# Patient Record
Sex: Female | Born: 1945
Health system: Southern US, Community
[De-identification: ages and names within clinical notes are randomized; demographics above are authoritative.]

## PROBLEM LIST (undated history)

## (undated) DIAGNOSIS — G729 Myopathy, unspecified: Secondary | ICD-10-CM

## (undated) DIAGNOSIS — B019 Varicella without complication: Secondary | ICD-10-CM

## (undated) DIAGNOSIS — R7303 Prediabetes: Secondary | ICD-10-CM

## (undated) DIAGNOSIS — N63 Unspecified lump in unspecified breast: Secondary | ICD-10-CM

## (undated) DIAGNOSIS — Z803 Family history of malignant neoplasm of breast: Secondary | ICD-10-CM

## (undated) DIAGNOSIS — J45909 Unspecified asthma, uncomplicated: Secondary | ICD-10-CM

## (undated) DIAGNOSIS — I10 Essential (primary) hypertension: Secondary | ICD-10-CM

## (undated) DIAGNOSIS — Z800 Family history of malignant neoplasm of digestive organs: Secondary | ICD-10-CM

## (undated) DIAGNOSIS — K635 Polyp of colon: Secondary | ICD-10-CM

## (undated) DIAGNOSIS — E039 Hypothyroidism, unspecified: Secondary | ICD-10-CM

## (undated) DIAGNOSIS — M1990 Unspecified osteoarthritis, unspecified site: Secondary | ICD-10-CM

## (undated) DIAGNOSIS — K219 Gastro-esophageal reflux disease without esophagitis: Secondary | ICD-10-CM

## (undated) DIAGNOSIS — Z8042 Family history of malignant neoplasm of prostate: Secondary | ICD-10-CM

## (undated) DIAGNOSIS — N6452 Nipple discharge: Secondary | ICD-10-CM

## (undated) DIAGNOSIS — R61 Generalized hyperhidrosis: Secondary | ICD-10-CM

## (undated) DIAGNOSIS — C801 Malignant (primary) neoplasm, unspecified: Secondary | ICD-10-CM

## (undated) DIAGNOSIS — R233 Spontaneous ecchymoses: Secondary | ICD-10-CM

## (undated) DIAGNOSIS — J309 Allergic rhinitis, unspecified: Secondary | ICD-10-CM

## (undated) DIAGNOSIS — R238 Other skin changes: Secondary | ICD-10-CM

## (undated) DIAGNOSIS — K069 Disorder of gingiva and edentulous alveolar ridge, unspecified: Secondary | ICD-10-CM

## (undated) DIAGNOSIS — R32 Unspecified urinary incontinence: Secondary | ICD-10-CM

## (undated) HISTORY — DX: Family history of malignant neoplasm of digestive organs: Z80.0

## (undated) HISTORY — DX: Hypothyroidism, unspecified: E03.9

## (undated) HISTORY — DX: Spontaneous ecchymoses: R23.3

## (undated) HISTORY — DX: Essential (primary) hypertension: I10

## (undated) HISTORY — DX: Prediabetes: R73.03

## (undated) HISTORY — DX: Family history of malignant neoplasm of prostate: Z80.42

## (undated) HISTORY — DX: Other skin changes: R23.8

## (undated) HISTORY — DX: Nipple discharge: N64.52

## (undated) HISTORY — DX: Varicella without complication: B01.9

## (undated) HISTORY — DX: Unspecified urinary incontinence: R32

## (undated) HISTORY — DX: Family history of malignant neoplasm of breast: Z80.3

## (undated) HISTORY — DX: Generalized hyperhidrosis: R61

## (undated) HISTORY — DX: Unspecified osteoarthritis, unspecified site: M19.90

## (undated) HISTORY — DX: Unspecified lump in unspecified breast: N63.0

## (undated) HISTORY — DX: Allergic rhinitis, unspecified: J30.9

## (undated) HISTORY — DX: Disorder of gingiva and edentulous alveolar ridge, unspecified: K06.9

## (undated) HISTORY — DX: Polyp of colon: K63.5

## (undated) HISTORY — DX: Gastro-esophageal reflux disease without esophagitis: K21.9

## (undated) HISTORY — PX: SKIN TAG REMOVAL: SHX780

---

## 1978-08-03 HISTORY — PX: TUBAL LIGATION: SHX77

## 1984-08-03 HISTORY — PX: ABDOMINAL HYSTERECTOMY: SHX81

## 1999-07-11 ENCOUNTER — Other Ambulatory Visit: Admission: RE | Admit: 1999-07-11 | Discharge: 1999-07-11 | Payer: Self-pay | Admitting: *Deleted

## 2000-01-14 ENCOUNTER — Encounter: Payer: Self-pay | Admitting: Obstetrics and Gynecology

## 2000-01-14 ENCOUNTER — Encounter: Admission: RE | Admit: 2000-01-14 | Discharge: 2000-01-14 | Payer: Self-pay | Admitting: Obstetrics and Gynecology

## 2000-08-18 ENCOUNTER — Other Ambulatory Visit: Admission: RE | Admit: 2000-08-18 | Discharge: 2000-08-18 | Payer: Self-pay | Admitting: Obstetrics and Gynecology

## 2001-01-27 ENCOUNTER — Encounter: Payer: Self-pay | Admitting: Obstetrics and Gynecology

## 2001-01-27 ENCOUNTER — Encounter: Admission: RE | Admit: 2001-01-27 | Discharge: 2001-01-27 | Payer: Self-pay | Admitting: Obstetrics and Gynecology

## 2001-02-21 ENCOUNTER — Encounter: Payer: Self-pay | Admitting: Internal Medicine

## 2001-02-21 ENCOUNTER — Encounter: Admission: RE | Admit: 2001-02-21 | Discharge: 2001-02-21 | Payer: Self-pay | Admitting: Internal Medicine

## 2001-04-18 ENCOUNTER — Encounter (INDEPENDENT_AMBULATORY_CARE_PROVIDER_SITE_OTHER): Payer: Self-pay | Admitting: *Deleted

## 2001-04-18 ENCOUNTER — Ambulatory Visit (HOSPITAL_COMMUNITY): Admission: RE | Admit: 2001-04-18 | Discharge: 2001-04-18 | Payer: Self-pay | Admitting: Gastroenterology

## 2002-03-15 ENCOUNTER — Encounter: Admission: RE | Admit: 2002-03-15 | Discharge: 2002-03-15 | Payer: Self-pay | Admitting: Obstetrics and Gynecology

## 2002-03-15 ENCOUNTER — Encounter: Payer: Self-pay | Admitting: Obstetrics and Gynecology

## 2002-06-05 ENCOUNTER — Encounter: Payer: Self-pay | Admitting: Internal Medicine

## 2002-06-05 ENCOUNTER — Encounter: Admission: RE | Admit: 2002-06-05 | Discharge: 2002-06-05 | Payer: Self-pay | Admitting: Internal Medicine

## 2002-06-06 ENCOUNTER — Encounter: Admission: RE | Admit: 2002-06-06 | Discharge: 2002-06-06 | Payer: Self-pay | Admitting: Internal Medicine

## 2002-06-06 ENCOUNTER — Encounter: Payer: Self-pay | Admitting: Internal Medicine

## 2002-10-26 ENCOUNTER — Other Ambulatory Visit: Admission: RE | Admit: 2002-10-26 | Discharge: 2002-10-26 | Payer: Self-pay | Admitting: Obstetrics and Gynecology

## 2003-07-11 ENCOUNTER — Encounter: Admission: RE | Admit: 2003-07-11 | Discharge: 2003-07-11 | Payer: Self-pay | Admitting: Obstetrics and Gynecology

## 2004-02-26 ENCOUNTER — Encounter: Admission: RE | Admit: 2004-02-26 | Discharge: 2004-02-26 | Payer: Self-pay | Admitting: Otolaryngology

## 2004-08-18 ENCOUNTER — Encounter: Admission: RE | Admit: 2004-08-18 | Discharge: 2004-08-18 | Payer: Self-pay | Admitting: Internal Medicine

## 2005-03-30 ENCOUNTER — Ambulatory Visit (HOSPITAL_COMMUNITY): Admission: RE | Admit: 2005-03-30 | Discharge: 2005-03-30 | Payer: Self-pay | Admitting: Gastroenterology

## 2005-09-07 ENCOUNTER — Encounter: Admission: RE | Admit: 2005-09-07 | Discharge: 2005-09-07 | Payer: Self-pay | Admitting: Obstetrics and Gynecology

## 2006-03-23 ENCOUNTER — Other Ambulatory Visit: Admission: RE | Admit: 2006-03-23 | Discharge: 2006-03-23 | Payer: Self-pay | Admitting: Obstetrics & Gynecology

## 2006-09-27 ENCOUNTER — Encounter: Admission: RE | Admit: 2006-09-27 | Discharge: 2006-09-27 | Payer: Self-pay | Admitting: Internal Medicine

## 2006-10-14 ENCOUNTER — Encounter: Admission: RE | Admit: 2006-10-14 | Discharge: 2006-10-14 | Payer: Self-pay | Admitting: Internal Medicine

## 2008-04-20 ENCOUNTER — Encounter: Admission: RE | Admit: 2008-04-20 | Discharge: 2008-04-20 | Payer: Self-pay | Admitting: Internal Medicine

## 2008-05-11 ENCOUNTER — Encounter: Admission: RE | Admit: 2008-05-11 | Discharge: 2008-05-11 | Payer: Self-pay | Admitting: Radiology

## 2008-07-09 ENCOUNTER — Encounter (INDEPENDENT_AMBULATORY_CARE_PROVIDER_SITE_OTHER): Payer: Self-pay | Admitting: Surgery

## 2008-07-09 ENCOUNTER — Ambulatory Visit (HOSPITAL_COMMUNITY): Admission: RE | Admit: 2008-07-09 | Discharge: 2008-07-09 | Payer: Self-pay | Admitting: Surgery

## 2008-07-09 HISTORY — PX: BREAST SURGERY: SHX581

## 2008-07-18 ENCOUNTER — Emergency Department (HOSPITAL_COMMUNITY): Admission: EM | Admit: 2008-07-18 | Discharge: 2008-07-18 | Payer: Self-pay | Admitting: Emergency Medicine

## 2009-09-05 ENCOUNTER — Ambulatory Visit (HOSPITAL_COMMUNITY): Admission: RE | Admit: 2009-09-05 | Discharge: 2009-09-05 | Payer: Self-pay | Admitting: Surgery

## 2009-09-05 HISTORY — PX: THIGH / KNEE SOFT TISSUE BIOPSY: SUR151

## 2010-01-02 ENCOUNTER — Encounter: Admission: RE | Admit: 2010-01-02 | Discharge: 2010-01-02 | Payer: Self-pay | Admitting: Family Medicine

## 2010-10-02 ENCOUNTER — Encounter: Payer: Self-pay | Admitting: Pulmonary Disease

## 2010-10-02 DIAGNOSIS — M1990 Unspecified osteoarthritis, unspecified site: Secondary | ICD-10-CM | POA: Insufficient documentation

## 2010-10-02 DIAGNOSIS — E039 Hypothyroidism, unspecified: Secondary | ICD-10-CM | POA: Insufficient documentation

## 2010-10-02 DIAGNOSIS — K219 Gastro-esophageal reflux disease without esophagitis: Secondary | ICD-10-CM | POA: Insufficient documentation

## 2010-10-02 DIAGNOSIS — I10 Essential (primary) hypertension: Secondary | ICD-10-CM | POA: Insufficient documentation

## 2010-10-03 ENCOUNTER — Encounter: Payer: Self-pay | Admitting: Pulmonary Disease

## 2010-10-03 ENCOUNTER — Other Ambulatory Visit: Payer: Self-pay | Admitting: Pulmonary Disease

## 2010-10-03 ENCOUNTER — Ambulatory Visit (INDEPENDENT_AMBULATORY_CARE_PROVIDER_SITE_OTHER)
Admission: RE | Admit: 2010-10-03 | Discharge: 2010-10-03 | Disposition: A | Discharge status: Home / Self Care | Payer: 59 | Source: Ambulatory Visit | Attending: Pulmonary Disease | Admitting: Pulmonary Disease

## 2010-10-03 ENCOUNTER — Institutional Professional Consult (permissible substitution) (INDEPENDENT_AMBULATORY_CARE_PROVIDER_SITE_OTHER): Payer: 59 | Admitting: Pulmonary Disease

## 2010-10-03 DIAGNOSIS — R0602 Shortness of breath: Secondary | ICD-10-CM

## 2010-10-09 NOTE — Assessment & Plan Note (Signed)
Summary: pulm consult/dr onaodowan--[facility]   Visit Type:  Initial Consult Copy to:  Onaodowan  Primary Provider/Referring Provider:  Elias Else MD  CC:  Pulmonary consult. PT  c/o sob w/ exertion, occas wheezing, and dry throat in the am. .  History of Present Illness: 65 yo female with dyspnea.  She noticed problems with her breathing for the past 6 months.  She gets wheezing at times and gets winded after walking about 1/4 mile.  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  He does not have a cough or chest congestion.  She does get phlegm in her throat in the morning.  She will get hay fever, and sinus congestion.  She has never had allergy tests.  She quit smoking in 2010.  She has not had PFT's recently.  She had a chest xray in 2011, but none since.    She denies chest tightness or pain.  She denies palpitations.  She gets a rash over her hands, and says this is from eczema.  She denies abdominal pain, hemoptysis, gland swelling, fever, sweats, or weight loss.  She does snore, but is not sure she has any other trouble with her sleep.  There is no history of pneumonia or TB.  She has never been told she has asthma.  She works as a Location manager for [employer].  She is from [place].  She travelled to [place] last year.  She denies animal exposure.  She denies sick exposures.  Spirometry today: FEV1 1.46(78%), FVC 1.89(79%), FEV1% 77.   Current Medications (verified): 1)  Micardis 40 Mg Tabs (Telmisartan) .... Takes 1 1/2 Tablets Once Daily 2)  Synthroid 75 Mcg Tabs (Levothyroxine Sodium) .... Once Daily 3)  Vitamin D 1200 Mg .... Once A Week 4)  Zyrtec Allergy 10 Mg Tabs (Cetirizine Hcl) .... Once Daily 5)  Coq10 100 Mg Caps (Coenzyme Q10) .... Once Daily 6)  Multivitamins  Tabs (Multiple Vitamin) .... Once Daily 7)  Calcium 500 Mg Tabs (Calcium) .... Once Daily 8)  Amino Acid .... Once  Daily  Allergies: 1)  ! Sulfa 2)  ! Neosporin 3)  ! * Hydrogen Peroxide 4)  ! * Ivp Dye 5)  ! 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 quit 2010. 15 cigs a day. started age 89 occupation: machine operator divorced  Review of Systems       The patient complains of shortness of breath with activity, acid heartburn, indigestion, sore throat, nasal congestion/difficulty breathing through nose, and hand/feet swelling.  The patient denies shortness of breath at rest, productive cough, non-productive cough, coughing up blood, chest pain, irregular heartbeats, loss of appetite, weight change, abdominal pain, difficulty swallowing, tooth/dental problems, headaches, sneezing, itching, ear ache, anxiety, depression, joint stiffness or pain, rash, change in color of mucus, and fever.    Vital Signs:  Patient profile:   65 year old female Height:      62 inches Weight:      211.50 pounds BMI:     38.82 O2 Sat:      100 % on Room air Temp:  97.7 degrees F oral Pulse rate:   66 / minute BP sitting:   134 / 80  (left arm) Cuff size:   large  Vitals Entered By: Carver Fila (October 03, 2010 9:33 AM)  O2 Flow:  Room air CC: Pulmonary consult. PT  c/o sob w/ exertion, occas wheezing, dry throat in the am.  Comments meds and allergies updated Phone number updated Mindy Silva  October 03, 2010 9:34 AM    Physical Exam  General:  normal appearance, healthy appearing, and obese.   Eyes:  PERRLA and EOMI, wear glasses Ears:  TMs intact and clear with normal canals Nose:  clear nasal  discharge, no tenderness Mouth:  MP 3, triangular uvula Neck:  no JVD.   Chest Wall:  no deformities noted Lungs:  clear bilaterally to auscultation and percussion Heart:  regular rate and rhythm, S1, S2 without murmurs, rubs, gallops, or clicks Abdomen:  bowel sounds positive; abdomen soft and non-tender without masses, or organomegaly Msk:  no deformity or scoliosis noted with normal posture Pulses:  pulses normal Extremities:  no clubbing, cyanosis, edema, or deformity noted Neurologic:  normal CN II-XII, gait normal, and strength normal.   Skin:  eczematous rash over hands b/l Cervical Nodes:  no significant adenopathy Psych:  alert and cooperative; normal mood and affect; normal attention span and concentration   Impression & Recommendations:  Problem # 1:  DYSPNEA (ICD-786.05)  Potential causes for this could include asthma in setting of seasonal allergies with prior history of tobacco abuse, diastolic dysfunction with history of hypertension, myopathy and deconditioning.  She did not have evidence for obstruction on spirometry today, but did have suggestion of restrictive defect.  Will arrange for full PFT's to further assess.  Will give her an empiric trial of ventolin.  Will get copy of her recent lab work, and then decide what additional tests need to be performed.  She may need Echo to further assess for diastolic dysfunction.  Will arrange for chest xray today.  Her muscle strength on exam today was normal, and my suspicion for respiratory muscle weakness causing her dyspnea is low.  She may need further evaluation for sleep apnea.  There is nothing in her history to suggest thrombo-embolic disease.  Medications Added to Medication List This Visit: 1)  Micardis 40 Mg Tabs (Telmisartan) .... Takes 1 1/2 tablets once daily 2)  Synthroid 75 Mcg Tabs (Levothyroxine sodium) .... Once daily 3)  Vitamin D 1200 Mg  .... Once a week 4)  Zyrtec Allergy 10 Mg Tabs (Cetirizine hcl) ....  Once daily 5)  Coq10 100 Mg Caps (Coenzyme q10) .... Once daily 6)  Multivitamins Tabs (Multiple vitamin) .... Once daily 7)  Calcium 500 Mg Tabs (Calcium) .... Once daily 8)  Amino Acid  .... Once daily 9)  Ventolin Hfa 108 (90 Base) Mcg/act Aers (Albuterol sulfate) .... Two puffs four times per day as needed  Complete Medication List: 1)  Micardis 40 Mg Tabs (Telmisartan) .... Takes 1 1/2 tablets once daily 2)  Synthroid 75 Mcg Tabs (Levothyroxine sodium) .... Once daily 3)  Vitamin D 1200 Mg  .... Once a week 4)  Zyrtec Allergy 10 Mg Tabs (Cetirizine hcl) .... Once daily 5)  Coq10 100 Mg Caps (Coenzyme q10) .... Once daily 6)  Multivitamins Tabs (Multiple vitamin) .... Once daily 7)  Calcium 500 Mg Tabs (Calcium) .... Once daily 8)  Amino Acid  .... Once daily 9)  Ventolin Hfa 108 (90 Base) Mcg/act  Aers (Albuterol sulfate) .... Two puffs four times per day as needed  Other Orders: Consultation Level V (81191) Spirometry w/Graph (94010) Full Pulmonary Function Test (PFT) T-2 View CXR (71020TC)  Patient Instructions: 1)  Will schedule breathing test (PFT) 2)  Chest xray today 3)  Will get medical records from Dr. Nicholos Johns 4)  Ventolin two puffs four times per day as needed  5)  Follow up in 2 to 3 weeks Prescriptions: VENTOLIN HFA 108 (90 BASE) MCG/ACT AERS (ALBUTEROL SULFATE) two puffs four times per day as needed  #1 x 3   Entered and Authorized by:   Coralyn Helling MD   Signed by:   Coralyn Helling MD on 10/03/2010   Method used:   Electronically to        [facility]  [id number] (ret[facility])       [address]       [place], [place]  [address]       Ph: [phone] or [phone]       Fax: [phone]   RxID:   [id number]

## 2010-10-14 NOTE — Letter (Signed)
Summary: [facility]   Imported By: Kassie Mends 10/10/2010 09:59:47  _____________________________________________________________________  External Attachment:    Type:   Image     Comment:   External Document

## 2010-10-19 LAB — COMPREHENSIVE METABOLIC PANEL
AST: 38 U/L — ABNORMAL HIGH (ref 0–37)
BUN: 13 mg/dL (ref 6–23)
CO2: 29 mEq/L (ref 19–32)
Chloride: 101 mEq/L (ref 96–112)
Creatinine, Ser: 0.98 mg/dL (ref 0.4–1.2)
GFR calc non Af Amer: 58 mL/min — ABNORMAL LOW (ref >60)
Glucose, Bld: 97 mg/dL (ref 70–99)
Total Bilirubin: 1 mg/dL (ref 0.3–1.2)

## 2010-10-19 LAB — CBC
HCT: 44.2 % (ref 36.0–46.0)
Hemoglobin: 14.8 g/dL (ref 12.0–15.0)
MCV: 93.7 fL (ref 78.0–100.0)
RBC: 4.71 MIL/uL (ref 3.87–5.11)
WBC: 5.9 10*3/uL (ref 4.0–10.5)

## 2010-10-20 ENCOUNTER — Ambulatory Visit (INDEPENDENT_AMBULATORY_CARE_PROVIDER_SITE_OTHER): Payer: 59 | Admitting: Pulmonary Disease

## 2010-10-20 ENCOUNTER — Encounter: Payer: Self-pay | Admitting: Pulmonary Disease

## 2010-10-20 DIAGNOSIS — R0602 Shortness of breath: Secondary | ICD-10-CM

## 2010-10-24 ENCOUNTER — Telehealth: Payer: Self-pay | Admitting: Pulmonary Disease

## 2010-10-24 NOTE — Telephone Encounter (Signed)
Forwarded to Dr. Sood for review. °

## 2010-10-30 ENCOUNTER — Ambulatory Visit (INDEPENDENT_AMBULATORY_CARE_PROVIDER_SITE_OTHER): Payer: 59 | Admitting: Pulmonary Disease

## 2010-10-30 DIAGNOSIS — R0602 Shortness of breath: Secondary | ICD-10-CM

## 2010-10-30 LAB — PULMONARY FUNCTION TEST

## 2010-10-30 NOTE — Progress Notes (Signed)
PFT done today. 

## 2010-10-30 NOTE — Assessment & Plan Note (Signed)
Summary: 2-3 WEEK FOLLOW-UP/LED   Copy to:  [place]  Primary Provider/Referring Provider:  Elias Else MD  CC:  follow up. Pt states her breathing has unchanged. Pt states she could here herself wheezing yesterday. Marland Kitchen  History of Present Illness: 65 yo female former smoker with dyspnea.  Her breathing is about the same.  She gets occasional wheeze, but has not been using her inhaler.  She does not have much cough. Her sinuses are doing okay.  She gets occasional leg swelling.  Chest xray from last visit was unremarkable.  She did not have PFT done.  Have not received medical records from Dr. Benjaman Pott office yet.  Current Medications (verified): 1)  Micardis 40 Mg Tabs (Telmisartan) .... Takes 1 1/2 Tablets Once Daily 2)  Synthroid 75 Mcg Tabs (Levothyroxine Sodium) .... Once Daily 3)  Vitamin D 1200 Mg .... Once A Week 4)  Zyrtec Allergy 10 Mg Tabs (Cetirizine Hcl) .... Once Daily 5)  Coq10 100 Mg Caps (Coenzyme Q10) .... Once Daily 6)  Multivitamins  Tabs (Multiple Vitamin) .... Once Daily 7)  Calcium 500 Mg Tabs (Calcium) .... Once Daily 8)  Amino Acid .... Once Daily 9)  Ventolin Hfa 108 (90 Base) Mcg/act Aers (Albuterol Sulfate) .... Two Puffs Four Times Per Day As Needed  Allergies (verified): 1)  ! Sulfa 2)  ! Neosporin 3)  ! * Hydrogen Peroxide 4)  ! * Ivp Dye 5)  ! * Latex  Past History:  Past Medical History: Reviewed history from 10/03/2010 and no changes required. 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 hysterectomy tubal ligation Muscle biopsy Right breast mass >> benign  Vital Signs:  Patient profile:   65 year old female Height:      62 inches Weight:      209.13 pounds BMI:     38.39 O2 Sat:      100 % on Room air Temp:     97.5 degrees F oral Pulse rate:   66 / minute BP sitting:   122 / 76  (right  arm) Cuff size:   large  Vitals Entered By: Carver Fila (October 20, 2010 9:56 AM)  O2 Flow:  Room air CC: follow up. Pt states her breathing has unchanged. Pt states she could here herself wheezing yesterday.  Comments meds and allergies updated Phone number updated Carver Fila  October 20, 2010 9:59 AM    Physical Exam  General:  normal appearance, healthy appearing, and obese.   Nose:  clear nasal discharge, no tenderness Mouth:  MP 3, triangular uvula Neck:  no JVD.   Lungs:  clear bilaterally to auscultation and percussion Heart:  regular rate and rhythm, S1, S2 without murmurs, rubs, gallops, or clicks Extremities:  no clubbing, cyanosis, edema, or deformity noted Neurologic:  normal CN II-XII and strength normal.   Cervical Nodes:  no significant adenopathy   Impression & Recommendations:  Problem # 1:  DYSPNEA (ICD-786.05)  Potential causes for this could include asthma in setting of seasonal allergies with prior history of tobacco abuse, diastolic dysfunction with history of hypertension, and deconditioning.  She did not get her PFT done yet.  Will reschedule this.  She has not tried using her inhaler yet, but I have advised her to try using this more.  I see if we can get her records from PCP office sent.    If her pulmonary  eval is unrevealing for a cause of her dyspnea, then she may need further cardiac evaluation.  Complete Medication List: 1)  Micardis 40 Mg Tabs (Telmisartan) .... Takes 1 1/2 tablets once daily 2)  Synthroid 75 Mcg Tabs (Levothyroxine sodium) .... Once daily 3)  Vitamin D 1200 Mg  .... Once a week 4)  Zyrtec Allergy 10 Mg Tabs (Cetirizine hcl) .... Once daily 5)  Coq10 100 Mg Caps (Coenzyme q10) .... Once daily 6)  Multivitamins Tabs (Multiple vitamin) .... Once daily 7)  Calcium 500 Mg Tabs (Calcium) .... Once daily 8)  Amino Acid  .... Once daily 9)  Ventolin Hfa 108 (90 Base) Mcg/act Aers (Albuterol sulfate) .... Two puffs four times per day  as needed  Other Orders: Est. Patient Level III (21308)  Patient Instructions: 1)  Try using ventolin when you get wheezing 2)  Will schedule breathing test (PFT) 3)  Follow up in 2 months

## 2010-12-16 NOTE — Op Note (Signed)
Lisa Horton, Lisa Horton               ACCOUNT NO.:  [id number]   MEDICAL RECORD NO.:  [id number]          PATIENT TYPE:  AMB   LOCATION:  SDS                          FACILITY:  [facility]   PHYSICIAN:  Thomas A. Cornett, M.D.DATE OF BIRTH:  11/12/1945   DATE OF PROCEDURE:  07/09/2008  DATE OF DISCHARGE:  07/09/2008                               OPERATIVE REPORT   PREOPERATIVE DIAGNOSIS:  Right breast mass.   POSTOPERATIVE DIAGNOSIS:  Right breast mass.   PROCEDURE:  Right breast needle-localized lumpectomy.   SURGEON:  Dortha Schwalbe, MD   ASSISTANT:  Russella Dar, NP   ANESTHESIA:  LMA 0.25% Sensorcaine local with epinephrine.   ESTIMATED BLOOD LOSS:  20 mL.   SPECIMEN:  Right breast tissue with localizing wire and clip, verified  by the Radiologist to be adequate.   DRAINS:  None.   INDICATIONS OF THE PROCEDURE:  The patient is a 65 year old female that  had a core biopsy done of a mass by Dr. Tor Netters of Radiology.  This came  in as a papular lesion and excision was recommended for further  diagnosis.  She presented today for needle-localized right breast  excisional lumpectomy.   DESCRIPTION OF PROCEDURE:  After undergoing needle localization with the  Radiologist, the patient was brought to the operating suite.  After  induction of LMA anesthesia, the right breast was prepped and draped in  sterile fashion.  The wire exited the breast in the right lower outer  quadrant.  A curvilinear incision was made with 0.25% Sensorcaine into  the skin.  Wire was brought out of the incision and all tissue around  the wire was excised.  Of note, there was significant fibrocystic change  in how we did remove part of the breast cyst with this area.  Radiograph  revealed the clip and wire to be in specimen.  Irrigation was used.  Hemostasis was achieved with cautery.  We then  closed the wound in layers, the deep layer with 3-0 Vicryl and a  subsequent 4-0 Monocryl stitch.  Dermabond  was applied.  All final  counts of sponge, needle and instruments found to be correct at this  portion of the case.  The patient was then extubated and taken to the  recovery room in satisfactory condition.      Thomas A. Cornett, M.D.  Electronically Signed     TAC/MEDQ  D:  07/09/2008  T:  07/10/2008  Job:  [id number]   cc:   Candyce Churn. Allyne Gee, M.D.  Lum Keas, MD

## 2010-12-19 NOTE — Op Note (Signed)
Lisa Horton, Lisa Horton               ACCOUNT NO.:  [id number]   MEDICAL RECORD NO.:  [id number]          PATIENT TYPE:  AMB   LOCATION:  ENDO                         FACILITY:  [facility]   PHYSICIAN:  Anselmo Rod, M.D.  DATE OF BIRTH:  1946/04/09   DATE OF PROCEDURE:  03/30/2005  DATE OF DISCHARGE:                                 OPERATIVE REPORT   PROCEDURE PERFORMED:  Screening colonoscopy.   ENDOSCOPIST:  Charna Elizabeth, M.D.   INSTRUMENT USED:  Olympus video colonoscope.   INDICATIONS FOR PROCEDURE:  The patient is a 65 year old African American  female with personal history of a tubular adenoma removed in the past.  Undergoing repeat colonoscopy to rule out recurrent polyps.   PREPROCEDURE PREPARATION:  Informed consent was procured from the patient.  The patient was fasted for eight hours prior to the procedure and prepped  with a bottle of magnesium citrate and a gallon of GoLYTELY the night prior  to the procedure.  The risks and benefits of the procedure including a 10%  miss rate for colon polyps or cancers was discussed with the patient as  well.   PREPROCEDURE PHYSICAL:  The patient had stable vital signs.  Neck supple.  Chest clear to auscultation.  S1 and S2 regular.  Abdomen soft with normal  bowel sounds.   DESCRIPTION OF PROCEDURE:  The patient was placed in left lateral decubitus  position and sedated with 75 mg of Demerol and 7.5 mg of Versed in slow  incremental doses.  Once the patient was adequately sedated and maintained  on low flow oxygen and continuous cardiac monitoring, the Olympus video  colonoscope was advanced from the rectum to the cecum.  The appendicular  orifice and ileocecal valve were clearly visualized and photographed.  No  masses, polyps, erosions, ulcerations or diverticula were seen.  Retroflexion in the rectum revealed no abnormalities. The patient tolerated  the procedure well without complication.   IMPRESSION:  Normal colonoscopy up to  the cecum.   RECOMMENDATIONS:  1.  Continue high fiber diet with liberal fluid intake.  2.  Repeat colonoscopy in the next five years unless the patient develops      any abnormal symptoms in the interim.  3.  Outpatient followup as need arises in the future.      Anselmo Rod, M.D.  Electronically Signed     JNM/MEDQ  D:  03/30/2005  T:  03/30/2005  Job:  [id number]   cc:   Candyce Churn. Allyne Gee, M.D.  [address]  [address]  [place]  [place] [address]  Fax: [phone]

## 2010-12-19 NOTE — Procedures (Signed)
[place]. [facility]  Patient:    Lisa Horton, Lisa Horton Visit Number: [id number] MRN: [id number]          Service Type: END Location: ENDO Attending Physician:  Charna Elizabeth Dictated by:   Anselmo Rod, M.D. Proc. Date: 04/18/01 Admit Date:  04/18/2001   CC:         Velna Hatchet, M.D.   Procedure Report  DATE OF BIRTH:  04-19-1946  REFERRING PHYSICIAN:  Velna Hatchet, M.D.  PROCEDURE PERFORMED:  Esophagogastroduodenoscopy with biopsies.  ENDOSCOPIST:  Anselmo Rod, M.D.  INSTRUMENT USED:  Olympus video panendoscope.  INDICATIONS FOR PROCEDURE:  Epigastric pain and guaiac positive stools in a 65 year old African-American female rule out peptic ulcer disease, esophagitis, gastritis, etc.  PREPROCEDURE PREPARATION:  Informed consent was procured from the patient. The patient was fasted for eight hours prior to the procedure.  PREPROCEDURE PHYSICAL:  The patient had stable vital signs.  Neck supple. Chest clear to auscultation.  S1, S2 regular.  Abdomen soft with normal abdominal bowel sounds.  DESCRIPTION OF PROCEDURE:  The patient was placed in left lateral decubitus position and sedated with 50 mg of Demerol and 5 mg of Versed intravenously. Once the patient was adequately sedated and maintained on low-flow oxygen and continuous cardiac monitoring, the Olympus video panendoscope was advanced through the mouthpiece, over the tongue, into the esophagus under direct vision.  The entire esophagus appeared normal without evidence of ring, stricture, masses, lesions, esophagitis or Barretts mucosa.  The scope was then advanced into the stomach.  There was mild antral gastritis.  No ulcers, masses or polyps were seen.  The proximal small bowel appeared normal. IMPRESSION:  Mild antral gastritis.  Otherwise normal EGD.  RECOMMENDATION:  Proceed with a colonoscopy at this time. Dictated by:   Anselmo Rod, M.D. Attending Physician:  Charna Elizabeth DD:  04/18/01 TD:  04/19/01 Job: [id number]

## 2010-12-19 NOTE — Procedures (Signed)
[place]. [facility]  Patient:    Lisa Horton, Lisa Horton Visit Number: [id number] MRN: [id number]          Service Type: END Location: ENDO Attending Physician:  Charna Elizabeth Dictated by:   Anselmo Rod, M.D. Proc. Date: 04/18/01 Admit Date:  04/18/2001   CC:         Velna Hatchet, M.D.   Procedure Report  DATE OF BIRTH:  08-27-1945  REFERRING PHYSICIAN:  Velna Hatchet, M.D.  PROCEDURE PERFORMED:  Colonoscopy with hot biopsy x 1.  ENDOSCOPIST:  Anselmo Rod, M.D.  INSTRUMENT USED:  Olympus video colonoscope.  INDICATIONS FOR PROCEDURE:  Guaiac positive stools in a 65 year old African-American female.  Rule out colonic polyps, masses, hemorrhoids, etc.  PREPROCEDURE PREPARATION:  Informed consent was procured from the patient. The patient was fasted for eight hours prior to the procedure and prepped with a bottle of magnesium citrate and a gallon of NuLytely the night prior to the procedure.  PREPROCEDURE PHYSICAL:  The patient had stable vital signs.  Neck supple. Chest clear to auscultation.  S1, S2 regular.  Abdomen soft with normal abdominal bowel sounds.  DESCRIPTION OF PROCEDURE:  The patient was placed in the left lateral decubitus position and sedated with an additional 20 mg of Demerol and 2 mg of Versed intravenously.  Once the patient was adequately sedated and maintained on low-flow oxygen and continuous cardiac monitoring, the Olympus video colonoscope was advanced from the rectum to the cecum without difficulty.  A small flat polyp was biopsied at 50 cm.  No large masses were seen.  There were small internal hemorrhoids appreciated on retroflexion.  The rest of the colonic mucosa appeared healthy.  IMPRESSION: 1. One small flat polyp biopsied up 50 cm with hot biopsy forceps. 2. Small internal hemorrhoids. 3. Otherwise healthy-appearing colon.  RECOMMENDATIONS: 1. Await pathology results. 2. A high fiber diet. 2.  Repeat guaiac testing on an outpatient basis.Dictated by:   Anselmo Rod, M.D. Attending Physician:  Charna Elizabeth DD:  04/18/01 TD:  04/19/01 Job: [id number]

## 2010-12-25 ENCOUNTER — Other Ambulatory Visit: Payer: Self-pay | Admitting: Gastroenterology

## 2010-12-26 ENCOUNTER — Ambulatory Visit
Admission: RE | Admit: 2010-12-26 | Discharge: 2010-12-26 | Disposition: A | Discharge status: Home / Self Care | Payer: 59 | Source: Ambulatory Visit | Attending: Gastroenterology | Admitting: Gastroenterology

## 2010-12-31 ENCOUNTER — Encounter: Payer: Self-pay | Admitting: Pulmonary Disease

## 2011-01-06 ENCOUNTER — Encounter: Payer: Self-pay | Admitting: Pulmonary Disease

## 2011-01-19 ENCOUNTER — Other Ambulatory Visit: Payer: Self-pay | Admitting: Plastic Surgery

## 2011-02-09 ENCOUNTER — Other Ambulatory Visit: Payer: Self-pay | Admitting: Gastroenterology

## 2011-02-16 ENCOUNTER — Ambulatory Visit
Admission: RE | Admit: 2011-02-16 | Discharge: 2011-02-16 | Disposition: A | Discharge status: Home / Self Care | Payer: 59 | Source: Ambulatory Visit | Attending: Gastroenterology | Admitting: Gastroenterology

## 2011-02-23 ENCOUNTER — Encounter (INDEPENDENT_AMBULATORY_CARE_PROVIDER_SITE_OTHER): Payer: Self-pay

## 2011-02-24 ENCOUNTER — Ambulatory Visit (INDEPENDENT_AMBULATORY_CARE_PROVIDER_SITE_OTHER): Payer: 59 | Admitting: General Surgery

## 2011-02-24 ENCOUNTER — Encounter (INDEPENDENT_AMBULATORY_CARE_PROVIDER_SITE_OTHER): Payer: Self-pay | Admitting: General Surgery

## 2011-02-24 VITALS — BP 130/84 | HR 68 | Temp 96.5°F | Ht 62.25 in | Wt 208.4 lb

## 2011-02-24 DIAGNOSIS — K8019 Calculus of gallbladder with other cholecystitis with obstruction: Secondary | ICD-10-CM | POA: Insufficient documentation

## 2011-02-24 NOTE — Progress Notes (Signed)
Lisa Horton is a 65 y.o. female.    Chief Complaint  Patient presents with  . Other    new pt- eval gb with stones    HPI HPI The patient has had known gallbladder disease or least 20 years. This is dated back to 71. Recently she had some blood work done for an insurance examination which demonstrated abnormal liver function tests. From her primary care physician she was referred back to her gastroenterologist who performed a colonoscopy Dr. Charna Elizabeth. Dr. Memory Argue suspected possible gallbladder disease and therefore an ultrasound ordered. This demonstrated gallstones but no evidence of acute cholecystitis and a normal common bile duct.  In discussing this with the patient she's had intermittent abdominal pain in the epigastrium and right upper quadrant associated with no fevers and chills. She has had on occasion some dark colored urine and very light stools possibly secondary to a partially and intermittently obstructed common bile duct. She now comes in for evaluation for surgical treatmen  Past Medical History  Diagnosis Date  . Acid reflux   . Breast lump   . Chicken pox   . Measles   . Sinusitis   . Colon polyp   . Hypertension   . Arthritis   . Nipple discharge   . Night sweats   . Gum disease   . Thyroid disease     hypothyroidism  . Incontinence   . Bruises easily     Past Surgical History  Procedure Date  . Tubal ligation   . Abdominal hysterectomy   . Skin tag removal     brow and lid  . Thigh / knee soft tissue biopsy   . Breast surgery     mass removal    Family History  Problem Relation Age of Onset  . Hypertension Mother   . Cancer Mother     stomach  . Hypertension Father   . Cancer Father     prostate  . Hypertension Sister   . Hypertension Brother   . Hypertension Sister     Social History History  Substance Use Topics  . Smoking status: Former Games developer  . Smokeless tobacco: Not on file  . Alcohol Use: Yes    Allergies  Allergen  Reactions  . Adhesive (Tape)   . Hydrogen Peroxide   . Latex   . Sulfonamide Derivatives   . Triple Antibiotic     Current Outpatient Prescriptions  Medication Sig Dispense Refill  . CALCIUM PO Take 1 tablet by mouth daily.        . cetirizine (ZYRTEC) 10 MG tablet Take 10 mg by mouth daily.        . Esomeprazole Magnesium (NEXIUM PO) Take 40 mg by mouth daily.       . Levothyroxine Sodium (SYNTHROID PO) Take 75 mg by mouth daily.       . Multiple Vitamin (MULTIVITAMIN) tablet Take 1 tablet by mouth daily.        . Olmesartan Medoxomil (BENICAR PO) Take 40 mg by mouth daily.       . Coenzyme Q10 (CO Q 10 PO) Take by mouth.        Marland Kitchen PROPOFOL IV Inject 140 mg into the vein.          Review of Systems Review of Systems  Constitutional: Negative for fever, chills and weight loss.  Gastrointestinal: Positive for nausea and abdominal pain (epigastrium and RUQ). Negative for vomiting and melena.    Physical Exam Physical Exam  Constitutional: She is oriented to person, place, and time. She appears well-developed and well-nourished.  HENT:  Head: Normocephalic and atraumatic.  Eyes: Conjunctivae and EOM are normal. Pupils are equal, round, and reactive to light. No scleral icterus.  Neck: Normal range of motion. Neck supple.  Cardiovascular: Normal rate, regular rhythm and normal heart sounds.   Respiratory: Effort normal and breath sounds normal.  GI: Soft. She exhibits no mass. There is tenderness (mild epigastic and RUQ tenderness). There is no rebound and no guarding.  Musculoskeletal: Normal range of motion.  Neurological: She is alert and oriented to person, place, and time.  Skin: Skin is warm and dry.  Psychiatric: She has a normal mood and affect. Her behavior is normal. Judgment and thought content normal.     Blood pressure 130/84, pulse 68, temperature 96.5 F (35.8 C), height 5' 2.25" (1.581 m), weight 208 lb 6.4 oz (94.53 kg).  Assessment/Plan Based on the  patient's presentation, ultrasound report, she has symptomatic cholelithiasis.  A talk with the patient in detail about the risk and benefits of surgery and she wished to proceed as possible. Quinita Kostelecky III,Rhonin Trott O 02/24/2011, 11:43 AM

## 2011-03-03 ENCOUNTER — Encounter (INDEPENDENT_AMBULATORY_CARE_PROVIDER_SITE_OTHER): Payer: Self-pay

## 2011-03-10 ENCOUNTER — Encounter (HOSPITAL_COMMUNITY)
Admission: RE | Admit: 2011-03-10 | Discharge: 2011-03-10 | Disposition: A | Discharge status: Home / Self Care | Payer: 59 | Source: Ambulatory Visit | Attending: General Surgery | Admitting: General Surgery

## 2011-03-10 LAB — DIFFERENTIAL
Basophils Relative: 1 % (ref 0–1)
Monocytes Absolute: 0.3 10*3/uL (ref 0.1–1.0)
Monocytes Relative: 6 % (ref 3–12)
Neutro Abs: 2.7 10*3/uL (ref 1.7–7.7)

## 2011-03-10 LAB — COMPREHENSIVE METABOLIC PANEL
ALT: 28 U/L (ref 0–35)
Alkaline Phosphatase: 64 U/L (ref 39–117)
BUN: 12 mg/dL (ref 6–23)
CO2: 28 mEq/L (ref 19–32)
Calcium: 9.8 mg/dL (ref 8.4–10.5)
GFR calc Af Amer: >60 mL/min (ref >60)
GFR calc non Af Amer: 55 mL/min — ABNORMAL LOW (ref >60)
Glucose, Bld: 103 mg/dL — ABNORMAL HIGH (ref 70–99)
Potassium: 4.1 mEq/L (ref 3.5–5.1)
Sodium: 141 mEq/L (ref 135–145)

## 2011-03-10 LAB — SURGICAL PCR SCREEN: MRSA, PCR: NEGATIVE

## 2011-03-10 LAB — CBC
HCT: 40.1 % (ref 36.0–46.0)
Hemoglobin: 13.2 g/dL (ref 12.0–15.0)
MCH: 30.4 pg (ref 26.0–34.0)
RBC: 4.34 MIL/uL (ref 3.87–5.11)

## 2011-03-13 ENCOUNTER — Encounter (INDEPENDENT_AMBULATORY_CARE_PROVIDER_SITE_OTHER): Payer: Self-pay

## 2011-03-17 ENCOUNTER — Encounter (INDEPENDENT_AMBULATORY_CARE_PROVIDER_SITE_OTHER): Payer: Self-pay

## 2011-03-17 ENCOUNTER — Ambulatory Visit (HOSPITAL_COMMUNITY)
Admission: RE | Admit: 2011-03-17 | Discharge: 2011-03-17 | Disposition: A | Discharge status: Home / Self Care | Payer: 59 | Source: Ambulatory Visit | Attending: General Surgery | Admitting: General Surgery

## 2011-03-17 ENCOUNTER — Other Ambulatory Visit (INDEPENDENT_AMBULATORY_CARE_PROVIDER_SITE_OTHER): Payer: Self-pay | Admitting: General Surgery

## 2011-03-17 DIAGNOSIS — J449 Chronic obstructive pulmonary disease, unspecified: Secondary | ICD-10-CM | POA: Insufficient documentation

## 2011-03-17 DIAGNOSIS — K8010 Calculus of gallbladder with chronic cholecystitis without obstruction: Secondary | ICD-10-CM | POA: Insufficient documentation

## 2011-03-17 DIAGNOSIS — E669 Obesity, unspecified: Secondary | ICD-10-CM | POA: Insufficient documentation

## 2011-03-17 DIAGNOSIS — J4489 Other specified chronic obstructive pulmonary disease: Secondary | ICD-10-CM | POA: Insufficient documentation

## 2011-03-17 DIAGNOSIS — Z01812 Encounter for preprocedural laboratory examination: Secondary | ICD-10-CM | POA: Insufficient documentation

## 2011-03-17 DIAGNOSIS — I10 Essential (primary) hypertension: Secondary | ICD-10-CM | POA: Insufficient documentation

## 2011-03-17 DIAGNOSIS — Z01810 Encounter for preprocedural cardiovascular examination: Secondary | ICD-10-CM | POA: Insufficient documentation

## 2011-03-17 DIAGNOSIS — K219 Gastro-esophageal reflux disease without esophagitis: Secondary | ICD-10-CM | POA: Insufficient documentation

## 2011-03-17 HISTORY — PX: CHOLECYSTECTOMY: SHX55

## 2011-03-19 ENCOUNTER — Telehealth (INDEPENDENT_AMBULATORY_CARE_PROVIDER_SITE_OTHER): Payer: Self-pay | Admitting: General Surgery

## 2011-03-24 NOTE — Op Note (Signed)
Lisa Horton, Lisa Horton               ACCOUNT NO.:  [id number]  MEDICAL RECORD NO.:  [id number]  LOCATION:  [facility]                         FACILITY:  [facility]  PHYSICIAN:  Cherylynn Ridges, M.D.    DATE OF BIRTH:  [birth year]  DATE OF PROCEDURE:  03/17/2011 DATE OF DISCHARGE:                              OPERATIVE REPORT   PREOPERATIVE DIAGNOSIS:  Symptomatic cholelithiasis.  POSTOPERATIVE DIAGNOSIS:  Symptomatic cholelithiasis.  PROCEDURE:  Laparoscopic cholecystectomy.  SURGEON:  Cherylynn Ridges, MD  ANESTHESIA:  General endotracheal.  ESTIMATED BLOOD LOSS:  Less than 20 mL.  COMPLICATIONS:  None.  CONDITION:  Stable.  FINDINGS:  Chronic cholecystitis and intraoperative cholangiogram was not performed.  Anatomy was well seen.  There were some debris at the cut edge of the cystic duct.  INDICATIONS FOR OPERATION:  The patient is a 65 year old female with known gallstones and symptoms related to gallstones who now comes in for an elective laparoscopic cholecystectomy.  OPERATION:  The patient was taken to the operating room and placed on table in supine position.  After an adequate general endotracheal anesthetic was administered, she was prepped and draped in usual sterile manner exposing the midline.  A supraumbilical midline incision was made using #15 blade taken down to the midline fascia.  We grabbed the edges of the fascia with Kocher clamps, then incised between the fascia using 15 blade.  We grabbed the edges and then tented up as we bluntly dissected down into the peritoneal cavity with Kelly clamp.  Once this was done, a pursestring suture of 0 Vicryl was passed around the fascial opening, then we passed a Hasson cannula into the peritoneal cavity.  We were able to insufflate carbon dioxide gas up to a maximal intra-abdominal pressure of 15 mmHg. Once this was done, we inserted the cam with the attached camera light source and found that there were some omental  adhesions around the cannula.  We were subsequently able to place the patient in reverse Trendelenburg position with left side tilted down.  We placed two right upper quadrant 5-mm cannulas.  A 10-11 mm subxiphoid cannula was passed and we subsequently put the camera up into that port inspected down towards the umbilicus and saw the omental wrappings around the cannula. We were able to get the cannula unwrapped around the omentum.  There was no bleeding.  We subsequently went ahead and dissected out the gallbladder.  We retracted the gallbladder towards the anterior abdominal wall in the right upper quadrant.  Because of the patient's shortened torso, we had a small space to work in, however, we were able to retract the gallbladder which was somewhat intrahepatic.  We dissected out the peritoneum overlying the triangle of Calot and hepatoduodenal triangle, could clearly see the cystic duct and the cystic artery.  360-degree circumferential critical windows were dissected away.  We were able to see around the cystic duct and the artery.  We clipped the artery proximally and distally times two and then we also clipped the cystic duct distally x3 and proximally x1 and transected both.  At the cut edge of the cystic duct, were some debris impregnated stones.  We had to dissect  out the gallbladder with minimal difficulty although there was some spillage of bile through the process.  We retrieved the dissected out gallbladder from the supraumbilical site with an EndoCatch bag. There was no bleeding from the gallbladder bed.  The small amount of bile that was spilled was irrigated out with saline solution.  We aspirated all fluid and gas from above the liver and then removed all cannulas.  The supraumbilical fascial site was closed using a pursestring suture which was in place.  We injected 0.25% Marcaine with epi at all sites. We then closed the supraumbilical and subxiphoid skin sites  using running subcuticular stitch of 4-0 Monocryl.  All counts were correct including needles, sponges and instruments.  We used Dermabond, Steri- Strips and Tegaderm to complete all dressings.  It should be said that prior to procedure, a proper time-out was performed identifying the patient and the procedure to be performed.     Cherylynn Ridges, M.D.     JOW/MEDQ  D:  03/17/2011  T:  03/17/2011  Job:  [id number]  cc:   __________Dr. Fulton Mole  Electronically Signed by Jimmye Norman M.D. on 03/24/2011 12:04:04 AM

## 2011-04-07 ENCOUNTER — Encounter (INDEPENDENT_AMBULATORY_CARE_PROVIDER_SITE_OTHER): Payer: Self-pay | Admitting: General Surgery

## 2011-04-07 ENCOUNTER — Ambulatory Visit (INDEPENDENT_AMBULATORY_CARE_PROVIDER_SITE_OTHER): Payer: 59 | Admitting: General Surgery

## 2011-04-07 VITALS — BP 142/86 | HR 80

## 2011-04-07 DIAGNOSIS — Z09 Encounter for follow-up examination after completed treatment for conditions other than malignant neoplasm: Secondary | ICD-10-CM

## 2011-04-07 NOTE — Progress Notes (Signed)
HPI The patient comes in status post laparoscopic cholecystectomy on March 30, 2000. She was doing well.  PE On examination today her wounds are healing well with no evidence of infection. There is no evidence of any hernias  Studiy review None.  Assessment Status post laparoscopic cholecystectomy doing well. The patient is having some diarrhea associated with the procedure. This should resolve over time however if not she may require some Cholestyramine.  Plan See the patient again on a p.r.n. basis.

## 2011-05-08 LAB — CBC
Hemoglobin: 14.7 g/dL (ref 12.0–15.0)
MCHC: 34 g/dL (ref 30.0–36.0)
Platelets: 146 10*3/uL — ABNORMAL LOW (ref 150–400)
RBC: 4.73 MIL/uL (ref 3.87–5.11)
RDW: 13.6 % (ref 11.5–15.5)
RDW: 13.8 % (ref 11.5–15.5)
WBC: 6.6 10*3/uL (ref 4.0–10.5)

## 2011-05-08 LAB — DIFFERENTIAL
Basophils Absolute: 0 10*3/uL (ref 0.0–0.1)
Basophils Absolute: 0.1 10*3/uL (ref 0.0–0.1)
Basophils Relative: 2 % — ABNORMAL HIGH (ref 0–1)
Lymphocytes Relative: 44 % (ref 12–46)
Lymphs Abs: 2.9 10*3/uL (ref 0.7–4.0)
Monocytes Absolute: 0.3 10*3/uL (ref 0.1–1.0)
Monocytes Absolute: 0.4 10*3/uL (ref 0.1–1.0)
Neutro Abs: 2.5 10*3/uL (ref 1.7–7.7)
Neutro Abs: 3.1 10*3/uL (ref 1.7–7.7)
Neutrophils Relative %: 46 % (ref 43–77)

## 2011-05-08 LAB — BASIC METABOLIC PANEL
BUN: 12 mg/dL (ref 6–23)
CO2: 28 mEq/L (ref 19–32)
Calcium: 9.3 mg/dL (ref 8.4–10.5)
Creatinine, Ser: 0.95 mg/dL (ref 0.4–1.2)
Glucose, Bld: 78 mg/dL (ref 70–99)

## 2011-09-15 ENCOUNTER — Emergency Department (HOSPITAL_COMMUNITY): Payer: 59

## 2011-09-15 ENCOUNTER — Emergency Department (HOSPITAL_COMMUNITY)
Admission: EM | Admit: 2011-09-15 | Discharge: 2011-09-15 | Disposition: A | Discharge status: Home / Self Care | Payer: 59 | Attending: Emergency Medicine | Admitting: Emergency Medicine

## 2011-09-15 ENCOUNTER — Encounter (HOSPITAL_COMMUNITY): Payer: Self-pay

## 2011-09-15 DIAGNOSIS — S161XXA Strain of muscle, fascia and tendon at neck level, initial encounter: Secondary | ICD-10-CM

## 2011-09-15 DIAGNOSIS — M129 Arthropathy, unspecified: Secondary | ICD-10-CM | POA: Insufficient documentation

## 2011-09-15 DIAGNOSIS — S139XXA Sprain of joints and ligaments of unspecified parts of neck, initial encounter: Secondary | ICD-10-CM | POA: Insufficient documentation

## 2011-09-15 DIAGNOSIS — K219 Gastro-esophageal reflux disease without esophagitis: Secondary | ICD-10-CM | POA: Insufficient documentation

## 2011-09-15 DIAGNOSIS — M542 Cervicalgia: Secondary | ICD-10-CM | POA: Insufficient documentation

## 2011-09-15 DIAGNOSIS — Z79899 Other long term (current) drug therapy: Secondary | ICD-10-CM | POA: Insufficient documentation

## 2011-09-15 DIAGNOSIS — I10 Essential (primary) hypertension: Secondary | ICD-10-CM | POA: Insufficient documentation

## 2011-09-15 DIAGNOSIS — E039 Hypothyroidism, unspecified: Secondary | ICD-10-CM | POA: Insufficient documentation

## 2011-09-15 MED ORDER — HYDROCODONE-ACETAMINOPHEN 5-325 MG PO TABS: 1.0000 | ORAL_TABLET | ORAL | Status: AC | PRN

## 2011-09-15 MED ORDER — HYDROCODONE-ACETAMINOPHEN 5-325 MG PO TABS
1.0000 | ORAL_TABLET | Freq: Once | ORAL | Status: AC
  Administered 2011-09-15: 1 via ORAL
  Filled 2011-09-15: qty 1

## 2011-09-15 MED ORDER — CYCLOBENZAPRINE HCL 10 MG PO TABS: 10.0000 mg | ORAL_TABLET | Freq: Two times a day (BID) | ORAL | Status: AC | PRN

## 2011-09-15 NOTE — Discharge Instructions (Signed)
Cervical Sprain and Strain A cervical sprain is an injury to the neck. The injury can include either over-stretching or even small tears in the ligaments that hold the bones of the neck in place. A strain affects muscles and tendons. Minor injuries usually only involve ligaments and muscles. Because the different parts of the neck are so close together, more severe injuries can involve both sprain and strain. These injuries can affect the muscles, ligaments, tendons, discs, and nerves in the neck. CAUSES  An injury may be the result of a direct blow or from certain habits that can lead to the symptoms noted above.  Injury from:   Contact sports (such as football, rugby, wrestling, hockey, auto racing, gymnastics, diving, martial arts, and boxing).   Motor vehicle accidents.   Whiplash injuries (see image at right). These are common. They occur when the neck is forcefully whipped or forced backward and/or forward.   Falls.   Lifestyle or awkward postures:   Cradling a telephone between the ear and shoulder.   Sitting in a chair that offers no support.   Working at an ill-designed computer station.   Activities that require hours of repeated or long periods of looking up (stretching the neck backward) or looking down (bending the head/neck forward).  SYMPTOMS   Pain, soreness, stiffness, or burning sensation in the front, back, or sides of the neck. This may develop immediately after injury. Onset of discomfort may also develop slowly and not begin for 24 hours or more.   Shoulder and/or upper back pain.   Limits to the normal movement of the neck.   Headache.   Dizziness.   Weakness and/or abnormal sensation (such as numbness or tingling) of one or both arms and/or hands.   Muscle spasm.   Difficulty with swallowing or chewing.   Tenderness and swelling at the injury site.  DIAGNOSIS  Most of the time, your caregiver can diagnose this problem with a careful history and  examination. The history will include information about known problems (such as arthritis in the neck) or a previous neck injury. X-rays may be ordered to find out if there is a different problem. X-rays can also help to find problems with the bones of the neck not related to the injury or current symptoms. TREATMENT  Several treatment options are available to help pain, spasm, and other symptoms. They include:  Cold helps relieve pain and reduce inflammation. Cold should be applied for 10 to 15 minutes every 2 to 3 hours after any activity that aggravates your symptoms. Use ice packs or an ice massage. Place a towel or cloth in between your skin and the ice pack.   Medication:   Only take over-the-counter or prescription medicines for pain, discomfort, or fever as directed by your caregiver.   Pain relievers or muscle relaxants may be prescribed. Use only as directed and only as much as you need.   Change in the activity that caused the problem. This might include using a headset with a telephone so that the phone is not propped between your ear and shoulder.   Neck collar. Your caregiver may recommend temporary use of a soft cervical collar.   Work station. Changes may be needed in your work place. A better sitting position and/or better posture during work may be part of your treatment.   Physical Therapy. Your caregiver may recommend physical therapy. This can include instructions in the use of stretching and strengthening exercises. Improvement in posture is important.   Exercises and posture training can help stabilize the neck and strengthen muscles and keep symptoms from returning.  HOME CARE INSTRUCTIONS  Other than formal physical therapy, all treatments above can be done at home. Even when not at work, it is important to be conscious of your posture and of activities that can cause a return of symptoms. Most cervical sprains and/or strains are better in 1-3 weeks. As you improve and  increase activities, doing a warm up and stretching before the activity will help prevent recurrent problems. SEEK MEDICAL CARE IF:   Pain is not effectively controlled with medication.   You feel unable to decrease pain medication over time as planned.   Activity level is not improving as planned and/or expected.  SEEK IMMEDIATE MEDICAL CARE IF:   While using medication, you develop any bleeding, stomach upset, or signs of an allergic reaction.   Symptoms get worse, become intolerable, and are not helped by medications.   New, unexplained symptoms develop.   You experience numbness, tingling, weakness, or paralysis of any part of your body.  MAKE SURE YOU:   Understand these instructions.   Will watch your condition.   Will get help right away if you are not doing well or get worse.  Document Released: 05/17/2007 Document Revised: 04/01/2011 Document Reviewed: 05/17/2007 [facility] Patient Information 2012 [place], [place].Whiplash Whiplash is a soft tissue injury to the neck. It is also called neck sprain or neck strain. It is a collection of symptoms that occur after sudden extension and flexion of the neck, as happens in an automobile crash. Whiplash is not due to a bone fracture, dislocation, or a disc that sticks out (herniated). CAUSES  The disorder commonly occurs as the result of an automobile crash. SYMPTOMS   Neck pain may be present directly after the injury or may be delayed for several days.   In addition to neck pain, other symptoms may include:   Neck stiffness.   Injuries to the muscles and ligaments.   Headache.   Dizziness.   Abnormal sensations such as burning or prickling (paresthesias).   Shoulder or back pain.   Some people experience conditions such as:   Memory loss.   Concentration impairment.   Nervousness.   Irritability.   Sleep disturbances.   Fatigue.   Depression.  TREATMENT  Treatment for individuals with whiplash may  include:  Pain medications.   Nonsteroidal anti-inflammatory drugs.   Antidepressants.   Cervical collar.   Range of motion exercises.   Physical therapy.   Supplemental heat application may relieve muscle tension.  LENGTH OF ILLNESS Generally, the prognosis for individuals with whiplash is excellent. The neck and head pain clears within a few days or weeks. Most patients recover within 3 months after the injury. However, some may continue to have lasting neck pain and headaches. Document Released: 04/29/2005 Document Revised: 04/01/2011 Document Reviewed: 01/07/2009 [facility] Patient Information 2012 [place], [place].

## 2011-09-15 NOTE — ED Notes (Signed)
Pt c/o neck pain and nausea
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
## 2011-10-29 ENCOUNTER — Encounter (INDEPENDENT_AMBULATORY_CARE_PROVIDER_SITE_OTHER): Payer: Self-pay | Admitting: General Surgery

## 2011-10-29 ENCOUNTER — Ambulatory Visit (INDEPENDENT_AMBULATORY_CARE_PROVIDER_SITE_OTHER): Payer: 59 | Admitting: General Surgery

## 2011-10-29 VITALS — BP 154/84 | HR 77 | Temp 97.4°F | Ht 62.0 in | Wt 207.0 lb

## 2011-10-29 DIAGNOSIS — N6320 Unspecified lump in the left breast, unspecified quadrant: Secondary | ICD-10-CM | POA: Insufficient documentation

## 2011-10-29 DIAGNOSIS — N63 Unspecified lump in unspecified breast: Secondary | ICD-10-CM

## 2011-10-29 NOTE — Progress Notes (Signed)
Subjective:     Patient ID: Lisa Horton, female   DOB: [birth year], 66 y.o.   MRN: [id number]  HPI We are asked to see the patient in consultation by Dr. Lurena Joiner to evaluate her for a left breast mass. The patient is a 66 year old white female who recently went to her primary doctor who found a palpable lump in the 12:00 position of the left breast. This was apparently initially found in December and she has a mammogram and ultrasound of that area that showed no evidence of a mass. She has been tender in that area off and on. She denies any discharge from the nipple on either side.  Review of Systems  Constitutional: Negative.   HENT: Negative.   Eyes: Negative.   Respiratory: Negative.   Cardiovascular: Negative.   Gastrointestinal: Negative.   Genitourinary: Negative.   Musculoskeletal: Negative.   Skin: Negative.   Neurological: Negative.   Hematological: Negative.   Psychiatric/Behavioral: Negative.        Objective:   Physical Exam  Constitutional: She is oriented to person, place, and time. She appears well-developed and well-nourished.  HENT:  Head: Normocephalic and atraumatic.  Eyes: Conjunctivae and EOM are normal. Pupils are equal, round, and reactive to light.  Neck: Normal range of motion. Neck supple.  Cardiovascular: Normal rate, regular rhythm and normal heart sounds.   Pulmonary/Chest: Effort normal and breath sounds normal.       The patient may have some very vague fullness in the 12:00 position of the left breast but there is no discrete palpable mass. She also has some fibrocystic feeling tissue in the lower inner aspect of the right breast. No palpable axillary supraclavicular or cervical lymphadenopathy.  Abdominal: Soft. Bowel sounds are normal. She exhibits no mass. There is no tenderness.  Musculoskeletal: Normal range of motion.  Lymphadenopathy:    She has no cervical adenopathy.  Neurological: She is alert and oriented to person, place, and  time.  Skin: Skin is warm and dry.  Psychiatric: She has a normal mood and affect. Her behavior is normal.       Assessment:     The patient is concerned about a palpable mass in the 12:00 position left breast. She had a mammogram and ultrasound 3 months ago that did not show any distortion or mass in this area.    Plan:     At this point I believe it would be reasonable to repeat an ultrasound of the area. If this is negative then we will plan to see her back in about 3 months for a short interval followup to make sure that nothing has changed. She agrees to call us if she has any problems in the meantime

## 2011-11-30 ENCOUNTER — Telehealth (INDEPENDENT_AMBULATORY_CARE_PROVIDER_SITE_OTHER): Payer: Self-pay | Admitting: General Surgery

## 2011-12-01 NOTE — Telephone Encounter (Signed)
Called patient and told her to follow up with PCP

## 2011-12-10 ENCOUNTER — Encounter (INDEPENDENT_AMBULATORY_CARE_PROVIDER_SITE_OTHER): Payer: Self-pay

## 2011-12-11 ENCOUNTER — Encounter (INDEPENDENT_AMBULATORY_CARE_PROVIDER_SITE_OTHER): Payer: Self-pay

## 2012-01-18 ENCOUNTER — Ambulatory Visit (INDEPENDENT_AMBULATORY_CARE_PROVIDER_SITE_OTHER): Payer: 59 | Admitting: General Surgery

## 2012-01-18 ENCOUNTER — Encounter (INDEPENDENT_AMBULATORY_CARE_PROVIDER_SITE_OTHER): Payer: Self-pay | Admitting: General Surgery

## 2012-01-18 VITALS — BP 150/90 | HR 86 | Temp 97.3°F | Resp 14 | Ht 62.0 in | Wt 208.2 lb

## 2012-01-18 DIAGNOSIS — N63 Unspecified lump in unspecified breast: Secondary | ICD-10-CM

## 2012-01-18 DIAGNOSIS — N6320 Unspecified lump in the left breast, unspecified quadrant: Secondary | ICD-10-CM

## 2012-01-18 NOTE — Patient Instructions (Signed)
Continue regular self exams  

## 2012-02-11 ENCOUNTER — Encounter (INDEPENDENT_AMBULATORY_CARE_PROVIDER_SITE_OTHER): Payer: Self-pay | Admitting: General Surgery

## 2012-02-11 NOTE — Progress Notes (Signed)
Subjective:     Patient ID: Lisa Horton, female   DOB: 04-04-1946, 66 y.o.   MRN: [id number]  HPI The patient is a 66 year old female who we saw recently with breast pain. She was evaluated with mammogram and ultrasound that did not show any evidence of malignancy. She continues to have pain in both breasts that comes and goes. She denies any nipple discharge.  Review of Systems  Constitutional: Negative.   HENT: Negative.   Eyes: Negative.   Respiratory: Negative.   Cardiovascular: Negative.   Gastrointestinal: Negative.   Genitourinary: Negative.   Musculoskeletal: Negative.   Skin: Negative.   Neurological: Negative.   Hematological: Negative.   Psychiatric/Behavioral: Negative.        Objective:   Physical Exam  Constitutional: She is oriented to person, place, and time. She appears well-developed and well-nourished.  HENT:  Head: Normocephalic and atraumatic.  Eyes: Conjunctivae and EOM are normal. Pupils are equal, round, and reactive to light.  Neck: Normal range of motion. Neck supple.  Cardiovascular: Normal rate, regular rhythm and normal heart sounds.   Pulmonary/Chest: Effort normal and breath sounds normal.       The patient continues to have some vague fullness in the upper left breast as well as some fibrocystic type tissue in the lower inner right breast. This is stable from her previous exam.  Abdominal: Soft. Bowel sounds are normal. She exhibits no mass. There is no tenderness.  Musculoskeletal: Normal range of motion.  Lymphadenopathy:    She has no cervical adenopathy.  Neurological: She is alert and oriented to person, place, and time.  Skin: Skin is warm and dry.  Psychiatric: She has a normal mood and affect. Her behavior is normal.       Assessment:     Bilateral breast pain    Plan:     At this point her exam is stable and her mammograms and ultrasounds did not show any evidence of malignancy. We will plan to see her back in another 6  months to reexamine her.

## 2012-07-12 ENCOUNTER — Ambulatory Visit (INDEPENDENT_AMBULATORY_CARE_PROVIDER_SITE_OTHER): Payer: 59 | Admitting: General Surgery

## 2012-07-12 ENCOUNTER — Encounter (INDEPENDENT_AMBULATORY_CARE_PROVIDER_SITE_OTHER): Payer: Self-pay | Admitting: General Surgery

## 2012-07-12 VITALS — BP 122/80 | HR 64 | Temp 97.3°F | Resp 16 | Ht 62.0 in | Wt 212.4 lb

## 2012-07-12 DIAGNOSIS — N6320 Unspecified lump in the left breast, unspecified quadrant: Secondary | ICD-10-CM

## 2012-07-12 DIAGNOSIS — N63 Unspecified lump in unspecified breast: Secondary | ICD-10-CM

## 2012-07-20 NOTE — Progress Notes (Signed)
Subjective:     Patient ID: Lisa Horton, female   DOB: September 23, 1945, 66 y.o.   MRN: [id number]  HPI The patient is a 66 year old female who we have seen in the past with left breast tenderness. She felt as though she had a mass in the upper-outer left breast but this was not able to be appreciated by mammogram and ultrasound. Since her last visit she feels as though the fullness in the left breast has gotten a little larger. She denies any acute pain. She denies any discharge from her nipple. She is scheduled for a repeat mammogram on December 27.  Review of Systems  Constitutional: Negative.   HENT: Negative.   Eyes: Negative.   Respiratory: Negative.   Cardiovascular: Negative.   Gastrointestinal: Negative.   Genitourinary: Negative.   Musculoskeletal: Negative.   Skin: Negative.   Neurological: Negative.   Hematological: Negative.   Psychiatric/Behavioral: Negative.        Objective:   Physical Exam  Constitutional: She is oriented to person, place, and time. She appears well-developed and well-nourished.  HENT:  Head: Normocephalic and atraumatic.  Eyes: Conjunctivae normal and EOM are normal. Pupils are equal, round, and reactive to light.  Neck: Normal range of motion. Neck supple.  Cardiovascular: Normal rate, regular rhythm and normal heart sounds.   Pulmonary/Chest: Effort normal and breath sounds normal.       There is no discrete palpable mass in either breast. There is no palpable axillary supraclavicular or cervical lymphadenopathy.  Abdominal: Soft. Bowel sounds are normal. She exhibits no mass. There is no tenderness.  Musculoskeletal: Normal range of motion.  Lymphadenopathy:    She has no cervical adenopathy.  Neurological: She is alert and oriented to person, place, and time.  Skin: Skin is warm and dry.  Psychiatric: She has a normal mood and affect. Her behavior is normal.       Assessment:     The patient continues to have some vague discomfort and  fullness in the left breast. Clinically I cannot appreciate a mass. She is scheduled for followup mammogram and ultrasound on December 27.    Plan:     If these studies are negative then we may consider MRI study to make sure that there is nothing we're missing. We will plan to see her back in about 1 months.

## 2012-08-09 ENCOUNTER — Encounter (INDEPENDENT_AMBULATORY_CARE_PROVIDER_SITE_OTHER): Payer: Self-pay

## 2012-08-19 ENCOUNTER — Other Ambulatory Visit: Payer: Self-pay | Admitting: Family Medicine

## 2012-08-19 DIAGNOSIS — E039 Hypothyroidism, unspecified: Secondary | ICD-10-CM

## 2012-08-22 ENCOUNTER — Ambulatory Visit
Admission: RE | Admit: 2012-08-22 | Discharge: 2012-08-22 | Disposition: A | Discharge status: Home / Self Care | Payer: 59 | Source: Ambulatory Visit | Attending: Family Medicine | Admitting: Family Medicine

## 2012-08-22 DIAGNOSIS — E039 Hypothyroidism, unspecified: Secondary | ICD-10-CM

## 2012-08-25 ENCOUNTER — Ambulatory Visit (INDEPENDENT_AMBULATORY_CARE_PROVIDER_SITE_OTHER): Payer: 59 | Admitting: General Surgery

## 2012-08-25 ENCOUNTER — Encounter (INDEPENDENT_AMBULATORY_CARE_PROVIDER_SITE_OTHER): Payer: Self-pay | Admitting: General Surgery

## 2012-08-25 VITALS — BP 132/84 | HR 80 | Temp 98.2°F | Resp 16 | Ht 62.0 in | Wt 217.8 lb

## 2012-08-25 DIAGNOSIS — N644 Mastodynia: Secondary | ICD-10-CM | POA: Insufficient documentation

## 2012-08-25 NOTE — Progress Notes (Signed)
Subjective:     Patient ID: Lisa Horton, female   DOB: [birth year], 67 y.o.   MRN: [id number]  HPI The patient is a 67 year old black female who we have been following for some fullness and tenderness in the lateral left breast. Since her last visit she no longer fills the fullness but still has some soreness in the lateral left breast. She states she actually has soreness at different places along both breasts. She did have a recent mammogram and ultrasound which showed a 5 mm cyst in the lateral aspect of the left breast but no other worrisome masses or distortion  Review of Systems  Constitutional: Negative.   HENT: Negative.   Eyes: Negative.   Respiratory: Negative.   Cardiovascular: Negative.   Gastrointestinal: Negative.   Genitourinary: Negative.   Musculoskeletal: Negative.   Skin: Negative.   Neurological: Negative.   Hematological: Negative.   Psychiatric/Behavioral: Negative.        Objective:   Physical Exam  Constitutional: She is oriented to person, place, and time. She appears well-developed and well-nourished.  HENT:  Head: Normocephalic and atraumatic.  Eyes: Conjunctivae normal and EOM are normal. Pupils are equal, round, and reactive to light.  Neck: Normal range of motion. Neck supple.  Cardiovascular: Normal rate, regular rhythm and normal heart sounds.   Pulmonary/Chest: Effort normal and breath sounds normal.       She has dense breast tissue bilaterally. There is no discrete palpable mass in either breast. There is no palpable axillary or supraclavicular cervical lymphadenopathy.  Abdominal: Soft. Bowel sounds are normal. She exhibits no mass. There is no tenderness.  Musculoskeletal: Normal range of motion.  Lymphadenopathy:    She has no cervical adenopathy.  Neurological: She is alert and oriented to person, place, and time.  Skin: Skin is warm and dry.  Psychiatric: She has a normal mood and affect. Her behavior is normal.       Assessment:     The patient has had persistent tenderness in the lateral left breast. Her mammogram and ultrasound have been negative. The other study that we could obtain would be an MRI of the breast. She is not interested in having this study done.    Plan:     We will therefore see her back in 3 months for a repeat physical exam. She will continue do regular self exams

## 2012-08-25 NOTE — Patient Instructions (Signed)
Continue regular self exams  

## 2012-11-21 ENCOUNTER — Ambulatory Visit (INDEPENDENT_AMBULATORY_CARE_PROVIDER_SITE_OTHER): Payer: 59 | Admitting: General Surgery

## 2012-11-21 ENCOUNTER — Encounter (INDEPENDENT_AMBULATORY_CARE_PROVIDER_SITE_OTHER): Payer: Self-pay | Admitting: General Surgery

## 2012-11-21 VITALS — BP 122/62 | HR 88 | Temp 97.3°F | Resp 20 | Ht 62.0 in | Wt 219.8 lb

## 2012-11-21 DIAGNOSIS — N644 Mastodynia: Secondary | ICD-10-CM

## 2012-11-21 NOTE — Patient Instructions (Signed)
Continue regular self exams  

## 2012-11-21 NOTE — Progress Notes (Signed)
Subjective:     Patient ID: Lisa Horton, female   DOB: 10/18/1945, 67 y.o.   MRN: [id number]  HPI The patient is a 67 year old black female who we have been following for about a year for left breast pain. Since her last visit her pain has not changed at all. The pain comes and goes and is mostly in the upper outer left breast. She is not having discharge from her nipple. Her mammograms and ultrasounds have been negative. She does do regular self exams and has not felt any new lumps or bumps. She does have a history of a daughter that was diagnosed with breast cancer at the age of 46. She is unaware of her daughter underwent genetic testing or not.  Review of Systems  Constitutional: Negative.   HENT: Negative.   Eyes: Negative.   Respiratory: Negative.   Cardiovascular: Negative.   Gastrointestinal: Negative.   Endocrine: Negative.   Genitourinary: Negative.   Musculoskeletal: Negative.   Skin: Negative.   Allergic/Immunologic: Negative.   Neurological: Negative.   Hematological: Negative.   Psychiatric/Behavioral: Negative.        Objective:   Physical Exam  Constitutional: She is oriented to person, place, and time. She appears well-developed and well-nourished.  HENT:  Head: Normocephalic and atraumatic.  Eyes: Conjunctivae and EOM are normal. Pupils are equal, round, and reactive to light.  Neck: Normal range of motion. Neck supple.  Cardiovascular: Normal rate, regular rhythm and normal heart sounds.   Pulmonary/Chest: Effort normal and breath sounds normal.  There is no palpable mass in either breast. There is no palpable axillary or supraclavicular cervical lymphadenopathy. She does have symmetric dense breast tissue bilaterally  Abdominal: Soft. Bowel sounds are normal.  Musculoskeletal: Normal range of motion.  Lymphadenopathy:    She has no cervical adenopathy.  Neurological: She is alert and oriented to person, place, and time.  Skin: Skin is warm and dry.   Psychiatric: She has a normal mood and affect. Her behavior is normal.       Assessment:     The patient has a stable history for left breast pain. At this point we have not seen any clinical evidence for any worrisome lesions.     Plan:     At this point she will continue to do regular self checks. We will plan to see her back on a yearly basis.

## 2013-03-31 ENCOUNTER — Other Ambulatory Visit: Payer: Self-pay | Admitting: Family Medicine

## 2013-04-04 ENCOUNTER — Ambulatory Visit
Admission: RE | Admit: 2013-04-04 | Discharge: 2013-04-04 | Disposition: A | Discharge status: Home / Self Care | Payer: 59 | Source: Ambulatory Visit | Attending: Family Medicine | Admitting: Family Medicine

## 2013-05-02 ENCOUNTER — Encounter (INDEPENDENT_AMBULATORY_CARE_PROVIDER_SITE_OTHER): Payer: Self-pay

## 2013-05-22 ENCOUNTER — Encounter: Payer: Self-pay | Admitting: Dietician

## 2013-05-22 ENCOUNTER — Encounter: Payer: 59 | Attending: Family Medicine | Admitting: Dietician

## 2013-05-22 VITALS — Ht 62.0 in | Wt 218.3 lb

## 2013-05-22 DIAGNOSIS — Z713 Dietary counseling and surveillance: Secondary | ICD-10-CM | POA: Insufficient documentation

## 2013-05-22 DIAGNOSIS — E669 Obesity, unspecified: Secondary | ICD-10-CM | POA: Insufficient documentation

## 2013-05-22 NOTE — Progress Notes (Signed)
  Medical Nutrition Therapy:  Appt start time: 1030 end time:  1130.  Assessment:  Primary concerns today: Lisa Horton is here today since her doctor stated that her blood sugar is running high (prediabetes).   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 States that she lives with her daughter and does most of the food shopping and cooking. Goes out to eat about 5 days per week.   Skips breakfast since it either makes her too hungry or upsets her stomach. Stays up late (until about 4 AM) and not sleeping very much. Gained weight in the last 5 years.   MEDICATIONS: see list    DIETARY INTAKE:  Avoided foods include asparagus   24-hr recall:  B ( AM): none  Snk ( AM): none  L (1-2PM): fruit with meat or sandwich with water, soda, or sweet tea Snk ( PM): popcorn, cookies D ( PM): meat, salad Snk ( PM): popcorn, cookies, nuts Beverages: water, sweet tea, or soda  Usual physical activity: none  Estimated energy needs: 1600 calories 180 g carbohydrates 120 g protein 44 g fat  Progress Towards Goal(s):  In progress.   Nutritional Diagnosis:  NB-1.1 Food and nutrition-related knowledge deficit As related to excess consumption of sugar-sweetened beverages, frequent restaurant meals, and frequent nighttime snacking.  As evidenced by BMI of 39.9 and pre-diabetes.    Intervention:  Nutrition counseling provided. Discussed the pathophysiology of insulin resistance and factors that affect blood sugar such as weight gain/loss, carbohydrate intake, exercise, and stress management. Recommended spreading carbs out throughout the day, adding exercise, limiting sweet drinks, and portioning out snack foods.   Plan: Try to eat 3 meals per day and 2-3 snacks if you are hungry. Have snacks with protein and carbohydrates. Fill half of your plate with vegetables. Pick one starch or carbohydrate to have at a meal. Consider saving half of a restaurant meal for a second  meal (have them box up half of the entree). Consider switching to water, seltzer, or water with lemon or unsweet tea for beverages. Start walking again, with a goal of 30 minutes per day 5 x week. Talk with your doctor to work on better sleep habits.  Handouts given during visit include:  MyPlate Handout  15 g CHO Snacks  Living Well With Diabetes  Yellow Card  Monitoring/Evaluation:  Dietary intake, exercise, mindful eating, and body weight prn.

## 2013-05-22 NOTE — Patient Instructions (Signed)
Try to eat 3 meals per day and 2-3 snacks if you are hungry. Have snacks with protein and carbohydrates. Fill half of your plate with vegetables. Pick one starch or carbohydrate to have at a meal. Consider saving half of a restaurant meal for a second meal (have them box up half of the entree). Consider switching to water, seltzer, or water with lemon or unsweet tea for beverages. Start walking again, with a goal of 30 minutes per day 5 x week. Talk with your doctor to work on better sleep habits.

## 2013-07-31 DIAGNOSIS — N6049 Mammary duct ectasia of unspecified breast: Secondary | ICD-10-CM | POA: Diagnosis not present

## 2013-08-31 ENCOUNTER — Encounter: Payer: Self-pay | Admitting: Nurse Practitioner

## 2013-08-31 ENCOUNTER — Ambulatory Visit (INDEPENDENT_AMBULATORY_CARE_PROVIDER_SITE_OTHER): Payer: Medicare Other | Admitting: Nurse Practitioner

## 2013-08-31 VITALS — BP 141/82 | HR 83 | Resp 20 | Ht 63.0 in | Wt 223.0 lb

## 2013-08-31 DIAGNOSIS — Z0000 Encounter for general adult medical examination without abnormal findings: Secondary | ICD-10-CM

## 2013-08-31 DIAGNOSIS — Z01419 Encounter for gynecological examination (general) (routine) without abnormal findings: Secondary | ICD-10-CM

## 2013-08-31 LAB — POCT URINALYSIS DIPSTICK
Bilirubin, UA: NEGATIVE
Blood, UA: NEGATIVE
Glucose, UA: NEGATIVE
KETONES UA: NEGATIVE
Leukocytes, UA: NEGATIVE
Nitrite, UA: NEGATIVE
PH UA: 7
PROTEIN UA: NEGATIVE
UROBILINOGEN UA: NEGATIVE

## 2013-08-31 NOTE — Progress Notes (Signed)
68 y.o. Lisa Horton Single African American Fe here for annual exam.    No LMP recorded. Patient has had a hysterectomy.          Sexually active: no  The current method of family planning is none.    Exercising: no  The patient does not participate in regular exercise at present. Smoker:  Former smoker  Health Maintenance: Pap: 01/2009 MMG: 07/2013 Colonoscopy: 2010 BMD:   2011 TDaP: 11/2011. Pt reports 12/2012 Labs: PCP   reports that she has quit smoking. She has never used smokeless tobacco. She reports that she drinks alcohol. She reports that she does not use illicit drugs.  Past Medical History  Diagnosis Date  . Acid reflux   . Breast lump   . Chicken pox   . Measles   . Sinusitis   . Colon polyp   . Hypertension   . Arthritis   . Nipple discharge   . Night sweats   . Gum disease   . Thyroid disease     hypothyroidism  . Incontinence   . Bruises easily     Past Surgical History  Procedure Laterality Date  . Tubal ligation    . Abdominal hysterectomy    . Skin tag removal      brow and lid  . Thigh / knee soft tissue biopsy    . Breast surgery      mass removal  . Cholecystectomy  03/17/11  . Vaginal hysterectomy      Current Outpatient Prescriptions  Medication Sig Dispense Refill  . cetirizine (ZYRTEC) 10 MG tablet Take 10 mg by mouth daily.        . Cholecalciferol (VITAMIN D PO) Take 50,000 Units by mouth once a week.       . Coenzyme Q10 (CO Q 10 PO) Take 1 tablet by mouth daily.       Marland Kitchen esomeprazole (NEXIUM) 40 MG capsule Take 40 mg by mouth daily.      . Iron Combinations (IRON COMPLEX PO) Take by mouth.      . levothyroxine (SYNTHROID, LEVOTHROID) 100 MCG tablet Take 100 mcg by mouth daily before breakfast.      . olmesartan (BENICAR) 40 MG tablet Take 40 mg by mouth daily.      . Probiotic Product (PROBIOTIC FORMULA) CAPS Take by mouth.      Marland Kitchen CALCIUM PO Take 1 tablet by mouth daily.        . Multiple Vitamins-Minerals (MULTIVITAMINS THER.  W/MINERALS) TABS Take 1 tablet by mouth daily.       No current facility-administered medications for this visit.    Family History  Problem Relation Age of Onset  . Hypertension Mother   . Cancer Mother     stomach  . Hypertension Father   . Cancer Father     prostate  . Hypertension Sister   . Hypertension Brother   . Alzheimer's disease Brother   . Hypertension Sister     ROS:  Pertinent items are noted in HPI.  Otherwise, a comprehensive ROS was negative.  Exam:   BP 141/82  Pulse 83  Resp 20  Ht 5\' 3"  (1.6 m)  Wt 223 lb (101.152 kg)  BMI 39.51 kg/m2 Height: 5\' 3"  (160 cm)  Ht Readings from Last 3 Encounters:  08/31/13 5\' 3"  (1.6 m)  05/22/13 5\' 2"  (1.575 m)  11/21/12 5\' 2"  (1.575 m)    General appearance: alert, cooperative and appears stated age Head: Normocephalic, without obvious  abnormality, atraumatic Neck: no adenopathy, supple, symmetrical, trachea midline and thyroid normal to inspection and palpation Lungs: clear to auscultation bilaterally Breasts: normal appearance, no masses or tenderness Heart: regular rate and rhythm Abdomen: soft, non-tender; no masses,  no organomegaly Extremities: extremities normal, atraumatic, no cyanosis or edema Skin: Skin color, texture, turgor normal. No rashes or lesions Lymph nodes: Cervical, supraclavicular, and axillary nodes normal. No abnormal inguinal nodes palpated Neurologic: Grossly normal   Pelvic: External genitalia:  no lesions              Urethra:  normal appearing urethra with no masses, tenderness or lesions              Bartholin's and Skene's: normal                 Vagina: normal appearing vagina with normal color and discharge, no lesions              Cervix: absent              Pap taken: yes per request Bimanual Exam:  Uterus:  uterus absent              Adnexa: no mass, fullness, tenderness               Rectovaginal: Confirms               Anus:  normal sphincter tone, no lesions  A:  Well  Woman with normal exam  S/P TAH 1986 secondary to fibroids off ERT 12/2009  P:   Pap smear as per guidelines   Mammogram due 12/115  Counseled on breast self exam, mammography screening, adequate intake of calcium and vitamin D, diet and exercise, Kegel's exercises return annually or prn  An After Visit Summary was printed and given to the patient.

## 2013-08-31 NOTE — Patient Instructions (Signed)

## 2013-08-31 NOTE — Progress Notes (Signed)
Reviewed personally.  M. Suzanne Alexandra Posadas, MD.  

## 2013-09-04 LAB — IPS PAP SMEAR ONLY

## 2013-10-03 DIAGNOSIS — I10 Essential (primary) hypertension: Secondary | ICD-10-CM | POA: Diagnosis not present

## 2013-10-03 DIAGNOSIS — E559 Vitamin D deficiency, unspecified: Secondary | ICD-10-CM | POA: Diagnosis not present

## 2013-10-03 DIAGNOSIS — R0982 Postnasal drip: Secondary | ICD-10-CM | POA: Diagnosis not present

## 2013-10-03 DIAGNOSIS — R7301 Impaired fasting glucose: Secondary | ICD-10-CM | POA: Diagnosis not present

## 2013-10-03 DIAGNOSIS — E785 Hyperlipidemia, unspecified: Secondary | ICD-10-CM | POA: Diagnosis not present

## 2013-10-03 DIAGNOSIS — E039 Hypothyroidism, unspecified: Secondary | ICD-10-CM | POA: Diagnosis not present

## 2013-10-03 DIAGNOSIS — R7989 Other specified abnormal findings of blood chemistry: Secondary | ICD-10-CM | POA: Diagnosis not present

## 2013-10-03 DIAGNOSIS — E041 Nontoxic single thyroid nodule: Secondary | ICD-10-CM | POA: Diagnosis not present

## 2013-10-09 ENCOUNTER — Other Ambulatory Visit: Payer: Self-pay | Admitting: Family Medicine

## 2013-10-09 DIAGNOSIS — J310 Chronic rhinitis: Secondary | ICD-10-CM | POA: Diagnosis not present

## 2013-10-09 DIAGNOSIS — E049 Nontoxic goiter, unspecified: Secondary | ICD-10-CM

## 2013-10-09 DIAGNOSIS — J342 Deviated nasal septum: Secondary | ICD-10-CM | POA: Diagnosis not present

## 2013-10-09 DIAGNOSIS — R439 Unspecified disturbances of smell and taste: Secondary | ICD-10-CM | POA: Diagnosis not present

## 2013-10-09 DIAGNOSIS — J392 Other diseases of pharynx: Secondary | ICD-10-CM | POA: Diagnosis not present

## 2013-10-09 DIAGNOSIS — K219 Gastro-esophageal reflux disease without esophagitis: Secondary | ICD-10-CM | POA: Diagnosis not present

## 2013-10-16 ENCOUNTER — Ambulatory Visit
Admission: RE | Admit: 2013-10-16 | Discharge: 2013-10-16 | Disposition: A | Discharge status: Home / Self Care | Payer: Medicare Other | Source: Ambulatory Visit | Attending: Family Medicine | Admitting: Family Medicine

## 2013-10-16 DIAGNOSIS — E049 Nontoxic goiter, unspecified: Secondary | ICD-10-CM | POA: Diagnosis not present

## 2013-11-01 DIAGNOSIS — E039 Hypothyroidism, unspecified: Secondary | ICD-10-CM | POA: Diagnosis not present

## 2013-12-18 ENCOUNTER — Encounter (INDEPENDENT_AMBULATORY_CARE_PROVIDER_SITE_OTHER): Payer: Self-pay | Admitting: General Surgery

## 2013-12-18 ENCOUNTER — Ambulatory Visit (INDEPENDENT_AMBULATORY_CARE_PROVIDER_SITE_OTHER): Payer: Medicare Other | Admitting: General Surgery

## 2013-12-18 VITALS — BP 152/82 | HR 80 | Temp 97.6°F | Resp 14 | Ht 62.0 in | Wt 221.8 lb

## 2013-12-18 DIAGNOSIS — N644 Mastodynia: Secondary | ICD-10-CM

## 2013-12-18 NOTE — Progress Notes (Signed)
Subjective:     Patient ID: Lisa Horton, female   DOB: April 11, 1946, 68 y.o.   MRN: [id number]  HPI The patient is a 68 year old white female who we have been following for 2 years for breast pain. She states that over the last year she feels like the pain may have gotten slightly worse. She denies any discharge from her nipple. She did have a mammogram and ultrasound evaluation December that was negative.  Review of Systems  Constitutional: Negative.   HENT: Negative.   Eyes: Negative.   Respiratory: Negative.   Cardiovascular: Negative.   Gastrointestinal: Negative.   Endocrine: Negative.   Genitourinary: Negative.   Musculoskeletal: Negative.   Skin: Negative.   Allergic/Immunologic: Negative.   Neurological: Negative.   Hematological: Negative.   Psychiatric/Behavioral: Negative.        Objective:   Physical Exam  Constitutional: She is oriented to person, place, and time. She appears well-developed and well-nourished.  HENT:  Head: Normocephalic and atraumatic.  Eyes: Conjunctivae and EOM are normal. Pupils are equal, round, and reactive to light.  Neck: Normal range of motion. Neck supple.  Cardiovascular: Normal rate, regular rhythm and normal heart sounds.   Pulmonary/Chest: Effort normal and breath sounds normal.  There is no palpable mass in either breast. There is no palpable axillary, supraclavicular, or cervical lymphadenopathy.  Abdominal: Soft. Bowel sounds are normal.  Musculoskeletal: Normal range of motion.  Lymphadenopathy:    She has no cervical adenopathy.  Neurological: She is alert and oriented to person, place, and time.  Skin: Skin is warm and dry.  Psychiatric: She has a normal mood and affect. Her behavior is normal.       Assessment:     The patient appears to have chronic breast pain. Her workup has been negative.     Plan:     At this point she will continue to do regular self exams. I will plan to see her back in one year.

## 2013-12-18 NOTE — Patient Instructions (Signed)
Continue regular self exams  

## 2014-01-01 DIAGNOSIS — R439 Unspecified disturbances of smell and taste: Secondary | ICD-10-CM | POA: Diagnosis not present

## 2014-01-01 DIAGNOSIS — J342 Deviated nasal septum: Secondary | ICD-10-CM | POA: Diagnosis not present

## 2014-01-01 DIAGNOSIS — K219 Gastro-esophageal reflux disease without esophagitis: Secondary | ICD-10-CM | POA: Diagnosis not present

## 2014-01-01 DIAGNOSIS — J310 Chronic rhinitis: Secondary | ICD-10-CM | POA: Diagnosis not present

## 2014-02-13 DIAGNOSIS — R7309 Other abnormal glucose: Secondary | ICD-10-CM | POA: Diagnosis not present

## 2014-02-13 DIAGNOSIS — N183 Chronic kidney disease, stage 3 unspecified: Secondary | ICD-10-CM | POA: Diagnosis not present

## 2014-02-13 DIAGNOSIS — E785 Hyperlipidemia, unspecified: Secondary | ICD-10-CM | POA: Diagnosis not present

## 2014-02-13 DIAGNOSIS — I10 Essential (primary) hypertension: Secondary | ICD-10-CM | POA: Diagnosis not present

## 2014-02-13 DIAGNOSIS — E039 Hypothyroidism, unspecified: Secondary | ICD-10-CM | POA: Diagnosis not present

## 2014-06-03 DIAGNOSIS — R7303 Prediabetes: Secondary | ICD-10-CM

## 2014-06-03 HISTORY — DX: Prediabetes: R73.03

## 2014-06-04 ENCOUNTER — Encounter (INDEPENDENT_AMBULATORY_CARE_PROVIDER_SITE_OTHER): Payer: Self-pay | Admitting: General Surgery

## 2014-06-18 DIAGNOSIS — Z1389 Encounter for screening for other disorder: Secondary | ICD-10-CM | POA: Diagnosis not present

## 2014-06-18 DIAGNOSIS — E780 Pure hypercholesterolemia: Secondary | ICD-10-CM | POA: Diagnosis not present

## 2014-06-18 DIAGNOSIS — E559 Vitamin D deficiency, unspecified: Secondary | ICD-10-CM | POA: Diagnosis not present

## 2014-06-18 DIAGNOSIS — N189 Chronic kidney disease, unspecified: Secondary | ICD-10-CM | POA: Diagnosis not present

## 2014-06-18 DIAGNOSIS — I129 Hypertensive chronic kidney disease with stage 1 through stage 4 chronic kidney disease, or unspecified chronic kidney disease: Secondary | ICD-10-CM | POA: Diagnosis not present

## 2014-06-18 DIAGNOSIS — E1122 Type 2 diabetes mellitus with diabetic chronic kidney disease: Secondary | ICD-10-CM | POA: Diagnosis not present

## 2014-06-18 DIAGNOSIS — Z23 Encounter for immunization: Secondary | ICD-10-CM | POA: Diagnosis not present

## 2014-06-18 DIAGNOSIS — E039 Hypothyroidism, unspecified: Secondary | ICD-10-CM | POA: Diagnosis not present

## 2014-07-06 DIAGNOSIS — E039 Hypothyroidism, unspecified: Secondary | ICD-10-CM | POA: Diagnosis not present

## 2014-09-03 ENCOUNTER — Encounter: Payer: Self-pay | Admitting: Nurse Practitioner

## 2014-09-03 ENCOUNTER — Ambulatory Visit (INDEPENDENT_AMBULATORY_CARE_PROVIDER_SITE_OTHER): Payer: Medicare Other | Admitting: Nurse Practitioner

## 2014-09-03 VITALS — BP 130/66 | HR 84 | Resp 20 | Ht 62.75 in | Wt 213.0 lb

## 2014-09-03 DIAGNOSIS — Z0000 Encounter for general adult medical examination without abnormal findings: Secondary | ICD-10-CM

## 2014-09-03 DIAGNOSIS — M8580 Other specified disorders of bone density and structure, unspecified site: Secondary | ICD-10-CM

## 2014-09-03 DIAGNOSIS — Z01419 Encounter for gynecological examination (general) (routine) without abnormal findings: Secondary | ICD-10-CM

## 2014-09-03 LAB — POCT URINALYSIS DIPSTICK
BILIRUBIN UA: NEGATIVE
Blood, UA: NEGATIVE
Glucose, UA: NEGATIVE
KETONES UA: NEGATIVE
Leukocytes, UA: NEGATIVE
NITRITE UA: NEGATIVE
Protein, UA: NEGATIVE
Urobilinogen, UA: NEGATIVE
pH, UA: 5

## 2014-09-03 NOTE — Patient Instructions (Signed)

## 2014-09-03 NOTE — Progress Notes (Signed)
69 y.o. [id number] Single  African American Fe here for annual exam.  New diagnosis with pre diabetes.  Last HGB AIC was 7.0 in November.  Patient's last menstrual period was 08/03/1984.          Sexually active: No.  The current method of family planning is status post hysterectomy.    Exercising: No.  The patient does not participate in regular exercise at present. Smoker:  no  Health Maintenance: Pap: 08/31/13 Neg MMG: 07/31/13 BIRADS0:Incomplete. Patient has apt 09/17/14 @[facility]   Colonoscopy: 2011 Dr. Collene Mares- repeat 5 years  BMD:   07/2010 TDaP: 2013 Prevnar 13 06/2014 Shingles: not taking Labs: PCP  HA:LPFXT   reports that she has quit smoking. She has never used smokeless tobacco. She reports that she drinks alcohol. She reports that she does not use illicit drugs.  Past Medical History  Diagnosis Date  . Acid reflux   . Breast lump   . Chicken pox   . Measles   . Sinusitis   . Colon polyp   . Hypertension   . Arthritis   . Nipple discharge   . Night sweats   . Gum disease   . Thyroid disease     hypothyroidism  . Incontinence   . Bruises easily   . Prediabetes 06/2014    Past Surgical History  Procedure Laterality Date  . Tubal ligation    . Skin tag removal      brow and lid  . Thigh / knee soft tissue biopsy    . Breast surgery      mass removal  . Cholecystectomy  03/17/11  . Abdominal hysterectomy  1986    secondary to fibroids    Current Outpatient Prescriptions  Medication Sig Dispense Refill  . CALCIUM PO Take 1 tablet by mouth daily.      . cetirizine (ZYRTEC) 10 MG tablet Take 10 mg by mouth daily.      . Cholecalciferol (VITAMIN D PO) Take 50,000 Units by mouth once a week.     . Coenzyme Q10 (CO Q 10 PO) Take 1 tablet by mouth daily.     Marland Kitchen esomeprazole (NEXIUM) 40 MG capsule Take 40 mg by mouth daily.    . irbesartan (AVAPRO) 300 MG tablet     . Iron Combinations (IRON COMPLEX PO) Take by mouth.    . levothyroxine (SYNTHROID, LEVOTHROID) 125 MCG  tablet Take 1 tablet by mouth daily.    . Multiple Vitamins-Minerals (MULTIVITAMINS THER. W/MINERALS) TABS Take 1 tablet by mouth daily.    . Probiotic Product (PROBIOTIC FORMULA) CAPS Take by mouth.     No current facility-administered medications for this visit.    Family History  Problem Relation Age of Onset  . Hypertension Mother   . Cancer Mother     stomach  . Hypertension Father   . Cancer Father     prostate  . Hypertension Sister   . Hypertension Brother   . Alzheimer's disease Brother   . Hypertension Sister     ROS:  Pertinent items are noted in HPI.  Otherwise, a comprehensive ROS was negative.  Exam:   BP 130/66 mmHg  Pulse 84  Resp 20  Ht 5' 2.75" (1.594 m)  Wt 213 lb (96.616 kg)  BMI 38.03 kg/m2  LMP 08/03/1984 Height: 5' 2.75" (159.4 cm) Ht Readings from Last 3 Encounters:  09/03/14 5' 2.75" (1.594 m)  12/18/13 5\' 2"  (1.575 m)  08/31/13 5\' 3"  (1.6 m)    General  appearance: alert, cooperative and appears stated age Head: Normocephalic, without obvious abnormality, atraumatic Neck: no adenopathy, supple, symmetrical, trachea midline and thyroid normal to inspection and palpation Lungs: clear to auscultation bilaterally Breasts: normal appearance, no masses or tenderness Heart: regular rate and rhythm Abdomen: soft, non-tender; no masses,  no organomegaly Extremities: extremities normal, atraumatic, no cyanosis or edema Skin: Skin color, texture, turgor normal. No rashes or lesions Lymph nodes: Cervical, supraclavicular, and axillary nodes normal. No abnormal inguinal nodes palpated Neurologic: Grossly normal   Pelvic: External genitalia:  no lesions              Urethra:  normal appearing urethra with no masses, tenderness or lesions              Bartholin's and Skene's: normal                 Vagina: normal appearing vagina with normal color and discharge, no lesions              Cervix: absent              Pap taken: No. Bimanual Exam:   Uterus:  uterus absent              Adnexa: no mass, fullness, tenderness               Rectovaginal: Confirms               Anus:  normal sphincter tone, no lesions  Chaperone present: No  A:  Well Woman with normal exam  S/P TAH 1986 secondary to fibroids off ERT 12/2009  Hypothyroid with recent changes  P:   Reviewed health and wellness pertinent to exam  Pap smear taken today  Mammogram is due and is scheduled  Counseled on breast self exam, mammography screening, adequate intake of calcium and vitamin D, diet and exercise return annually or prn  An After Visit Summary was printed and given to the patient.

## 2014-09-09 NOTE — Progress Notes (Signed)
Encounter reviewed by Dr. Ashelynn Marks Silva.  

## 2014-10-02 ENCOUNTER — Telehealth: Payer: Self-pay | Admitting: Nurse Practitioner

## 2014-10-02 NOTE — Telephone Encounter (Signed)
Please let patient know that her BMD done on 09/24/14 shows a T score at the spine of -1.0; left hip -1.1; right hip -2.1.  The lowest measurement is the right hip which is in the lower Osteopenic range.  The FRAX score for major fracture in 10 years is 4.5% (goal is <20%); for hip fracture in 10 years is 0.7% (goal is <3%).  Comparison to 2011 BMD there is no statistical change.  She must continue with Vit d and exercise especially walking.  Even though she is lactose intolerant she must get green leafy vegetables in her diet.  Recheck in 3 years.  Dr. Estill Bamberg & Dr.Toth also got a copy of BMD.

## 2014-10-09 NOTE — Telephone Encounter (Signed)
Pt notified of BMD results and is agreeable to plan.

## 2014-12-17 ENCOUNTER — Other Ambulatory Visit (INDEPENDENT_AMBULATORY_CARE_PROVIDER_SITE_OTHER): Payer: Self-pay

## 2014-12-17 DIAGNOSIS — R590 Localized enlarged lymph nodes: Secondary | ICD-10-CM

## 2014-12-17 DIAGNOSIS — M79621 Pain in right upper arm: Secondary | ICD-10-CM

## 2014-12-18 ENCOUNTER — Other Ambulatory Visit: Payer: Self-pay | Admitting: *Deleted

## 2014-12-18 DIAGNOSIS — R2231 Localized swelling, mass and lump, right upper limb: Secondary | ICD-10-CM

## 2014-12-18 DIAGNOSIS — M79621 Pain in right upper arm: Secondary | ICD-10-CM

## 2014-12-21 ENCOUNTER — Ambulatory Visit
Admission: RE | Admit: 2014-12-21 | Discharge: 2014-12-21 | Disposition: A | Discharge status: Home / Self Care | Payer: Medicare Other | Source: Ambulatory Visit | Attending: General Surgery | Admitting: General Surgery

## 2015-04-26 ENCOUNTER — Encounter: Payer: Self-pay | Admitting: Nurse Practitioner

## 2015-04-26 ENCOUNTER — Ambulatory Visit (INDEPENDENT_AMBULATORY_CARE_PROVIDER_SITE_OTHER): Payer: Medicare Other | Admitting: Nurse Practitioner

## 2015-04-26 VITALS — BP 130/78 | HR 76 | Ht 62.75 in | Wt 225.0 lb

## 2015-04-26 DIAGNOSIS — N760 Acute vaginitis: Secondary | ICD-10-CM

## 2015-04-26 LAB — POCT URINALYSIS DIPSTICK
BILIRUBIN UA: NEGATIVE
GLUCOSE UA: NEGATIVE
Ketones, UA: NEGATIVE
LEUKOCYTES UA: NEGATIVE
NITRITE UA: NEGATIVE
Protein, UA: NEGATIVE
RBC UA: NEGATIVE
UROBILINOGEN UA: NEGATIVE
pH, UA: 6

## 2015-04-26 MED ORDER — TERCONAZOLE 0.4 % VA CREA: 1.0000 | TOPICAL_CREAM | Freq: Every day | VAGINAL | Status: DC

## 2015-04-26 NOTE — Progress Notes (Addendum)
69 y.o.SAA female G2P2 here with complaint of vaginal symptoms of itching, burning, and increase discharge. Describes discharge as minimal that is white. Onset of symptoms 14 days ago. Denies new personal products or vaginal dryness.  Recent antibiotics of Amoxil to treat gum disease. No STD concerns. Urinary symptoms with frequency but no dysuria.   O:Healthy female WDWN Affect: normal, orientation x 3  Exam: no distress Abdomen: soft and non tender Lymph node: no enlargement or tenderness Pelvic exam: External genital: normal female BUS: negative Vagina: white  discharge noted. Affirm taken Adnexa:normal, non tender, no masses or fullness noted  Affirm is done   A: Vaginitis with history of BV  Recent antibiotics for gum disease - suspect yeast  S/P TAH for fibroids  Recent eval for elevated liver enzymes - now stable   P: Discussed findings of vaginitis and etiology. Discussed Aveeno or baking soda sitz bath for comfort. Avoid moist clothes or pads for extended period of time. If working out in gym clothes or swim suits for long periods of time change underwear or bottoms of swimsuit if possible. Olive Oil/Coconut Oil use for skin protection prior to activity can be used to external skin. Rx: will try Terazol 7 treatment for now until Affirm results are back.  RV prn

## 2015-04-26 NOTE — Patient Instructions (Signed)

## 2015-04-27 LAB — WET PREP BY MOLECULAR PROBE
Candida species: NEGATIVE
GARDNERELLA VAGINALIS: NEGATIVE
TRICHOMONAS VAG: NEGATIVE

## 2015-04-28 NOTE — Progress Notes (Signed)
Encounter reviewed by Dr. Brook Amundson C. Silva.  

## 2015-09-09 ENCOUNTER — Encounter: Payer: Self-pay | Admitting: Nurse Practitioner

## 2015-09-09 ENCOUNTER — Ambulatory Visit (INDEPENDENT_AMBULATORY_CARE_PROVIDER_SITE_OTHER): Payer: Medicare Other | Admitting: Nurse Practitioner

## 2015-09-09 VITALS — BP 130/74 | HR 92 | Ht 62.5 in | Wt 224.0 lb

## 2015-09-09 DIAGNOSIS — Z0000 Encounter for general adult medical examination without abnormal findings: Secondary | ICD-10-CM

## 2015-09-09 DIAGNOSIS — M8580 Other specified disorders of bone density and structure, unspecified site: Secondary | ICD-10-CM | POA: Diagnosis not present

## 2015-09-09 DIAGNOSIS — Z01419 Encounter for gynecological examination (general) (routine) without abnormal findings: Secondary | ICD-10-CM | POA: Diagnosis not present

## 2015-09-09 NOTE — Progress Notes (Signed)
Patient ID: Lisa Horton, female   DOB: 05/23/1946, 70 y.o.   MRN: [id number]  70 y.o. [id number] Single  African American Fe here for annual exam.  Recent bronchitis with steroids for last month.  Borderline DM. Now last HGB AIC at 6.6.  Patient's last menstrual period was 08/03/1984 (approximate).          Sexually active: No.  The current method of family planning is status post hysterectomy.    Exercising: No.  The patient does not participate in regular exercise at present. Smoker:  no  Health Maintenance: Pap: 08/31/13, Negative MMG: 09/24/14, 3D, Bi Rads 2: Benign Colonoscopy: 2011 Dr. Collene Horton repeat in 5 yrs. BMD: 09/24/14 T Score, -1.0 Spine / -2.1 Right Femur Neck / -1.1 Left Femur Neck FRAX major 4.5%; hip 0.7% TDaP: 2015 Shingles: Never, declines Pneumonia: Prevnar 13, 07/2014, declined booster Hep C and HIV: will discuss with Dr. Alyson Horton Labs: Dr. Alyson Horton   reports that she has quit smoking. She has never used smokeless tobacco. She reports that she drinks alcohol. She reports that she does not use illicit drugs.  Past Medical History  Diagnosis Date  . Acid reflux   . Breast lump   . Chicken pox   . Measles   . Sinusitis   . Colon polyp   . Hypertension   . Arthritis   . Nipple discharge   . Night sweats   . Gum disease   . Thyroid disease     hypothyroidism  . Incontinence   . Bruises easily   . Prediabetes 06/2014    Past Surgical History  Procedure Laterality Date  . Tubal ligation    . Skin tag removal      brow and lid  . Thigh / knee soft tissue biopsy  09/05/2009  . Cholecystectomy  03/17/11  . Breast surgery  07/09/2008    mass removal  . Abdominal hysterectomy  1986    secondary to fibroids    Current Outpatient Prescriptions  Medication Sig Dispense Refill  . CALCIUM PO Take 1 tablet by mouth daily.      . cetirizine (ZYRTEC) 10 MG tablet Take 10 mg by mouth daily.      . Cholecalciferol (VITAMIN D PO) Take 50,000 Units by mouth once a week.     .  irbesartan (AVAPRO) 300 MG tablet     . levothyroxine (SYNTHROID, LEVOTHROID) 112 MCG tablet Take 1 tablet by mouth daily.    . Multiple Vitamins-Minerals (MULTIVITAMINS THER. W/MINERALS) TABS Take 1 tablet by mouth daily.    Marland Kitchen omeprazole (PRILOSEC) 40 MG capsule Take 1 capsule by mouth daily.    . Probiotic Product (PROBIOTIC FORMULA) CAPS Take by mouth.     No current facility-administered medications for this visit.    Family History  Problem Relation Age of Onset  . Hypertension Mother   . Cancer Mother     stomach  . Hypertension Father   . Cancer Father     prostate  . Hypertension Sister   . Hypertension Brother   . Alzheimer's disease Brother   . Hypertension Sister   . Heart failure Brother   . Hypertension Brother   . Breast cancer Daughter 41    ROS:  Pertinent items are noted in HPI.  Otherwise, a comprehensive ROS was negative.  Exam:   BP 130/74 mmHg  Pulse 92  Ht 5' 2.5" (1.588 m)  Wt 224 lb (101.606 kg)  BMI 40.29 kg/m2  LMP 08/03/1984 (Approximate)  Height: 5' 2.5" (158.8 cm) Ht Readings from Last 3 Encounters:  09/09/15 5' 2.5" (1.588 m)  04/26/15 5' 2.75" (1.594 m)  09/03/14 5' 2.75" (1.594 m)    General appearance: alert, cooperative and appears stated age Head: Normocephalic, without obvious abnormality, atraumatic Neck: no adenopathy, supple, symmetrical, trachea midline and thyroid normal to inspection and palpation Lungs: clear to auscultation bilaterally Breasts: normal appearance, no masses or tenderness Heart: regular rate and rhythm Abdomen: soft, non-tender; no masses,  no organomegaly Extremities: extremities normal, atraumatic, no cyanosis or edema Skin: Skin color, texture, turgor normal. No rashes or lesions Lymph nodes: Cervical, supraclavicular, and axillary nodes normal. No abnormal inguinal nodes palpated Neurologic: Grossly normal   Pelvic: External genitalia:  no lesions              Urethra:  normal appearing urethra with  no masses, tenderness or lesions              Bartholin's and Skene's: normal                 Vagina: normal appearing vagina with normal color and discharge, no lesions              Cervix: absent              Pap taken: No. Bimanual Exam:  Uterus:  uterus absent              Adnexa: no mass, fullness, tenderness               Rectovaginal: Confirms               Anus:  normal sphincter tone, no lesions  Chaperone present: no  A:  Well Woman with normal exam  S/P TAH 1986 secondary to fibroids off ERT 12/2009 Hypothyroid  History of Osteopenia  [place]:  History of pancreatic cancer   P:   Reviewed health and wellness pertinent to exam  Pap smear as above  Mammogram is due 09/2015  She will be getting colonoscopy this year  Counseled on breast self exam, mammography screening, adequate intake of calcium and vitamin D, diet and exercise, Kegel's exercises return annually or prn  An After Visit Summary was printed and given to the patient.

## 2015-09-09 NOTE — Patient Instructions (Addendum)

## 2015-09-11 NOTE — Progress Notes (Signed)
Encounter reviewed by Dr. Brook Amundson C. Silva.  

## 2015-10-01 ENCOUNTER — Ambulatory Visit
Admission: RE | Admit: 2015-10-01 | Discharge: 2015-10-01 | Disposition: A | Discharge status: Home / Self Care | Payer: Medicare Other | Source: Ambulatory Visit | Attending: Family Medicine | Admitting: Family Medicine

## 2015-10-01 ENCOUNTER — Other Ambulatory Visit: Payer: Self-pay | Admitting: Family Medicine

## 2015-10-01 DIAGNOSIS — R059 Cough, unspecified: Secondary | ICD-10-CM

## 2015-10-01 DIAGNOSIS — R05 Cough: Secondary | ICD-10-CM

## 2015-11-28 ENCOUNTER — Other Ambulatory Visit: Payer: Self-pay | Admitting: Pulmonary Disease

## 2015-11-28 ENCOUNTER — Encounter: Payer: Self-pay | Admitting: Pulmonary Disease

## 2015-11-28 ENCOUNTER — Other Ambulatory Visit (INDEPENDENT_AMBULATORY_CARE_PROVIDER_SITE_OTHER): Payer: Medicare Other

## 2015-11-28 ENCOUNTER — Ambulatory Visit (INDEPENDENT_AMBULATORY_CARE_PROVIDER_SITE_OTHER): Payer: Medicare Other | Admitting: Pulmonary Disease

## 2015-11-28 VITALS — BP 126/78 | HR 78 | Ht 62.0 in | Wt 220.4 lb

## 2015-11-28 DIAGNOSIS — R05 Cough: Secondary | ICD-10-CM | POA: Diagnosis not present

## 2015-11-28 DIAGNOSIS — J3089 Other allergic rhinitis: Secondary | ICD-10-CM

## 2015-11-28 DIAGNOSIS — R059 Cough, unspecified: Secondary | ICD-10-CM | POA: Insufficient documentation

## 2015-11-28 DIAGNOSIS — J309 Allergic rhinitis, unspecified: Secondary | ICD-10-CM | POA: Insufficient documentation

## 2015-11-28 LAB — CBC WITH DIFFERENTIAL/PLATELET
BASOS PCT: 0.4 % (ref 0.0–3.0)
Basophils Absolute: 0 10*3/uL (ref 0.0–0.1)
EOS PCT: 12.7 % — AB (ref 0.0–5.0)
Eosinophils Absolute: 1 10*3/uL — ABNORMAL HIGH (ref 0.0–0.7)
HCT: 40.8 % (ref 36.0–46.0)
HEMOGLOBIN: 13.4 g/dL (ref 12.0–15.0)
LYMPHS ABS: 2.2 10*3/uL (ref 0.7–4.0)
Lymphocytes Relative: 27.5 % (ref 12.0–46.0)
MCHC: 33 g/dL (ref 30.0–36.0)
MCV: 88.7 fl (ref 78.0–100.0)
MONO ABS: 0.6 10*3/uL (ref 0.1–1.0)
Monocytes Relative: 7.1 % (ref 3.0–12.0)
NEUTROS ABS: 4.3 10*3/uL (ref 1.4–7.7)
Neutrophils Relative %: 52.3 % (ref 43.0–77.0)
PLATELETS: 203 10*3/uL (ref 150.0–400.0)
RBC: 4.59 Mil/uL (ref 3.87–5.11)
RDW: 14.8 % (ref 11.5–15.5)
WBC: 8.1 10*3/uL (ref 4.0–10.5)

## 2015-11-28 NOTE — Patient Instructions (Signed)
   I will review the results of your blood work and sinus CT scan at your next appointment.  I will do a breathing test at yournext appointment in 4-6 weeks  Please call my office if you have any new breathing problems or questions before then.   TESTS ORDERED: 1. Full pulmonary function testing at follow-up appointment 2. Maxillofacial CT scan without contrast 3. Serum IgE, RAST panel, & CBC with differential today

## 2015-11-28 NOTE — Progress Notes (Signed)
Subjective:    Patient ID: Lisa Horton, female    DOB: 02/11/1946, 70 y.o.   MRN: [id number]  HPI She was seen in our office in 2012 for wheezing which spontaneously resolved. She reports she developed wheezing in November 2016 that has been intermittent since then, moreso when she is going to sleep. In November she had a dental procedure and spike in her blood pressure. She noticed that she had back pain as well. Her symptoms completely resolved except for her cough & wheeze. She reports she coughs intermittently throughout the day and it is nonproductive. She does cough more when laying recumbent, especially on her left side. She does cough and wheeze with exposure to fumes as well as exertion. Wheezing seems to be more significant than her cough. Denies any dyspnea. She has had increased sinus congestion & pressure since November as well. She does report post-nasal drainage. She reports her voice has changed as well and is rougher. Denies any reflux or dyspepsia. No morning brash water taste. Does report her throat is slightly sore.  Review of Systems No chest pain or pressure recently. No fever, chills, or sweats. A pertinent 14 point review of systems is negative except as per the history of presenting illness.  Allergies  Allergen Reactions  . Adhesive [Tape]   . Hydrogen Peroxide   . Latex   . Lidocaine     Elevated BP, was given during dental procedure  . Metrogel [Metronidazole] Other (See Comments)    Increase in vaginal burning.  Does better with Flagyl  . Neomycin-Bacitracin Zn-Polymyx   . Statins     They "cripple me"  . Sulfonamide Derivatives     Current Outpatient Prescriptions on File Prior to Visit  Medication Sig Dispense Refill  . CALCIUM PO Take 1 tablet by mouth daily.      . cetirizine (ZYRTEC) 10 MG tablet Take 10 mg by mouth daily.      . Cholecalciferol (VITAMIN D PO) Take 50,000 Units by mouth once a week.     . irbesartan (AVAPRO) 300 MG tablet Take 300  mg by mouth daily.     Marland Kitchen levothyroxine (SYNTHROID, LEVOTHROID) 112 MCG tablet Take 1 tablet by mouth daily.    . Multiple Vitamins-Minerals (MULTIVITAMINS THER. W/MINERALS) TABS Take 1 tablet by mouth daily.    Marland Kitchen omeprazole (PRILOSEC) 40 MG capsule Take 1 capsule by mouth daily.    . Probiotic Product (PROBIOTIC FORMULA) CAPS Take by mouth.     No current facility-administered medications on file prior to visit.    Past Medical History  Diagnosis Date  . Acid reflux   . Breast lump   . Chicken pox   . Measles   . Sinusitis   . Colon polyp   . Hypertension   . Arthritis   . Nipple discharge   . Night sweats   . Gum disease   . Thyroid disease     hypothyroidism  . Incontinence   . Bruises easily   . Prediabetes 06/2014  . Allergic rhinitis     Past Surgical History  Procedure Laterality Date  . Tubal ligation  1980  . Skin tag removal      brow and lid  . Thigh / knee soft tissue biopsy  09/05/2009  . Cholecystectomy  03/17/11  . Breast surgery  07/09/2008    mass removal  . Abdominal hysterectomy  1986    secondary to fibroids    Family History  Problem  Relation Age of Onset  . Hypertension Mother   . Cancer Mother     stomach  . Hypertension Father   . Prostate cancer Father   . Hypertension Sister   . Hypertension Brother   . Alzheimer's disease Brother   . Hypertension Sister   . Heart failure Brother   . Hypertension Brother   . Breast cancer Daughter 79  . Emphysema Father   . Allergies Sister   . Allergies Brother     Social History   Social History  . Marital Status: Single    Spouse Name: N/A  . Number of Children: N/A  . Years of Education: N/A   Social History Main Topics  . Smoking status: Former Smoker -- 1.00 packs/day for 48 years    Types: Cigarettes    Quit date: 08/03/2008  . Smokeless tobacco: Never Used  . Alcohol Use: 0.0 oz/week    0 Standard drinks or equivalent per week     Comment: rare  . Drug Use: No  . Sexual  Activity: No   Other Topics Concern  . 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 From [place] and has always lived in [place]. No international travel. No pets currently. No bird or mold exposure.          Objective:   Physical Exam BP 126/78 mmHg  Pulse 78  Ht 5\' 2"  (1.575 m)  Wt 220 lb 6.4 oz (99.973 kg)  BMI 40.30 kg/m2  SpO2 98%  LMP 08/03/1984 (Approximate) General:  Awake. Alert. Mild central obesity.  Integument:  Warm & dry. No rash on exposed skin. No bruising. Lymphatics:  No appreciated cervical or supraclavicular lymphadenoapthy. HEENT:  Moist mucus membranes. No oral ulcers. No scleral injection or icterus. Mild bilateral nasal turbinate swelling. No sinus tenderness to palpation Cardiovascular:  Regular rate. No edema. No appreciable JVD.  Pulmonary:  Good aeration & clear to auscultation bilaterally. Symmetric chest wall expansion. No accessory muscle use. Abdomen: Soft. Normal bowel sounds. Nondistended. Grossly nontender. Musculoskeletal:  Normal bulk and tone. Hand grip strength 5/5 bilaterally. No joint deformity or effusion appreciated. Neurological:  CN 2-12 grossly in tact. No meningismus. Moving all 4 extremities equally. Symmetric brachioradialis deep tendon reflexes. Psychiatric:  Mood and affect congruent. Speech normal rhythm, rate & tone.   PFT 10/30/10: FVC 2.29 L (82%) FEV1 1.75 L (86%) FEV1/FVC 0.77 FEF 25-75 1.37 L (87%) no bronchodilator response TLC 3.77 L (84%) RV 87% ERV 54% DLCO uncorrected 63%  IMAGING CXR P/LAT 10/01/15 (personally reviewed by me): No focal opacity or mass appreciated. No pleural effusion. Heart normal in size. Mediastinum normal in contour.    Assessment & Plan:  70 year old female with ongoing cough and wheezing since November 2016. With her sinus congestion and postnasal drainage. I  remain highly suspicious about this is the culprit for her voice changes and ongoing cough/wheezing. I personally reviewed her chest x-ray which does not show any parenchymal abnormality but given her previous spirometry which did show a reduced carbon monoxide diffusion capacity I feel it's reasonable to repeat pulmonary function testing. I instructed the patient to contact my office if she had any new breathing problems before her next appointment or questions.  1. Allergic rhinitis: Checking maxillofacial CT scan without contrast. Checking serum IgE, RAST panel, &  CBC with differential. Patient previously evaluated by ENT and somewhat dissatisfied with recommendations. 2. Cough/wheezing: Evaluating allergic rhinitis as above. Checking full pulmonary function testing at follow-up appointment. 3. Follow-up: Patient to return to clinic in 4-6 weeks or sooner if needed.  Sonia Baller Ashok Cordia, M.D. [facility] Pager:  [phone] After 3pm or if no response, call [phone] 3:31 PM 11/28/2015

## 2015-11-29 LAB — RESPIRATORY ALLERGY PROFILE REGION II ~~LOC~~
Allergen, Cedar tree, t12: <0.1 kU/L
Allergen, Comm Silver Birch, t9: <0.1 kU/L
Allergen, Cottonwood, t14: <0.1 kU/L
Allergen, D pternoyssinus,d7: 0.31 kU/L — ABNORMAL HIGH
Allergen, Mouse Urine Protein, e78: <0.1 kU/L
Allergen, Mulberry, t76: <0.1 kU/L
Allergen, Oak,t7: <0.1 kU/L
Alternaria Alternata: <0.1 kU/L
Aspergillus fumigatus, m3: <0.1 kU/L
Bermuda Grass: <0.1 kU/L
Box Elder IgE: <0.1 kU/L
Cat Dander: <0.1 kU/L
Cladosporium Herbarum: <0.1 kU/L
Cockroach: 0.13 kU/L — ABNORMAL HIGH
Common Ragweed: <0.1 kU/L
D. farinae: 0.26 kU/L — ABNORMAL HIGH
Dog Dander: <0.1 kU/L
Elm IgE: <0.1 kU/L
IgE (Immunoglobulin E), Serum: 146 kU/L — ABNORMAL HIGH (ref <115)
Johnson Grass: <0.1 kU/L
Pecan/Hickory Tree IgE: <0.1 kU/L
Penicillium Notatum: <0.1 kU/L
Rough Pigweed  IgE: <0.1 kU/L
Sheep Sorrel IgE: <0.1 kU/L
Timothy Grass: <0.1 kU/L

## 2015-12-02 ENCOUNTER — Telehealth: Payer: Self-pay | Admitting: Pulmonary Disease

## 2015-12-02 ENCOUNTER — Ambulatory Visit (INDEPENDENT_AMBULATORY_CARE_PROVIDER_SITE_OTHER)
Admission: RE | Admit: 2015-12-02 | Discharge: 2015-12-02 | Disposition: A | Discharge status: Home / Self Care | Payer: Medicare Other | Source: Ambulatory Visit | Attending: Pulmonary Disease | Admitting: Pulmonary Disease

## 2015-12-02 DIAGNOSIS — R05 Cough: Secondary | ICD-10-CM

## 2015-12-02 DIAGNOSIS — J3089 Other allergic rhinitis: Secondary | ICD-10-CM

## 2015-12-02 DIAGNOSIS — R059 Cough, unspecified: Secondary | ICD-10-CM

## 2015-12-02 NOTE — Telephone Encounter (Signed)
Returning call. Can be reached at [phone]

## 2015-12-02 NOTE — Telephone Encounter (Signed)
lmtcb X1 for pt. No inhaler on pt's med list..

## 2015-12-02 NOTE — Telephone Encounter (Signed)
Spoke with pt, states that her PCP has put her on breo and albuterol-states that these work well for her, but breo is too expensive and she would like an alternative inhaler.  I advised pt that we have no inhalers on file for her.  Pt requesting a refill on albuterol inhaler as well as alternative to breo.   Pt uses [facility] on [address].    JN please advise.  Thanks!

## 2015-12-03 MED ORDER — ALBUTEROL SULFATE 108 (90 BASE) MCG/ACT IN AEPB: 2.0000 | INHALATION_SPRAY | RESPIRATORY_TRACT | Status: DC | PRN

## 2015-12-03 NOTE — Telephone Encounter (Signed)
Patient notified of Dr. Ammie Dalton recommendations.   Patient states that she was given sample of ProAir respiclick and would like another sample. Per [employer] ok for Albuterol inhaler. Left sample of ProAir Respiclick at front desk for patient to pick up. Patient aware. Nothing further needed.

## 2015-12-03 NOTE — Telephone Encounter (Signed)
Her sinus CT seems more consistent with chronic sinusitis. Not sure what the [facility] would be treating. Ideally I would like her to be off of Breo for her pulmonary function testing. We can refill her choice albuterol inhaler - 2 puffs every 4 hours as needed #1 with 3 refills for now. Please let her know she should call our office if her cough or wheezing worsen after stopping the [facility] as we may need to do her PFTs sooner. Thanks.

## 2016-01-06 ENCOUNTER — Ambulatory Visit (HOSPITAL_COMMUNITY)
Admission: RE | Admit: 2016-01-06 | Discharge: 2016-01-06 | Disposition: A | Discharge status: Home / Self Care | Payer: Medicare Other | Source: Ambulatory Visit | Attending: Pulmonary Disease | Admitting: Pulmonary Disease

## 2016-01-06 ENCOUNTER — Ambulatory Visit (INDEPENDENT_AMBULATORY_CARE_PROVIDER_SITE_OTHER): Payer: Medicare Other | Admitting: Pulmonary Disease

## 2016-01-06 ENCOUNTER — Telehealth: Payer: Self-pay | Admitting: Pulmonary Disease

## 2016-01-06 ENCOUNTER — Encounter: Payer: Self-pay | Admitting: Pulmonary Disease

## 2016-01-06 VITALS — BP 130/68 | HR 98 | Ht 62.0 in | Wt 219.8 lb

## 2016-01-06 DIAGNOSIS — R05 Cough: Secondary | ICD-10-CM | POA: Insufficient documentation

## 2016-01-06 DIAGNOSIS — J309 Allergic rhinitis, unspecified: Secondary | ICD-10-CM

## 2016-01-06 DIAGNOSIS — R062 Wheezing: Secondary | ICD-10-CM

## 2016-01-06 DIAGNOSIS — K219 Gastro-esophageal reflux disease without esophagitis: Secondary | ICD-10-CM | POA: Diagnosis not present

## 2016-01-06 DIAGNOSIS — R059 Cough, unspecified: Secondary | ICD-10-CM

## 2016-01-06 DIAGNOSIS — J3089 Other allergic rhinitis: Secondary | ICD-10-CM | POA: Diagnosis present

## 2016-01-06 LAB — PULMONARY FUNCTION TEST
DL/VA % PRED: 85 %
DL/VA: 3.87 ml/min/mmHg/L
DLCO UNC % PRED: 59 %
DLCO UNC: 12.73 ml/min/mmHg
FEF 25-75 POST: 1.38 L/s
FEF 25-75 PRE: 1.54 L/s
FEF2575-%CHANGE-POST: -9 %
FEF2575-%PRED-POST: 83 %
FEF2575-%PRED-PRE: 93 %
FEV1-%CHANGE-POST: -2 %
FEV1-%PRED-POST: 80 %
FEV1-%Pred-Pre: 82 %
FEV1-POST: 1.4 L
FEV1-Pre: 1.43 L
FEV1FVC-%Change-Post: 0 %
FEV1FVC-%PRED-PRE: 107 %
FEV6-%CHANGE-POST: -2 %
FEV6-%PRED-POST: 77 %
FEV6-%Pred-Pre: 80 %
FEV6-POST: 1.66 L
FEV6-Pre: 1.71 L
FEV6FVC-%PRED-POST: 104 %
FEV6FVC-%Pred-Pre: 104 %
FVC-%CHANGE-POST: -2 %
FVC-%PRED-POST: 74 %
FVC-%Pred-Pre: 76 %
FVC-Post: 1.66 L
FVC-Pre: 1.71 L
POST FEV1/FVC RATIO: 84 %
PRE FEV1/FVC RATIO: 84 %
PRE FEV6/FVC RATIO: 100 %
Post FEV6/FVC ratio: 100 %
RV % PRED: 157 %
RV: 3.22 L
TLC % pred: 111 %
TLC: 5.29 L

## 2016-01-06 MED ORDER — ALBUTEROL SULFATE (2.5 MG/3ML) 0.083% IN NEBU
2.5000 mg | INHALATION_SOLUTION | Freq: Once | RESPIRATORY_TRACT | Status: AC
  Administered 2016-01-06: 2.5 mg via RESPIRATORY_TRACT

## 2016-01-06 MED ORDER — FLUTICASONE PROPIONATE 50 MCG/ACT NA SUSP: 1.0000 | Freq: Two times a day (BID) | NASAL | Status: DC

## 2016-01-06 NOTE — Telephone Encounter (Signed)
IMAGING MAXILLOFACIAL CT W/O 12/02/15 (per radiologist): Slight mucosal thickening throughout paranasal sinuses without air-fluid level. No soft tissue or bony abnormality. Compatible with mild chronic sinusitis.  LABS 11/28/15 CBC: 8.1/13.4/40.8/203 Eosinophil: 1.0 RAST Panel:  Cockroach 0.13 / D farinae 0.26 / D pteronyssinus 0.31 IgE:  146

## 2016-01-06 NOTE — Progress Notes (Signed)
Subjective:    Patient ID: Lisa Horton, female    DOB: October 11, 1945, 70 y.o.   MRN: [id number]  C.C.:  Follow-up for Cough/Wheezing, Allergic Rhinitis, & GERD.  HPI Cough/Wheezing:  She reports her coughing & wheezing are unchanged. She is waking up at night coughing & wheezing. She reports she coughs more when she first lays down at night. No dyspnea.   Allergic Rhinitis:  Maxillofacial CT scan shows mild chronic sinusitis. Serum IgE elevated. Previously evaluated by ENT. Continuing to have significant post-nasal drainage. Denies any sinus pressure or pain. She has been on Zyrtec for years. Previously has only used nasal steroid sprays intermittently, not regularly.   GERD:  Only occasional reflux or dyspepsia. She is continuing to use her Prilosec daily. No morning brash water taste.  Review of Systems She reports she has had occasional pain in her chest that she describes a "strain" in her chest wall. No other chest tightness or pain. No fever, chills, or sweats. No rashes or bruising.  Allergies  Allergen Reactions  . Adhesive [Tape]   . Hydrogen Peroxide   . Latex   . Lidocaine     Elevated BP, was given during dental procedure  . Metrogel [Metronidazole] Other (See Comments)    Increase in vaginal burning.  Does better with Flagyl  . Neomycin-Bacitracin Zn-Polymyx   . Statins     They "cripple me"  . Sulfonamide Derivatives     Current Outpatient Prescriptions on File Prior to Visit  Medication Sig Dispense Refill  . Albuterol Sulfate (PROAIR RESPICLICK) 123XX123 (90 Base) MCG/ACT AEPB Inhale 2 puffs into the lungs every 4 (four) hours as needed. 1 each 0  . CALCIUM PO Take 1 tablet by mouth daily. Reported on 01/06/2016    . cetirizine (ZYRTEC) 10 MG tablet Take 10 mg by mouth daily.      . Cholecalciferol (VITAMIN D PO) Take 50,000 Units by mouth once a week.     . irbesartan (AVAPRO) 300 MG tablet Take 300 mg by mouth daily.     Marland Kitchen levothyroxine (SYNTHROID, LEVOTHROID) 112  MCG tablet Take 1 tablet by mouth daily.    . Multiple Vitamins-Minerals (MULTIVITAMINS THER. W/MINERALS) TABS Take 1 tablet by mouth daily.    Marland Kitchen omeprazole (PRILOSEC) 40 MG capsule Take 1 capsule by mouth daily.    . Probiotic Product (PROBIOTIC FORMULA) CAPS Take by mouth daily.      No current facility-administered medications on file prior to visit.    Past Medical History  Diagnosis Date  . Acid reflux   . Breast lump   . Chicken pox   . Measles   . Sinusitis   . Colon polyp   . Hypertension   . Arthritis   . Nipple discharge   . Night sweats   . Gum disease   . Thyroid disease     hypothyroidism  . Incontinence   . Bruises easily   . Prediabetes 06/2014  . Allergic rhinitis     Past Surgical History  Procedure Laterality Date  . Tubal ligation  1980  . Skin tag removal      brow and lid  . Thigh / knee soft tissue biopsy  09/05/2009  . Cholecystectomy  03/17/11  . Breast surgery  07/09/2008    mass removal  . Abdominal hysterectomy  1986    secondary to fibroids    Family History  Problem Relation Age of Onset  . Hypertension Mother   . Cancer  Mother     stomach  . Hypertension Father   . Prostate cancer Father   . Hypertension Sister   . Hypertension Brother   . Alzheimer's disease Brother   . Hypertension Sister   . Heart failure Brother   . Hypertension Brother   . Breast cancer Daughter 54  . Emphysema Father   . Allergies Sister   . Allergies Brother     Social History   Social History  . Marital Status: Single    Spouse Name: N/A  . Number of Children: N/A  . Years of Education: N/A   Social History Main Topics  . Smoking status: Former Smoker -- 1.00 packs/day for 48 years    Types: Cigarettes    Quit date: 08/03/2008  . Smokeless tobacco: Never Used  . Alcohol Use: 0.0 oz/week    0 Standard drinks or equivalent per week     Comment: rare  . Drug Use: No  . Sexual Activity: No   Other Topics Concern  . 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 From [place] and has always lived in [place]. No international travel. No pets currently. No bird or mold exposure.          Objective:   Physical Exam BP 130/68 mmHg  Pulse 98  Ht 5\' 2"  (1.575 m)  Wt 219 lb 12.8 oz (99.701 kg)  BMI 40.19 kg/m2  SpO2 96%  LMP 08/03/1984 (Approximate) General:  Awake. Alert. Mild central obesity. No distress. Integument:  Warm & dry. No rash on exposed skin. No bruising. HEENT:  Moist mucus membranes. No oral ulcers. No scleral injection. Nasal turbinate swelling persists and is unchanged. Cardiovascular:  Regular rate. No edema. No appreciable JVD. Normal S1 & S2. Pulmonary:  Mild diffuse expiratory wheeze. Normal work of breathing on room air. Speaking in complete sentences. Abdomen: Soft. Normal bowel sounds. Nontender.  PFT 01/06/16: FVC 1.71 L (76%) FEV1 1.43 L (82%) FEV1/FVC 0.84 FEF 25-75 1.54 L (93%) no bronchodilator response TLC 5.29 L (111%) RV 157% ERV 127% DLCO uncorrected 59% 10/30/10: FVC 2.29 L (82%) FEV1 1.75 L (86%) FEV1/FVC 0.77 FEF 25-75 1.37 L (87%) no bronchodilator response TLC 3.77 L (84%) RV 87% ERV 54% DLCO uncorrected 63%  IMAGING MAXILLOFACIAL CT W/O 12/02/15 (per radiologist): Slight mucosal thickening throughout paranasal sinuses without air-fluid level. No soft tissue or bony abnormality. Compatible with mild chronic sinusitis.  CXR P/LAT 10/01/15 (personally reviewed by me): No focal opacity or mass appreciated. No pleural effusion. Heart normal in size. Mediastinum normal in contour.  LABS 11/28/15 CBC: 8.1/13.4/40.8/203 Eosinophil: 1.0 RAST Panel: Cockroach 0.13 / D farinae 0.26 / D pteronyssinus 0.31 IgE: 146    Assessment & Plan:  70 year old female with ongoing cough and wheezing since November 2016. Findings on her maxillofacial CT scan are highly  suggestive of chronic sinusitis with postnasal drainage as a cause for her cough. However, wheezing found on physical exam today certainly raises the question of whether or not there could be occult asthma contributing to the patient's symptoms. Reflux seems to be well-controlled on Prilosec alone at this time and I feel is noncontributory. The patient did question the potential need for antibiotic therapy but I would prefer to maximize her treatment for allergic rhinitis and instructed her to contact our office  if she develops any new infectious symptoms or does not clinically improve after 1-2 weeks of this regimen.  1. Cough/Wheezing: Checking methacholine challenge test to rule out underlying asthma. 2. Allergic rhinitis: Starting patient on nasal saline rinse twice daily followed by Flonase twice daily. Patient advised she could try stopping Zyrtec. She will contact our office if she develops any new symptoms. Holding on antibiotic therapy at this time. 3. GERD: Well-controlled with Prilosec. No changes. 4. Follow-up: Patient to return to clinic in 4-8 weeks or sooner if needed.  Sonia Baller Ashok Cordia, M.D. [facility] Pager:  [phone] After 3pm or if no response, call [phone] 4:30 PM 01/06/2016

## 2016-01-06 NOTE — Addendum Note (Signed)
Addended by: Raymondo Band D on: 01/06/2016 04:40 PM   Modules accepted: Orders

## 2016-01-06 NOTE — Patient Instructions (Signed)
   Use an over-the-counter nasal saline rinse twice daily before your Flonase. I recommend NeilMed.  Use your flonase in each nostril twice daily. Remember to point the tip away from your nasal septum to prevent nose bleeds.  If you're still having a cough after 1-2 weeks let me know. Also let me know if you develop any sinus pressure, sinus pain, fever, chills, or sweats.  I will see you back in 4-8 weeks or sooner if needed.  TESTS ORDERED: 1. Methacholine challenge test

## 2016-01-13 ENCOUNTER — Encounter (HOSPITAL_COMMUNITY): Payer: Medicare Other

## 2016-01-27 ENCOUNTER — Telehealth: Payer: Self-pay | Admitting: Pulmonary Disease

## 2016-01-27 ENCOUNTER — Ambulatory Visit (HOSPITAL_COMMUNITY)
Admission: RE | Admit: 2016-01-27 | Discharge: 2016-01-27 | Disposition: A | Discharge status: Home / Self Care | Payer: Medicare Other | Source: Ambulatory Visit | Attending: Pulmonary Disease | Admitting: Pulmonary Disease

## 2016-01-27 DIAGNOSIS — R05 Cough: Secondary | ICD-10-CM

## 2016-01-27 DIAGNOSIS — R062 Wheezing: Secondary | ICD-10-CM

## 2016-01-27 DIAGNOSIS — J309 Allergic rhinitis, unspecified: Secondary | ICD-10-CM

## 2016-01-27 DIAGNOSIS — R059 Cough, unspecified: Secondary | ICD-10-CM

## 2016-01-27 NOTE — Telephone Encounter (Signed)
Pulmonary rehab calling needing to speak urgently to Dr. Ashok Cordia.  Gave Cell phone number. FYI to Dr. Ashok Cordia

## 2016-01-28 NOTE — Telephone Encounter (Signed)
Will route message to [facility]

## 2016-01-29 NOTE — Telephone Encounter (Signed)
No one called from pulm rehab. Resp Therapy called on 6/26 though. JN

## 2016-01-29 NOTE — Telephone Encounter (Signed)
I called pulm rehab and was advised they did not call. Will sign off message

## 2016-01-29 NOTE — Telephone Encounter (Signed)
JN did you speak with Pulmonary Rehab?

## 2016-02-10 ENCOUNTER — Ambulatory Visit (HOSPITAL_COMMUNITY)
Admission: RE | Admit: 2016-02-10 | Discharge: 2016-02-10 | Disposition: A | Discharge status: Home / Self Care | Payer: Medicare Other | Source: Ambulatory Visit | Attending: Pulmonary Disease | Admitting: Pulmonary Disease

## 2016-02-10 ENCOUNTER — Encounter: Payer: Self-pay | Admitting: Nurse Practitioner

## 2016-02-10 DIAGNOSIS — R062 Wheezing: Secondary | ICD-10-CM | POA: Diagnosis not present

## 2016-02-10 DIAGNOSIS — R05 Cough: Secondary | ICD-10-CM | POA: Insufficient documentation

## 2016-02-10 DIAGNOSIS — J309 Allergic rhinitis, unspecified: Secondary | ICD-10-CM | POA: Insufficient documentation

## 2016-02-10 MED ORDER — SODIUM CHLORIDE 0.9 % IN NEBU
3.0000 mL | INHALATION_SOLUTION | Freq: Once | RESPIRATORY_TRACT | Status: AC
  Administered 2016-02-10: 3 mL via RESPIRATORY_TRACT

## 2016-02-10 MED ORDER — METHACHOLINE 16 MG/ML NEB SOLN: 2.0000 mL | Freq: Once | RESPIRATORY_TRACT | Status: DC

## 2016-02-10 MED ORDER — ALBUTEROL SULFATE (2.5 MG/3ML) 0.083% IN NEBU
2.5000 mg | INHALATION_SOLUTION | Freq: Once | RESPIRATORY_TRACT | Status: AC
  Administered 2016-02-10: 2.5 mg via RESPIRATORY_TRACT

## 2016-02-10 MED ORDER — METHACHOLINE 1 MG/ML NEB SOLN
2.0000 mL | Freq: Once | RESPIRATORY_TRACT | Status: AC
  Administered 2016-02-10: 2 mg via RESPIRATORY_TRACT

## 2016-02-10 MED ORDER — METHACHOLINE 4 MG/ML NEB SOLN: 2.0000 mL | Freq: Once | RESPIRATORY_TRACT | Status: DC

## 2016-02-10 MED ORDER — METHACHOLINE 0.0625 MG/ML NEB SOLN
2.0000 mL | Freq: Once | RESPIRATORY_TRACT | Status: AC
  Administered 2016-02-10: 0.125 mg via RESPIRATORY_TRACT

## 2016-02-10 MED ORDER — METHACHOLINE 0.25 MG/ML NEB SOLN
2.0000 mL | Freq: Once | RESPIRATORY_TRACT | Status: AC
  Administered 2016-02-10: 0.5 mg via RESPIRATORY_TRACT

## 2016-02-24 ENCOUNTER — Ambulatory Visit: Payer: Medicare Other | Admitting: Pulmonary Disease

## 2016-09-11 NOTE — Progress Notes (Signed)
Patient ID: Lisa Horton, female   DOB: [birth year], 71 y.o.   MRN: [id number]  71 y.o. [id number] Single  African American Fe here for annual exam.  Some recent flare of bronchitis.  Retired about 4 yrs ago.  She is not dating and not SA.  Patient's last menstrual period was 08/03/1984 (approximate).          Sexually active: No.  The current method of family planning is status post hysterectomy.    Exercising: No.  The patient does not participate in regular exercise at present. Smoker:  no  Health Maintenance: Pap: 08/31/13, Negative, no other history in EPIC MMG: 01/29/16, 3D, Bi Rads 2: Benign Colonoscopy: 2011 Dr. Collene Mares, repeat in 5 years BMD: 09/24/14 T Score, -1.0 Spine / -2.1 Right Femur Neck / -1.1 Left Femur Neck TDaP: 2015 Shingles: Never, declines Pneumonia: 07/2014 Prevnar-13, declines booster Hep C and HIV: discuss today Labs: PCP takes care of all labs   reports that she quit smoking about 8 years ago. Her smoking use included Cigarettes. She has a 48.00 pack-year smoking history. She has never used smokeless tobacco. She reports that she drinks alcohol. She reports that she does not use drugs.  Past Medical History:  Diagnosis Date  . Acid reflux   . Allergic rhinitis   . Arthritis   . Breast lump   . Bruises easily   . Chicken pox   . Colon polyp   . Gum disease   . Hypertension   . Incontinence   . Measles   . Night sweats   . Nipple discharge   . Prediabetes 06/2014  . Sinusitis   . Thyroid disease    hypothyroidism    Past Surgical History:  Procedure Laterality Date  . ABDOMINAL HYSTERECTOMY  1986   secondary to fibroids  . BREAST SURGERY  07/09/2008   mass removal  . CHOLECYSTECTOMY  03/17/11  . SKIN TAG REMOVAL     brow and lid  . THIGH / KNEE SOFT TISSUE BIOPSY  09/05/2009  . TUBAL LIGATION  1980    Current Outpatient Prescriptions  Medication Sig Dispense Refill  . Albuterol Sulfate (PROAIR RESPICLICK) 123XX123 (90 Base) MCG/ACT AEPB Inhale 2  puffs into the lungs every 4 (four) hours as needed. 1 each 0  . CALCIUM PO Take 1 tablet by mouth daily. Reported on 01/06/2016    . cetirizine (ZYRTEC) 10 MG tablet Take 10 mg by mouth daily.      . Cholecalciferol (VITAMIN D PO) Take 50,000 Units by mouth once a week.     . fluticasone (FLONASE) 50 MCG/ACT nasal spray Place 1 spray into both nostrils 2 (two) times daily. 16 g 3  . irbesartan (AVAPRO) 300 MG tablet Take 300 mg by mouth daily.     Marland Kitchen levothyroxine (SYNTHROID, LEVOTHROID) 112 MCG tablet Take 1 tablet by mouth daily.    . Multiple Vitamins-Minerals (MULTIVITAMINS THER. W/MINERALS) TABS Take 1 tablet by mouth daily.    Marland Kitchen omeprazole (PRILOSEC) 40 MG capsule Take 1 capsule by mouth daily.    . Probiotic Product (PROBIOTIC FORMULA) CAPS Take by mouth daily.      No current facility-administered medications for this visit.     Family History  Problem Relation Age of Onset  . Hypertension Mother   . Cancer Mother     stomach  . Hypertension Father   . Prostate cancer Father   . Emphysema Father   . Hypertension Sister   . Hypertension Brother   .  Alzheimer's disease Brother   . Hypertension Sister   . Heart failure Brother   . Hypertension Brother   . Breast cancer Daughter 46  . Allergies Sister   . Allergies Brother     ROS:  Pertinent items are noted in HPI.  Otherwise, a comprehensive ROS was negative.  Exam:   BP (!) 144/78 (BP Location: Right Arm, Patient Position: Sitting, Cuff Size: Normal)   Pulse 68   Resp 16   Ht 5\' 2"  (1.575 m)   Wt 219 lb (99.3 kg)   LMP 08/03/1984 (Approximate)   BMI 40.06 kg/m  Height: 5\' 2"  (157.5 cm) Ht Readings from Last 3 Encounters:  09/14/16 5\' 2"  (1.575 m)  01/06/16 5\' 2"  (1.575 m)  11/28/15 5\' 2"  (1.575 m)    General appearance: alert, cooperative and appears stated age Head: Normocephalic, without obvious abnormality, atraumatic Neck: no adenopathy, supple, symmetrical, trachea midline and thyroid normal to inspection  and palpation Lungs: clear to auscultation bilaterally Breasts: normal appearance, no masses or tenderness Heart: regular rate and rhythm Abdomen: soft, non-tender; no masses,  no organomegaly Extremities: extremities normal, atraumatic, no cyanosis or edema Skin: Skin color, texture, turgor normal. No rashes or lesions Lymph nodes: Cervical, supraclavicular, and axillary nodes normal. No abnormal inguinal nodes palpated Neurologic: Grossly normal   Pelvic: External genitalia:  no lesions              Urethra:  normal appearing urethra with no masses, tenderness or lesions              Bartholin's and Skene's: normal                 Vagina: normal appearing vagina with normal color and discharge, no lesions              Cervix: absent              Pap taken: Yes.  per request Bimanual Exam:  Uterus:  uterus absent              Adnexa: no mass, fullness, tenderness               Rectovaginal: Confirms               Anus:  normal sphincter tone, no lesions  Chaperone present: yes  A:  Well Woman with normal exam      S/P TAH 1986 secondary to fibroids off ERT 12/2009 Hypothyroid             History of Osteopenia             FMH:  History of pancreatic cancer   P:   Reviewed health and wellness pertinent to exam  Pap smear done as requested  Mammogram is due 01/2017  Order sent to [facility] for BMD  Will check labs and follow  Pt declines Shingles  Pt declines scheduling for colonoscopy at this time - due to bronchitis - will decide later and call on her own.  Counseled on breast self exam, mammography screening, adequate intake of calcium and vitamin D, diet and exercise, Kegel's exercises return annually or prn  An After Visit Summary was printed and given to the patient.

## 2016-09-14 ENCOUNTER — Encounter: Payer: Self-pay | Admitting: Nurse Practitioner

## 2016-09-14 ENCOUNTER — Ambulatory Visit (INDEPENDENT_AMBULATORY_CARE_PROVIDER_SITE_OTHER): Payer: Medicare Other | Admitting: Nurse Practitioner

## 2016-09-14 VITALS — BP 144/78 | HR 68 | Resp 16 | Ht 62.0 in | Wt 219.0 lb

## 2016-09-14 DIAGNOSIS — Z0000 Encounter for general adult medical examination without abnormal findings: Secondary | ICD-10-CM

## 2016-09-14 DIAGNOSIS — M8588 Other specified disorders of bone density and structure, other site: Secondary | ICD-10-CM | POA: Diagnosis not present

## 2016-09-14 DIAGNOSIS — Z01419 Encounter for gynecological examination (general) (routine) without abnormal findings: Secondary | ICD-10-CM | POA: Diagnosis not present

## 2016-09-14 NOTE — Patient Instructions (Signed)

## 2016-09-15 LAB — HEPATITIS C ANTIBODY: HCV AB: NEGATIVE

## 2016-09-15 NOTE — Progress Notes (Signed)
Encounter reviewed by Dr. London Tarnowski Amundson C. Silva.  

## 2016-09-16 ENCOUNTER — Telehealth: Payer: Self-pay | Admitting: *Deleted

## 2016-09-16 LAB — IPS PAP SMEAR ONLY

## 2016-09-16 NOTE — Telephone Encounter (Signed)
-----   Message from [place], [place] sent at 09/15/2016  3:40 PM EST ----- Please call pt and let her know that Hep C was negative as expected.

## 2016-09-16 NOTE — Telephone Encounter (Signed)
I have attempted to contact this patient by phone with the following results: left message to return call to [place] at [phone] answering machine (home).  Not OK to leave detailed message per DPR.  [phone] (Home) *Preferred*

## 2016-09-18 NOTE — Telephone Encounter (Signed)
Patient returned call

## 2016-09-21 NOTE — Telephone Encounter (Signed)
Pt notified in result note.  Closing encounter. 

## 2017-01-28 DIAGNOSIS — T7840XA Allergy, unspecified, initial encounter: Secondary | ICD-10-CM | POA: Insufficient documentation

## 2017-02-24 ENCOUNTER — Telehealth: Payer: Self-pay | Admitting: Obstetrics and Gynecology

## 2017-02-24 NOTE — Telephone Encounter (Signed)
Left message on voicemail to call and reschedule cancelled appointment. Mail letter °

## 2017-03-11 ENCOUNTER — Ambulatory Visit: Payer: Medicare Other | Admitting: Allergy & Immunology

## 2017-03-11 NOTE — Progress Notes (Deleted)
NEW PATIENT  Date of Service/Encounter:  03/11/17  Referring provider: Maury Dus, MD   Assessment:   No diagnosis found.   Asthma Reportables:  Severity: {Blank single:19197::"intermittent","moderate persistent","severe persistent","mild persistent"}  Risk: {DESC; LOW/MEDIUM/HIGH:23084} Control: {Asthma Control (Reporting):20434}  Seasonal Influenza Vaccine: {Blank single:19197::"no but encouraged","refused","yes"}   PPSV-23 Vaccine (CDC recommended for patients with persistent asthma 19-64yo): {Responses; yes/no/refused:32142}    Plan/Recommendations:    There are no Patient Instructions on file for this visit.    Subjective:   Lisa Horton is a 71 y.o. female presenting today for evaluation of No chief complaint on file.   Lisa Horton has a history of the following: Patient Active Problem List   Diagnosis Date Noted  . Allergic rhinitis 11/28/2015  . Cough 11/28/2015  . Breast pain 08/25/2012  . Left breast mass 10/29/2011  . Symptomatic cholecystitis 02/24/2011  . HYPOTHYROIDISM 10/02/2010  . HYPERTENSION 10/02/2010  . G E R D 10/02/2010  . OSTEOARTHRITIS 10/02/2010    History obtained from: chart review and ***.  Lisa Horton was referred by Maury Dus, MD.     Lisa Horton is a 71 y.o. female presenting for ***.      Asthma/Respiratory Symptom History:   Allergic Rhinitis Symptom History:    Food Allergy Symptom History:   ***Otherwise, there is no history of other atopic diseases, including asthma, drug allergies, food allergies, environmental allergies, stinging insect allergies, or urticaria. There is no significant infectious history. ***Vaccinations are up to date.    Past Medical History: Patient Active Problem List   Diagnosis Date Noted  . Allergic rhinitis 11/28/2015  . Cough 11/28/2015  . Breast pain 08/25/2012  . Left breast mass 10/29/2011  . Symptomatic cholecystitis 02/24/2011  . HYPOTHYROIDISM 10/02/2010    . HYPERTENSION 10/02/2010  . G E R D 10/02/2010  . OSTEOARTHRITIS 10/02/2010    Medication List:  Allergies as of 03/11/2017      Reactions   Adhesive [tape]    Hydrogen Peroxide    Latex    Lidocaine    Elevated BP, was given during dental procedure   Metrogel [metronidazole] Other (See Comments)   Increase in vaginal burning.  Does better with Flagyl   Neomycin-bacitracin Zn-polymyx    Statins    They "cripple me"   Sulfonamide Derivatives       Medication List       Accurate as of 03/11/17  1:43 PM. Always use your most recent med list.          Albuterol Sulfate 108 (90 Base) MCG/ACT Aepb Commonly known as:  PROAIR RESPICLICK Inhale 2 puffs into the lungs every 4 (four) hours as needed.   CALCIUM PO Take 1 tablet by mouth daily. Reported on 01/06/2016   cetirizine 10 MG tablet Commonly known as:  ZYRTEC Take 10 mg by mouth daily.   fluticasone 50 MCG/ACT nasal spray Commonly known as:  FLONASE Place 1 spray into both nostrils 2 (two) times daily.   irbesartan 300 MG tablet Commonly known as:  AVAPRO Take 300 mg by mouth daily.   levothyroxine 112 MCG tablet Commonly known as:  SYNTHROID, LEVOTHROID Take 1 tablet by mouth daily.   multivitamins ther. w/minerals Tabs tablet Take 1 tablet by mouth daily.   omeprazole 40 MG capsule Commonly known as:  PRILOSEC Take 1 capsule by mouth daily.   PROBIOTIC FORMULA Caps Take by mouth daily.   VITAMIN D PO Take 50,000 Units by mouth once a week.  Birth History: ***non-contributory. Born at term without complications.   Developmental History: Janyra has met all milestones on time. She has required no speech therapy, occupational therapy, or physical therapy. ***non-contributory.   Past Surgical History: Past Surgical History:  Procedure Laterality Date  . ABDOMINAL HYSTERECTOMY  1986   secondary to fibroids  . BREAST SURGERY  07/09/2008   mass removal  . CHOLECYSTECTOMY  03/17/11  . SKIN TAG  REMOVAL     brow and lid  . THIGH / KNEE SOFT TISSUE BIOPSY  09/05/2009  . TUBAL LIGATION  1980     Family History: Family History  Problem Relation Age of Onset  . Hypertension Mother   . Cancer Mother        stomach  . Hypertension Father   . Prostate cancer Father   . Emphysema Father   . Hypertension Sister   . Hypertension Brother   . Alzheimer's disease Brother   . Hypertension Sister   . Heart failure Brother   . Hypertension Brother   . Breast cancer Daughter 22  . Allergies Sister   . Allergies Brother      Social History: Zell lives at home with ***.     Review of Systems: a 14-point review of systems is pertinent for what is mentioned in HPI.  Otherwise, all other systems were negative. Constitutional: negative other than that listed in the HPI Eyes: negative other than that listed in the HPI Ears, nose, mouth, throat, and face: negative other than that listed in the HPI Respiratory: negative other than that listed in the HPI Cardiovascular: negative other than that listed in the HPI Gastrointestinal: negative other than that listed in the HPI Genitourinary: negative other than that listed in the HPI Integument: negative other than that listed in the HPI Hematologic: negative other than that listed in the HPI Musculoskeletal: negative other than that listed in the HPI Neurological: negative other than that listed in the HPI Allergy/Immunologic: negative other than that listed in the HPI    Objective:   Last menstrual period 08/03/1984. There is no height or weight on file to calculate BMI.   Physical Exam:  General: Alert, interactive, in no acute distress. Eyes: {Blank multiple:19196::"No conjunctival injection present on the right","No conjunctival injection present on the left","Conjunctival injection on the right with limbal sparing","Conjunctival injection on the left with limbal sparing","PERRL bilaterally","No discharge on the right","No  discharge on the left","No Horner-Trantas dots present","allergic shiners present bilaterally","***"} Ears: {Blank multiple:19196::"Right TM pearly gray with normal light reflex","Left TM pearly gray with normal light reflex","Right TM erythematous but not bulging","Left TM erythematous but not bulging","Right TM erythematous and bulging","Left TM erythematous and bulging","Right OME","Left OME","Right TM intact without perforation","Left TM intact without perforation","Right TM unable to be visualized due to cerumen impaction","Left TM unable to be visualized due to cerumen impaction","***"}.  Nose/Throat: {Blank multiple:19196::"External nose within normal limits","nasal crease present","septum midline"}, turbinates {Blank single:19197::"non-edematous","edematous","edematous and pale","markedly edematous","markedly edematous and pale","moderately edematous","mildly edematous","minimally edematous"} {Blank single:19197::"with crusty discharge","with thick discharge","with clear discharge","without discharge"}, post-pharynx {Blank single:19197::"unremarkable","non erythematous","erythematous","markedly erythematous","moderately erythematous","mildly erythematous"} {Blank single:19197::"with cobblestoning in the posterior oropharynx","without cobblestoning in the posterior oropharynx"}. Tonsils {Blank single:19197::"surgically absent","unremarklable","3+","4+","touching","2+"} {Blank single:19197::"with exudates","without exudates"} Neck: Supple without thyromegaly.  Adenopathy: {Blank multiple:19196::"Shoddy bilateral anterior cervical lymphadenopathy.","No enlarged lymph nodes appreciated in the occipital, axillary, epitrochlear, inguinal, or popliteal regions.","No enlarged lymph nodes appreciated in the anterior cervical, occipital, axillary, epitrochlear, inguinal, or popliteal regions."} Lungs: {Blank single:19197::"Decreased breath sounds with expiratory wheezing bilaterally","Mildly decreased breath  sounds with expiratory  wheezing bilaterally","Decreased breath sounds bilaterally without wheezing, rhonchi or rales","Mildly decreased breath sounds bilaterally without wheezing, rhonchi or rales","Clear to auscultation without wheezing, rhonchi or rales"}. {Blank single:19197::"Increased work of breathing","No increased work of breathing"}. CV: {Blank single:19197::"Physiologic splitting of S1/S2","Normal S1/S2"}, no murmurs. Capillary refill <2 seconds.  Abdomen: Nondistended, nontender. No guarding or rebound tenderness. Bowel sounds {Blank multiple:19196::"absent","faint","present in all fields","hypoactive","hyperactive"}  Skin: {Blank single:19197::"Dry, erythematous, excoriated patches on the ***","Dry, hyperpigmented, thickened patches on the ***","Dry, mildly hyperpigmented, mildly thickened patches on the ***","Scattered erythematous urticarial type lesions primarily located *** , nonvesicular","Warm and dry, without lesions or rashes"}. Extremities:  No clubbing, cyanosis or edema. Neuro:   Grossly intact. No focal deficits appreciated. Responsive to questions.  Diagnostic studies: none***/deferred due to recent antihistamine use***  Spirometry: {Blank single:19197::"results normal (FEV1: ***%, FVC: ***%, FEV1/FVC: ***%)","results abnormal (FEV1: ***%, FVC: ***%, FEV1/FVC: ***%)"}.    {Blank single:19197::"Spirometry consistent with mild obstructive disease","Spirometry consistent with moderate obstructive disease","Spirometry consistent with severe obstructive disease","Spirometry consistent with possible restrictive disease","Spirometry consistent with mixed obstructive and restrictive disease","Spirometry uninterpretable due to technique","Spirometry consistent with normal pattern"}. {Blank single:19197::"Albuterol/Atrovent nebulizer","Xopenex/Atrovent nebulizer","Albuterol nebulizer"} treatment given in clinic with {Blank single:19197::"significant improvement","no  improvement"}.  Allergy Studies: none***  ***Indoor/Outdoor Percutaneous Adult Environmental Panel: positive to {Blank multiple:19196::"bahia grass","Bermuda grass","johnson grass","[place] blue grass","meadow fescue grass","perennial rye grass","sweet vernal grass","timothy grass","cocklebur","burweed marsh elder","short ragweed","giant ragweed","English plantain","lamb's quarters","sheep sorrel","rough pigweed","rough marsh elder","common [employer] beech","Box elder","red cedar","eastern cottonwood","elm","hickory","maple","oak","pecan pollen","pine","Eastern sycamore","black walnut pollen","Alternaria","Cladosporium","Aspergillus","Penicillium","Bipolaris","Drechslera","Mucor","Fusarium","Aureobasidium","Rhizopus","Botrytis","epicoccum","Phoma","Candida","Tricophyton","Df mite","Dp mites","cat","dog","mixed feather","horse","cockroach","mouse","tobacco"}. Otherwise negative with adequate controls.  ***Indoor/Outdoor Percutaneous Pediatric Environmental Panel: positive to {Blank multiple:19196::"Bermuda grass","[place] blue grass","perennial rye grass","timothy grass","short ragweed","giant ragweed","Birch","Hickory","Oak","Alternaria","Cladosporium","Aspergillus","Penicillium","Bipolaris","Drechslera","Mucor","Fusarium","Aureobasidium","Rhizopus","Epicoccum","Phoma","D farinae dust mite","Cat","Dog","D pteronyssinus dust mite","mixed feather","cockroach","Candida"}. Otherwise negative with adequate controls.  ***Indoor/Outdoor Selected Intradermal Environmental Panel: positive to {Blank multiple:19196::"Bermuda grass","Johnson grass","Grass mix","ragweed mix","weed mix","tree mix","mold mix #1","mold mix #2","mold mix #3","mold mix #4","cat","dog","cockroach","mite mix"}. Otherwise negative with adequate controls.  ***Most Common Foods Panel (peanut, cashew, soy, fish mix, shellfish mix, wheat, milk, egg):  ***Full Food Panel: positive to {Blank multiple:19196::"Peanut (*** x  ***)","Soy (*** x ***)","Wheat (*** x ***)","Corn (*** x ***)","Milk (*** x ***)","Egg (*** x ***)","Casein (*** x ***)","Shellfish Mix (*** x ***)","Fish Mix (*** x ***)","Cashew (*** x ***)","Tomato (*** x ***)","Orange (*** x ***)","Rice (*** x ***)","Pork (*** x ***)","[place] (*** x ***)","Beef (*** x ***)","Lamb (*** x ***)","Chicken (*** x ***)","White Potato (*** x ***)","Banana (*** x ***)","Apple (*** x ***)","Peach (*** x ***)","Sweet Potato (*** x ***)","Green Pea (*** x ***)","Strawberry (*** x ***)","Onion (*** x ***)","Cabbage (*** x ***)","Carrots (*** x ***)","Celery (*** x ***)","Karaya Gum (*** x ***)","Acacia (Arabic Gum) (*** x ***)","Cottonseed (*** x ***)","Flounder (*** x ***)","Trout (*** x ***)","Shrimp (*** x ***)","Crab (*** x ***)","Tuna (*** x ***)","Cantaloupe (*** x ***)","Watermelon (*** x ***)","Pecan (*** x ***)","Walnut (*** x ***)","Chocolate (*** x ***)","Grape (*** x ***)","Cucumber (*** x ***)","Saccharomycese Cerevisiae (*** x ***)","Oyster (*** x ***)","Lobster (*** x ***)","Scallop (*** x ***)","Salmon (*** x ***)","Almond (*** x ***)","Hazelnut (*** x ***)","[place] nut (*** x ***)","Coconut (*** x ***)","Cinnamon (*** x ***)","Nutmeg (*** x ***)","Ginger (*** x ***)","Garlic (*** x ***)","Black Pepper (*** x ***)","Mustard (*** x ***)","Barley (*** x ***)","Oat (*** x ***)","Rye (*** x ***)","Sesame (*** x ***)","Pineapple (*** x ***)"} with adequate controls. Negative to {Blank multiple:19196::"Peanut","Soy","Wheat","Corn","Milk","Egg","Casein","Shellfish Mix","Fish Mix","Cashew","Tomato","Orange","Rice","Pork","Turkey","Beef","Lamb","Chicken","White Potato","Banana","Apple","Peach","Sweet Potato","Green Pea","Strawberry","Onion","Cabbage","Carrots","Celery","Karaya Gum","Acacia (Arabic Gum)","Cottonseed","Flounder","Trout","Shrimp","Crab","Tuna","Cantaloupe","Watermelon","Pecan","Walnut","Chocolate","Grape","Cucumber","Saccharomycese  Cerevisiae","Oyster","Lobster","Scallop","Salmon","Almond","Hazelnut","Brazil nut","Coconut","Cinnamon","Nutmeg","Ginger","Garlic","Black Pepper","Mustard","Barley","Oat","Rye","Sesame","Pineapple"}   ***Selected Foods Panel:     Salvatore Marvel, MD [facility]

## 2017-03-12 NOTE — Progress Notes (Signed)
Patient took antihistamines, therefore we could not do the testing. We decided to reschedule her.  Salvatore Marvel, MD [facility]

## 2017-03-28 ENCOUNTER — Telehealth: Payer: Self-pay | Admitting: Obstetrics and Gynecology

## 2017-03-28 NOTE — Telephone Encounter (Signed)
Please let the patient know that I reviewed her DEXA (patient of Ms Clydene Pugh) and that she has stable osteopenia. Her 10 year risk of any fracture is 4.3% and hip fracture is 0.6%. Treatment is not recommended at this time. She should be getting 1,200 mg a day of calcium in her diet and supplements with 800 IU of vit D 3. She should exercise regularly and have a f/u DEXA in 2 years.

## 2017-03-29 NOTE — Telephone Encounter (Signed)
Spoke with patient and gave results and recommendations. Patient voiced understanding -eh

## 2017-04-26 ENCOUNTER — Ambulatory Visit (INDEPENDENT_AMBULATORY_CARE_PROVIDER_SITE_OTHER): Payer: Medicare Other | Admitting: Allergy & Immunology

## 2017-04-26 ENCOUNTER — Encounter: Payer: Self-pay | Admitting: Allergy & Immunology

## 2017-04-26 VITALS — BP 134/80 | HR 75 | Temp 98.0°F | Resp 17 | Ht 62.0 in | Wt 222.4 lb

## 2017-04-26 DIAGNOSIS — J4540 Moderate persistent asthma, uncomplicated: Secondary | ICD-10-CM | POA: Diagnosis not present

## 2017-04-26 DIAGNOSIS — J302 Other seasonal allergic rhinitis: Secondary | ICD-10-CM | POA: Insufficient documentation

## 2017-04-26 DIAGNOSIS — J3089 Other allergic rhinitis: Secondary | ICD-10-CM

## 2017-04-26 NOTE — Patient Instructions (Addendum)
1. Moderate persistent asthma, uncomplicated - Lung testing looked abnormal, but it did respond to albuterol nebulizer treatment.  - Because of this, I would recommend starting Symbicort 160/4.5 two puffs twice daily. - Daily controller medication(s): Symbicort 160/4.5 two puffs twice daily with spacer - Rescue medications: ProAir 4 puffs every 4-6 hours as needed - Asthma control goals:  * Full participation in all desired activities (may need albuterol before activity) * Albuterol use two time or less a week on average (not counting use with activity) * Cough interfering with sleep two time or less a month * Oral steroids no more than once a year * No hospitalizations  2. Seasonal and perennial allergic rhinitis - Testing today showed: trees, weeds, grasses, molds, dust mites, cat and cockroach - Avoidance measures provided. - Continue with Zyrtec (cetirizine) 10mg  tablet once daily - Start Flonase (fluticasone) one spray per nostril on Mon/Wed/Fri - You can use an extra dose of the antihistamine, if needed, for breakthrough symptoms.  - Consider nasal saline rinses 1-2 times daily to remove allergens from the nasal cavities as well as help with mucous clearance (this is especially helpful to do before the nasal sprays are given) - Consider allergy shots as a means of long-term control. - Allergy shots "re-train" and "reset" the immune system to ignore environmental allergens and decrease the resulting immune response to those allergens (sneezing, itchy watery eyes, runny nose, nasal congestion, etc).    - Allergy shots improve symptoms in 75-85% of patients.  - We can discuss more at the next appointment if the medications are not working for you.  3. Return in about 3 months (around 07/26/2017).   Please inform us of any Emergency Department visits, hospitalizations, or changes in symptoms. Call us before going to the ED for breathing or allergy symptoms since we might be able to fit  you in for a sick visit. Feel free to contact us anytime with any questions, problems, or concerns.  It was a pleasure to meet you today! Enjoy the upcoming fall season!  Websites that have reliable patient information: 1. American Academy of Asthma, Allergy, and Immunology: www.aaaai.org 2. Food Allergy Research and Education (FARE): foodallergy.org 3. Mothers of Asthmatics: http://www.asthmacommunitynetwork.org 4. American College of Allergy, Asthma, and Immunology: www.acaai.org   Election Day is coming up on Tuesday, November 6th! Make your voice heard! Register to vote at [email or web address]!     Reducing Pollen Exposure  The American Academy of Allergy, Asthma and Immunology suggests the following steps to reduce your exposure to pollen during allergy seasons.    1. Do not hang sheets or clothing out to dry; pollen may collect on these items. 2. Do not mow lawns or spend time around freshly cut grass; mowing stirs up pollen. 3. Keep windows closed at night.  Keep car windows closed while driving. 4. Minimize morning activities outdoors, a time when pollen counts are usually at their highest. 5. Stay indoors as much as possible when pollen counts or humidity is high and on windy days when pollen tends to remain in the air longer. 6. Use air conditioning when possible.  Many air conditioners have filters that trap the pollen spores. 7. Use a HEPA room air filter to remove pollen form the indoor air you breathe.  Control of Mold Allergen  Mold and fungi can grow on a variety of surfaces provided certain temperature and moisture conditions exist.  Outdoor molds grow on plants, decaying vegetation and soil.  The major outdoor mold,  Alternaria and Cladosporium, are found in very high numbers during hot and dry conditions.  Generally, a late Summer - Fall peak is seen for common outdoor fungal spores.  Rain will temporarily lower outdoor mold spore count, but counts rise rapidly when the rainy period  ends.  The most important indoor molds are Aspergillus and Penicillium.  Dark, humid and poorly ventilated basements are ideal sites for mold growth.  The next most common sites of mold growth are the bathroom and the kitchen.  Outdoor [employer] 1. Use air conditioning and keep windows closed 2. Avoid exposure to decaying vegetation. 3. Avoid leaf raking. 4. Avoid grain handling. 5. Consider wearing a face mask if working in moldy areas.  Indoor Mold Control 1. Maintain humidity below 50%. 2. Clean washable surfaces with 5% bleach solution. 3. Remove sources e.g. contaminated carpets.  Control of House Dust Mite Allergen    House dust mites play a major role in allergic asthma and rhinitis.  They occur in environments with high humidity wherever human skin, the food for dust mites is found. High levels have been detected in dust obtained from mattresses, pillows, carpets, upholstered furniture, bed covers, clothes and soft toys.  The principal allergen of the house dust mite is found in its feces.  A gram of dust may contain 1,000 mites and 250,000 fecal particles.  Mite antigen is easily measured in the air during house cleaning activities.    1. Encase mattresses, including the box spring, and pillow, in an air tight cover.  Seal the zipper end of the encased mattresses with wide adhesive tape. 2. Wash the bedding in water of 130 degrees Farenheit weekly.  Avoid cotton comforters/quilts and flannel bedding: the most ideal bed covering is the dacron comforter. 3. Remove all upholstered furniture from the bedroom. 4. Remove carpets, carpet padding, rugs, and non-washable window drapes from the bedroom.  Wash drapes weekly or use plastic window coverings. 5. Remove all non-washable stuffed toys from the bedroom.  Wash stuffed toys weekly. 6. Have the room cleaned frequently with a vacuum cleaner and a damp dust-mop.  The patient should not be in a room which is being cleaned and should  wait 1 hour after cleaning before going into the room. 7. Close and seal all heating outlets in the bedroom.  Otherwise, the room will become filled with dust-laden air.  An electric heater can be used to heat the room. 8. Reduce indoor humidity to less than 50%.  Do not use a humidifier.  Control of Cockroach Allergen  Cockroach allergen has been identified as an important cause of acute attacks of asthma, especially in urban settings.  There are fifty-five species of cockroach that exist in the [place], however only three, the [place], [place] species produce allergen that can affect patients with Asthma.  Allergens can be obtained from fecal particles, egg casings and secretions from cockroaches.    1. Remove food sources. 2. Reduce access to water. 3. Seal access and entry points. 4. Spray runways with 0.5-1% Diazinon or Chlorpyrifos 5. Blow boric acid power under stoves and refrigerator. 6. Place bait stations (hydramethylnon) at feeding sites.  Control of Dog or Cat Allergen  Avoidance is the best way to manage a dog or cat allergy. If you have a dog or cat and are allergic to dog or cats, consider removing the dog or cat from the home. If you have a dog or cat but don't want to find it a  new home, or if your family wants a pet even though someone in the household is allergic, here are some strategies that may help keep symptoms at bay:  1. Keep the pet out of your bedroom and restrict it to only a few rooms. Be advised that keeping the dog or cat in only one room will not limit the allergens to that room. 2. Don't pet, hug or kiss the dog or cat; if you do, wash your hands with soap and water. 3. High-efficiency particulate air (HEPA) cleaners run continuously in a bedroom or living room can reduce allergen levels over time. 4. Regular use of a high-efficiency vacuum cleaner or a central vacuum can reduce allergen levels. 5. Giving your dog or cat a bath at least  once a week can reduce airborne allergen.

## 2017-04-26 NOTE — Progress Notes (Signed)
NEW PATIENT  Date of Service/Encounter:  04/26/17  Referring provider: Maury Dus, MD   Assessment:   Moderate persistent asthma, uncomplicated  Seasonal and perennial allergic rhinitis (trees, weeds, grasses, molds, dust mites, cat and cockroach)  Previous smoker   Asthma Reportables:  Severity: moderate persistent  Risk: low Control: not well controlled   Plan/Recommendations:   1. Moderate persistent asthma, uncomplicated - Lung testing looked abnormal, but it did respond to albuterol nebulizer treatment.  - Because of this, I would recommend starting Symbicort 160/4.5 two puffs twice daily. - Daily controller medication(s): Symbicort 160/4.5 two puffs twice daily with spacer - Rescue medications: ProAir 4 puffs every 4-6 hours as needed - Asthma control goals:  * Full participation in all desired activities (may need albuterol before activity) * Albuterol use two time or less a week on average (not counting use with activity) * Cough interfering with sleep two time or less a month * Oral steroids no more than once a year * No hospitalizations  2. Seasonal and perennial allergic rhinitis - Testing today showed: trees, weeds, grasses, molds, dust mites, cat and cockroach - Avoidance measures provided. - Continue with Zyrtec (cetirizine) 10mg  tablet once daily - Start Flonase (fluticasone) one spray per nostril on Mon/Wed/Fri - You can use an extra dose of the antihistamine, if needed, for breakthrough symptoms.  - Consider nasal saline rinses 1-2 times daily to remove allergens from the nasal cavities as well as help with mucous clearance (this is especially helpful to do before the nasal sprays are given) - Consider allergy shots as a means of long-term control. - Allergy shots "re-train" and "reset" the immune system to ignore environmental allergens and decrease the resulting immune response to those allergens (sneezing, itchy watery eyes, runny nose, nasal  congestion, etc).    - Allergy shots improve symptoms in 75-85% of patients.  - We can discuss more at the next appointment if the medications are not working for you.  3. Return in about 3 months (around 07/26/2017).   Subjective:   TERESA NICODEMUS is a 71 y.o. female presenting today for evaluation of  Chief Complaint  Patient presents with  . Allergic Rhinitis   . Sinus Problem    JOYANNA KLEMAN has a history of the following: Patient Active Problem List   Diagnosis Date Noted  . Allergic rhinitis 11/28/2015  . Cough 11/28/2015  . Breast pain 08/25/2012  . Left breast mass 10/29/2011  . Symptomatic cholecystitis 02/24/2011  . HYPOTHYROIDISM 10/02/2010  . HYPERTENSION 10/02/2010  . G E R D 10/02/2010  . OSTEOARTHRITIS 10/02/2010    History obtained from: chart review and patient.  [name] was referred by Maury Dus, MD.     Bilan is a 71 y.o. female presenting for evaluation of environmental allergies. Patient reports that 18 months ago, she had a dental procedure and she was given "too much medicine" and she developed wheezing and sinus problems. She reports that she "could not move" and she "was going to burst". She never had her BP taken, but she describes back pain and chest tightness She could whisper, but not talk. She has tolerated lidocaine in the past. Symptoms improved over the course of 24 hours, but she would continue to have problems after that. She has only had routine cleanings since that time.   She was given ProAir after the dental procedure by her PCP. She is followed by Dr. Tera Partridge, last visit in June 2017. She  was never on a daily medication for her asthma. She did get steroids for her breathing when she was diagnosed with bronchitis; this was after the lidocaine episode as well. She does endorse night time coughing, estimates every night. She does report postnasal drip. She does have GERD and has been on medications for ten years.   Ms.  Greeno reports that she has problems with blockage of one side of her nose on a nearly daily basis. Symptoms did not get better during the winter. She does endorse a deviated septum and some sneezing, but it worsened after the lidocaine episode. She does use Nasacort, only as needed. She does not want to use anything on a daily basis. She has never had scratch testing performed. She does take cetirizine every day.   She does have a history of eczema, which she treats with betamethasone. She has not seen a dermatologist in quite some time.   Otherwise, there is no history of other atopic diseases, including drug allergies, stinging insect allergies, or urticaria. There is no significant infectious history. Vaccinations are up to date.    Past Medical History: Patient Active Problem List   Diagnosis Date Noted  . Allergic rhinitis 11/28/2015  . Cough 11/28/2015  . Breast pain 08/25/2012  . Left breast mass 10/29/2011  . Symptomatic cholecystitis 02/24/2011  . HYPOTHYROIDISM 10/02/2010  . HYPERTENSION 10/02/2010  . G E R D 10/02/2010  . OSTEOARTHRITIS 10/02/2010    Medication List:  Allergies as of 04/26/2017      Reactions   Adhesive [tape]    Hydrogen Peroxide    Latex    Lidocaine    Elevated BP, was given during dental procedure   Metrogel [metronidazole] Other (See Comments)   Increase in vaginal burning.  Does better with Flagyl   Neomycin-bacitracin Zn-polymyx    Statins    They "cripple me"   Sulfonamide Derivatives       Medication List       Accurate as of 04/26/17  4:49 PM. Always use your most recent med list.          Albuterol Sulfate 108 (90 Base) MCG/ACT Aepb Commonly known as:  PROAIR RESPICLICK Inhale 2 puffs into the lungs every 4 (four) hours as needed.   CALCIUM PO Take 1 tablet by mouth daily. Reported on 01/06/2016   cetirizine 10 MG tablet Commonly known as:  ZYRTEC Take 10 mg by mouth daily.   fluticasone 50 MCG/ACT nasal spray Commonly  known as:  FLONASE Place 1 spray into both nostrils 2 (two) times daily.   irbesartan 300 MG tablet Commonly known as:  AVAPRO Take 300 mg by mouth daily.   levothyroxine 112 MCG tablet Commonly known as:  SYNTHROID, LEVOTHROID Take 1 tablet by mouth daily.   multivitamins ther. w/minerals Tabs tablet Take 1 tablet by mouth daily.   omeprazole 40 MG capsule Commonly known as:  PRILOSEC Take 1 capsule by mouth daily.   PROBIOTIC FORMULA Caps Take by mouth daily.   ranitidine 300 MG tablet Commonly known as:  ZANTAC   VITAMIN D PO Take 50,000 Units by mouth once a week.       Birth History: non-contributory.   Developmental History: non-contributory.   Past Surgical History: Past Surgical History:  Procedure Laterality Date  . ABDOMINAL HYSTERECTOMY  1986   secondary to fibroids  . BREAST SURGERY  07/09/2008   mass removal  . CHOLECYSTECTOMY  03/17/11  . SKIN TAG REMOVAL  brow and lid  . THIGH / KNEE SOFT TISSUE BIOPSY  09/05/2009  . TUBAL LIGATION  1980     Family History: Family History  Problem Relation Age of Onset  . Hypertension Mother   . Cancer Mother        stomach  . Hypertension Father   . Prostate cancer Father   . Emphysema Father   . Hypertension Sister   . Hypertension Brother   . Alzheimer's disease Brother   . Hypertension Sister   . Heart failure Brother   . Hypertension Brother   . Breast cancer Daughter 57  . Allergies Sister   . Allergies Brother      Social History: Brandis lives at home with her daughter, who is 32 years old. There are no pets at home. She does not currently smoke, but she did smoke for 40ish years, quitting around 2010. 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    Review of Systems: a 14-point review of systems is pertinent for what is mentioned in HPI.  Otherwise, all other systems were negative. Constitutional: negative other than that listed in the HPI Eyes: negative other than that  listed in the HPI Ears, nose, mouth, throat, and face: negative other than that listed in the HPI Respiratory: negative other than that listed in the HPI Cardiovascular: negative other than that listed in the HPI Gastrointestinal: negative other than that listed in the HPI Genitourinary: negative other than that listed in the HPI Integument: negative other than that listed in the HPI Hematologic: negative other than that listed in the HPI Musculoskeletal: negative other than that listed in the HPI Neurological: negative other than that listed in the HPI Allergy/Immunologic: negative other than that listed in the HPI    Objective:   Blood pressure 134/80, pulse 75, temperature 98 F (36.7 C), resp. rate 17, height 5\' 2"  (1.575 m), weight 222 lb 6.4 oz (100.9 kg), last menstrual period 08/03/1984, SpO2 95 %. Body mass index is 40.68 kg/m.   Physical Exam:  General: Alert, interactive, in no acute distress. Pleasant obese female.  Eyes: No conjunctival injection present on the right, No conjunctival injection present on the left, PERRL bilaterally, No discharge on the right, No discharge on the left and No Horner-Trantas dots present Ears: Right TM pearly gray with normal light reflex, Left TM pearly gray with normal light reflex, Right TM intact without perforation and Left TM intact without perforation.  Nose/Throat: External nose within normal limits and septum midline, turbinates edematous and pale with clear discharge, post-pharynx erythematous with cobblestoning in the posterior oropharynx. Tonsils 2+ without exudates Neck: Supple without thyromegaly.  Adenopathy: shoddy bilateral anterior cervical lymphadenopathy. and no enlarged lymph nodes appreciated in the occipital, axillary, epitrochlear, inguinal, or popliteal regions. Lungs: Decreased breath sounds bilaterally without wheezing, rhonchi or rales. No increased work of breathing. CV: Normal S1/S2, no murmurs. Capillary refill  <2 seconds.  Abdomen: Nondistended, nontender. No guarding or rebound tenderness. Bowel sounds present in all fields and hypoactive  Skin: Warm and dry, without lesions or rashes. Slightly thickened skin on the bilateral extensor surfaces of the hands. Extremities:  No clubbing, cyanosis or edema. Neuro:   Grossly intact. No focal deficits appreciated. Responsive to questions.  Diagnostic studies:   Spirometry: results abnormal (FEV1: 1.12/64%, FVC: 1.98/89%, FEV1/FVC: 56%).    Spirometry consistent with moderate obstructive disease. Albuterol nebulizer treatment given in clinic with improvement, but not significant per ATS criteria. Her FEV1  increased 159mL (15%).   Allergy Studies:   Indoor/Outdoor Percutaneous Adult Environmental Panel: positive to Df mite and Dp mites. Otherwise negative with adequate controls.  Indoor/Outdoor Selected Intradermal Environmental Panel: positive to [place] grass, Grass mix, tree mix, mold mix #2, mold mix #3, mold mix #4, cat and cockroach. Otherwise negative with adequate controls.      Salvatore Marvel, MD [facility]

## 2017-04-26 NOTE — Addendum Note (Signed)
Addended by: Herbie Drape on: 04/26/2017 04:56 PM   Modules accepted: Orders

## 2017-04-27 ENCOUNTER — Encounter: Payer: Self-pay | Admitting: Obstetrics and Gynecology

## 2017-04-27 MED ORDER — BUDESONIDE-FORMOTEROL FUMARATE 160-4.5 MCG/ACT IN AERO: 2.0000 | INHALATION_SPRAY | Freq: Two times a day (BID) | RESPIRATORY_TRACT | 5 refills | Status: DC

## 2017-04-27 MED ORDER — FLUTICASONE PROPIONATE 50 MCG/ACT NA SUSP: 1.0000 | Freq: Two times a day (BID) | NASAL | 3 refills | Status: DC

## 2017-04-27 NOTE — Addendum Note (Signed)
Addended by: Valentina Shaggy on: 04/27/2017 06:15 AM   Modules accepted: Orders

## 2017-05-04 ENCOUNTER — Telehealth: Payer: Self-pay | Admitting: Allergy & Immunology

## 2017-05-04 NOTE — Telephone Encounter (Signed)
Patient states she was coughing during use and having nasal congestion. Patient states she is still coughing and having congestion after stopping use would like something else. Dr Ernst Bowler please advise

## 2017-05-04 NOTE — Telephone Encounter (Signed)
Patient called and said Dr. Ernst Bowler prescribed her Symbicort. She says it is too strong. She quit taking it and now coughs all the time.

## 2017-05-06 NOTE — Telephone Encounter (Signed)
Patient called back and said she has not heard back about her medication that is too strong. She would like to talk to someone.

## 2017-05-06 NOTE — Telephone Encounter (Signed)
Please advise 

## 2017-05-06 NOTE — Telephone Encounter (Signed)
Reviewed note. Let's change to Breo 200/25 one puff once daily to see how this goes. She can come by for a sample while we are waiting for the PA to go through.   Salvatore Marvel, MD [facility]

## 2017-05-07 MED ORDER — FLUTICASONE FUROATE-VILANTEROL 200-25 MCG/INH IN AEPB: 1.0000 | INHALATION_SPRAY | Freq: Every day | RESPIRATORY_TRACT | 5 refills | Status: DC

## 2017-05-07 NOTE — Telephone Encounter (Signed)
New rx has been sent. PA is being submitted via [email or web address]

## 2017-05-07 NOTE — Telephone Encounter (Signed)
Patient has been advised of the medication change. No PA is required for this medication.

## 2017-05-11 NOTE — Telephone Encounter (Signed)
Noted. Thank you for helping with all of this!   Salvatore Marvel, MD [facility] [place]

## 2017-08-16 ENCOUNTER — Ambulatory Visit: Payer: Medicare Other | Admitting: Allergy & Immunology

## 2017-08-26 ENCOUNTER — Encounter: Payer: Self-pay | Admitting: Allergy & Immunology

## 2017-08-26 ENCOUNTER — Ambulatory Visit: Payer: Medicare Other | Admitting: Allergy & Immunology

## 2017-08-26 VITALS — BP 132/83 | HR 72 | Resp 18

## 2017-08-26 DIAGNOSIS — J4540 Moderate persistent asthma, uncomplicated: Secondary | ICD-10-CM | POA: Diagnosis not present

## 2017-08-26 DIAGNOSIS — J302 Other seasonal allergic rhinitis: Secondary | ICD-10-CM | POA: Diagnosis not present

## 2017-08-26 DIAGNOSIS — J3089 Other allergic rhinitis: Secondary | ICD-10-CM | POA: Diagnosis not present

## 2017-08-26 MED ORDER — BUDESONIDE-FORMOTEROL FUMARATE 160-4.5 MCG/ACT IN AERO
2.0000 | INHALATION_SPRAY | Freq: Every day | RESPIRATORY_TRACT | 5 refills | Status: DC
Start: 2017-08-26 — End: 2017-12-20

## 2017-08-26 MED ORDER — METHYLPREDNISOLONE ACETATE 40 MG/ML IJ SUSP
40.0000 mg | Freq: Once | INTRAMUSCULAR | Status: AC
  Administered 2017-08-26: 40 mg via INTRAMUSCULAR

## 2017-08-26 NOTE — Progress Notes (Signed)
FOLLOW UP  Date of Service/Encounter:  08/26/17   Assessment:   Moderate persistent asthma without complication  Seasonal and perennial allergic rhinitis (trees, weeds, grasses, molds, dust mites, cat and cockroach)  Plan/Recommendations:   1. Moderate persistent asthma - not a straightforward diagnosis but she did have a positive methacholine challenge in the peripheral airways and she had reversibility at her new patient visit in September 2018 in her FEV1 - Lung testing looked fairly good today, but I think you would benefit from the use of the Symbicort. - We will decrease to two puffs on the Symbicort at night instead of twice daily in hopes that she will be compliant with this.  - Daily controller medication(s): Symbicort 160/4.5 two puffs once daily with spacer - Rescue medications: ProAir 4 puffs every 4-6 hours as needed - Asthma control goals:  * Full participation in all desired activities (may need albuterol before activity) * Albuterol use two time or less a week on average (not counting use with activity) * Cough interfering with sleep two time or less a month * Oral steroids no more than once a year * No hospitalizations - We also administered one DepoMedrol 40mg  today to help with the healing of her bronchitis.   2. Seasonal and perennial allergic rhinitis (trees, weeds, grasses, molds, dust mites, cat and cockroach) - Continue with Zyrtec (cetirizine) 10mg  tablet once daily - Start Nasacort one spray per nostril on Mon/Wed/Fri - You can use an extra dose of the antihistamine, if needed, for breakthrough symptoms.  - Consider nasal saline rinses 1-2 times daily to remove allergens from the nasal cavities as well as help with mucous clearance (this is especially helpful to do before the nasal sprays are given). - Consider allergy shots if needed.   3. Return in about 3 months (around 11/24/2017).  Subjective:   Lisa Horton is a 72 y.o. female presenting  today for follow up of  Chief Complaint  Patient presents with  . Asthma    Pro-air. Pt is not using Symbicort or Breo any longer due to making her cough.     Lisa Horton has a history of the following: Patient Active Problem List   Diagnosis Date Noted  . Moderate persistent asthma without complication 38/75/6433  . Seasonal and perennial allergic rhinitis 04/26/2017  . Allergic rhinitis 11/28/2015  . Cough 11/28/2015  . Breast pain 08/25/2012  . Left breast mass 10/29/2011  . Symptomatic cholecystitis 02/24/2011  . HYPOTHYROIDISM 10/02/2010  . HYPERTENSION 10/02/2010  . G E R D 10/02/2010  . OSTEOARTHRITIS 10/02/2010    History obtained from: chart review and patient.  Lisa Horton's Primary Care Provider is Maury Dus, MD.     Lisa Horton is a 72 y.o. female presenting for a follow up visit. She was last seen in September 2018 as a New Patient. At that time, she had testing that was positive to trees, weeds, grasses, molds, dust mites, cat and cockroach. We diagnosed her with moderate persistent asthma and started her on Symbicort 160/4.5 two puffs BID. We also started ProAir four puffs every 4-6 hours as needed. For her allergies, we started her on Zyrtec 10mg  once daily as well as Flonase on M/W/F.   Since the last visit, she has not done well. After the last visit, she did use the Symbicort for a couple of days without improvement. She continued to cough, which she felt was secondary to Symbicort. We changed her Breo without improvement. Currently  she is not using a daily medication at all. She was diagnosed with bronchitis Saturday this past weekend and she continues to "rattle". Apparently she was placed on antibiotics and a prednisone burst. Spirometry is normal today for the most part.   She is on omeprazole in the morning and ranitidine at night. This has been her regimen for some time and she feels that this is controlling her symptoms well. Rhinitis seems controlled  with Zyrtec but she is not using the fluticasone as often as she likely should due to nose bleeds.   While under the care of Dr. Tera Partridge, she did undergo full PFTs, and a methacholine challenge was ordered at the time. The read from the full PFT showed "minimal airflow obstruction by f/v curve and air trapping and a moderate diffusion defect." The findings of the methacholine challenge did show changes only in the peripheral airways, as evidenced by changes in the FEF25-75% (see below).     Otherwise, there have been no changes to her past medical history, surgical history, family history, or social history.    Review of Systems: a 14-point review of systems is pertinent for what is mentioned in HPI.  Otherwise, all other systems were negative. Constitutional: negative other than that listed in the HPI Eyes: negative other than that listed in the HPI Ears, nose, mouth, throat, and face: negative other than that listed in the HPI Respiratory: negative other than that listed in the HPI Cardiovascular: negative other than that listed in the HPI Gastrointestinal: negative other than that listed in the HPI Genitourinary: negative other than that listed in the HPI Integument: negative other than that listed in the HPI Hematologic: negative other than that listed in the HPI Musculoskeletal: negative other than that listed in the HPI Neurological: negative other than that listed in the HPI Allergy/Immunologic: negative other than that listed in the HPI    Objective:   Blood pressure 132/83, pulse 72, resp. rate 18, last menstrual period 08/03/1984, SpO2 98 %. There is no height or weight on file to calculate BMI.   Physical Exam:  General: Alert, interactive, in no acute distress.Pleasant female.  Eyes: No conjunctival injection bilaterally, no discharge on the right, no discharge on the left and no Horner-Trantas dots present. PERRL bilaterally. EOMI without pain. No photophobia.    Ears: Right TM pearly gray with normal light reflex, Left TM pearly gray with normal light reflex, Right TM intact without perforation and Left TM intact without perforation.  Nose/Throat: External nose within normal limits and septum midline. Turbinates edematous with clear discharge. Posterior oropharynx erythematous without cobblestoning in the posterior oropharynx. Tonsils 2+ without exudates.  Tongue without thrush. Adenopathy: no enlarged lymph nodes appreciated in the anterior cervical, occipital, axillary, epitrochlear, inguinal, or popliteal regions. Lungs: Clear to auscultation without wheezing, rhonchi or rales. No increased work of breathing. CV: Normal S1/S2. No murmurs. Capillary refill <2 seconds.  Skin: Warm and dry, without lesions or rashes. Neuro:   Grossly intact. No focal deficits appreciated. Responsive to questions.  Diagnostic studies:   Spirometry: results abnormal (FEV1: 1.23/74%, FVC: 1.43/66%, FEV1/FVC: 86%).    Spirometry consistent with possible restrictive disease.  Allergy Studies: none    Salvatore Marvel, MD [facility] [place]

## 2017-08-26 NOTE — Patient Instructions (Addendum)
1. Moderate persistent asthma, uncomplicated - Lung testing looked fairly good today, but I think you would benefit from the use of the Symbicort. - We will decrease to two puffs on the Symbicort at night instead of twice daily. - Daily controller medication(s): Symbicort 160/4.5 two puffs once daily with spacer - Rescue medications: ProAir 4 puffs every 4-6 hours as needed - Asthma control goals:  * Full participation in all desired activities (may need albuterol before activity) * Albuterol use two time or less a week on average (not counting use with activity) * Cough interfering with sleep two time or less a month * Oral steroids no more than once a year * No hospitalizations  2. Seasonal and perennial allergic rhinitis (trees, weeds, grasses, molds, dust mites, cat and cockroach) - Continue with Zyrtec (cetirizine) 10mg  tablet once daily - Start Nasacort one spray per nostril on Mon/Wed/Fri - You can use an extra dose of the antihistamine, if needed, for breakthrough symptoms.  - Consider nasal saline rinses 1-2 times daily to remove allergens from the nasal cavities as well as help with mucous clearance (this is especially helpful to do before the nasal sprays are given). - Consider allergy shots if needed.   3. Return in about 3 months (around 11/24/2017).   Please inform us of any Emergency Department visits, hospitalizations, or changes in symptoms. Call us before going to the ED for breathing or allergy symptoms since we might be able to fit you in for a sick visit. Feel free to contact us anytime with any questions, problems, or concerns.  It was a pleasure to meet you today! Enjoy the upcoming fall season!  Websites that have reliable patient information: 1. American Academy of Asthma, Allergy, and Immunology: www.aaaai.org 2. Food Allergy Research and Education (FARE): foodallergy.org 3. Mothers of Asthmatics: http://www.asthmacommunitynetwork.org 4. American College of  Allergy, Asthma, and Immunology: www.acaai.org   Election Day is coming up on Tuesday, November 6th! Make your voice heard! Register to vote at [email or web address]!     Reducing Pollen Exposure  The American Academy of Allergy, Asthma and Immunology suggests the following steps to reduce your exposure to pollen during allergy seasons.    1. Do not hang sheets or clothing out to dry; pollen may collect on these items. 2. Do not mow lawns or spend time around freshly cut grass; mowing stirs up pollen. 3. Keep windows closed at night.  Keep car windows closed while driving. 4. Minimize morning activities outdoors, a time when pollen counts are usually at their highest. 5. Stay indoors as much as possible when pollen counts or humidity is high and on windy days when pollen tends to remain in the air longer. 6. Use air conditioning when possible.  Many air conditioners have filters that trap the pollen spores. 7. Use a HEPA room air filter to remove pollen form the indoor air you breathe.  Control of Mold Allergen  Mold and fungi can grow on a variety of surfaces provided certain temperature and moisture conditions exist.  Outdoor molds grow on plants, decaying vegetation and soil.  The major outdoor mold, Alternaria and Cladosporium, are found in very high numbers during hot and dry conditions.  Generally, a late Summer - Fall peak is seen for common outdoor fungal spores.  Rain will temporarily lower outdoor mold spore count, but counts rise rapidly when the rainy period ends.  The most important indoor molds are Aspergillus and Penicillium.  Dark, humid and poorly ventilated basements are ideal  sites for mold growth.  The next most common sites of mold growth are the bathroom and the kitchen.  Outdoor [employer] 1. Use air conditioning and keep windows closed 2. Avoid exposure to decaying vegetation. 3. Avoid leaf raking. 4. Avoid grain handling. 5. Consider wearing a face mask if working in moldy  areas.  Indoor Mold Control 1. Maintain humidity below 50%. 2. Clean washable surfaces with 5% bleach solution. 3. Remove sources e.g. contaminated carpets.  Control of House Dust Mite Allergen    House dust mites play a major role in allergic asthma and rhinitis.  They occur in environments with high humidity wherever human skin, the food for dust mites is found. High levels have been detected in dust obtained from mattresses, pillows, carpets, upholstered furniture, bed covers, clothes and soft toys.  The principal allergen of the house dust mite is found in its feces.  A gram of dust may contain 1,000 mites and 250,000 fecal particles.  Mite antigen is easily measured in the air during house cleaning activities.    1. Encase mattresses, including the box spring, and pillow, in an air tight cover.  Seal the zipper end of the encased mattresses with wide adhesive tape. 2. Wash the bedding in water of 130 degrees Farenheit weekly.  Avoid cotton comforters/quilts and flannel bedding: the most ideal bed covering is the dacron comforter. 3. Remove all upholstered furniture from the bedroom. 4. Remove carpets, carpet padding, rugs, and non-washable window drapes from the bedroom.  Wash drapes weekly or use plastic window coverings. 5. Remove all non-washable stuffed toys from the bedroom.  Wash stuffed toys weekly. 6. Have the room cleaned frequently with a vacuum cleaner and a damp dust-mop.  The patient should not be in a room which is being cleaned and should wait 1 hour after cleaning before going into the room. 7. Close and seal all heating outlets in the bedroom.  Otherwise, the room will become filled with dust-laden air.  An electric heater can be used to heat the room. 8. Reduce indoor humidity to less than 50%.  Do not use a humidifier.  Control of Cockroach Allergen  Cockroach allergen has been identified as an important cause of acute attacks of asthma, especially in urban settings.   There are fifty-five species of cockroach that exist in the [place], however only three, the [place], [place] species produce allergen that can affect patients with Asthma.  Allergens can be obtained from fecal particles, egg casings and secretions from cockroaches.    1. Remove food sources. 2. Reduce access to water. 3. Seal access and entry points. 4. Spray runways with 0.5-1% Diazinon or Chlorpyrifos 5. Blow boric acid power under stoves and refrigerator. 6. Place bait stations (hydramethylnon) at feeding sites.  Control of Dog or Cat Allergen  Avoidance is the best way to manage a dog or cat allergy. If you have a dog or cat and are allergic to dog or cats, consider removing the dog or cat from the home. If you have a dog or cat but don't want to find it a new home, or if your family wants a pet even though someone in the household is allergic, here are some strategies that may help keep symptoms at bay:  1. Keep the pet out of your bedroom and restrict it to only a few rooms. Be advised that keeping the dog or cat in only one room will not limit the allergens to that room. 2.  Don't pet, hug or kiss the dog or cat; if you do, wash your hands with soap and water. 3. High-efficiency particulate air (HEPA) cleaners run continuously in a bedroom or living room can reduce allergen levels over time. 4. Regular use of a high-efficiency vacuum cleaner or a central vacuum can reduce allergen levels. 5. Giving your dog or cat a bath at least once a week can reduce airborne allergen.

## 2017-08-27 ENCOUNTER — Encounter: Payer: Self-pay | Admitting: Allergy & Immunology

## 2017-09-15 ENCOUNTER — Ambulatory Visit: Payer: Medicare Other | Admitting: Nurse Practitioner

## 2017-11-11 ENCOUNTER — Ambulatory Visit: Payer: Medicare Other | Admitting: Allergy & Immunology

## 2017-12-20 ENCOUNTER — Encounter: Payer: Self-pay | Admitting: Obstetrics & Gynecology

## 2017-12-20 ENCOUNTER — Other Ambulatory Visit: Payer: Self-pay

## 2017-12-20 ENCOUNTER — Ambulatory Visit (INDEPENDENT_AMBULATORY_CARE_PROVIDER_SITE_OTHER): Payer: Medicare Other | Admitting: Obstetrics & Gynecology

## 2017-12-20 VITALS — BP 130/86 | HR 88 | Resp 16 | Ht 62.5 in | Wt 219.8 lb

## 2017-12-20 DIAGNOSIS — Z01419 Encounter for gynecological examination (general) (routine) without abnormal findings: Secondary | ICD-10-CM | POA: Diagnosis not present

## 2017-12-20 DIAGNOSIS — N6310 Unspecified lump in the right breast, unspecified quadrant: Secondary | ICD-10-CM | POA: Diagnosis not present

## 2017-12-20 NOTE — Progress Notes (Signed)
72 y.o. [id number] SingleAfrican AmericanF here for annual exam.  Has noted an elongated lump on the right breast.  Saw Dr. Marlou Starks for this in 2014.  She feels like the changes have been more prominent the last two years.  Currently has bronchitis.  Is on predisone taper and azithromax.  Went yesterday to [facility].  Not feeling any better.  Denies vaginal bleeding.    PCP:  Dr. Alyson Ingles.  Last appt was in January.   Patient's last menstrual period was 08/03/1984 (approximate).          Sexually active: No.  The current method of family planning is status post hysterectomy.    Exercising: Yes.    walking Smoker:  no  Health Maintenance: Pap:  09/14/16 Neg   08/31/13 Neg  History of abnormal Pap:  yes MMG:  01/29/17 BIRADS1:Neg  Colonoscopy:  04/2017 Normal. F/u 5 years  BMD:   01/29/17 Osteopenia  TDaP:  07/2013 Pneumonia vaccine(s):  2015 Shingrix:   no Hep C testing: 09/14/16 Neg Screening Labs: PCP   reports that she quit smoking about 9 years ago. Her smoking use included cigarettes. She has a 48.00 pack-year smoking history. She has never used smokeless tobacco. She reports that she drinks alcohol. She reports that she does not use drugs.  Past Medical History:  Diagnosis Date  . Acid reflux   . Allergic rhinitis   . Arthritis   . Breast lump   . Bruises easily   . Chicken pox   . Colon polyp   . Gum disease   . Hypertension   . Incontinence   . Measles   . Night sweats   . Nipple discharge   . Prediabetes 06/2014  . Sinusitis   . Thyroid disease    hypothyroidism    Past Surgical History:  Procedure Laterality Date  . ABDOMINAL HYSTERECTOMY  1986   secondary to fibroids  . BREAST SURGERY  07/09/2008   mass removal  . CHOLECYSTECTOMY  03/17/11  . SKIN TAG REMOVAL     brow and lid  . THIGH / KNEE SOFT TISSUE BIOPSY  09/05/2009  . TUBAL LIGATION  1980    Current Outpatient Medications  Medication Sig Dispense Refill  . Albuterol Sulfate (PROAIR RESPICLICK)  194 (90 Base) MCG/ACT AEPB Inhale 2 puffs into the lungs every 4 (four) hours as needed. 1 each 0  . azithromycin (ZITHROMAX) 250 MG tablet Take 1 tablet by mouth 2 (two) times daily.    Marland Kitchen CALCIUM PO Take 1 tablet by mouth daily. Reported on 01/06/2016    . cetirizine (ZYRTEC) 10 MG tablet Take 10 mg by mouth daily.      . fluticasone (FLONASE) 50 MCG/ACT nasal spray Place 1 spray into both nostrils 2 (two) times daily. 16 g 3  . fluticasone furoate-vilanterol (BREO ELLIPTA) 200-25 MCG/INH AEPB Inhale 1 puff into the lungs daily. 30 each 5  . irbesartan (AVAPRO) 300 MG tablet Take 300 mg by mouth daily.     Marland Kitchen levothyroxine (SYNTHROID, LEVOTHROID) 112 MCG tablet Take 1 tablet by mouth daily.    . Multiple Vitamins-Minerals (MULTIVITAMINS THER. W/MINERALS) TABS Take 1 tablet by mouth daily.    Marland Kitchen omeprazole (PRILOSEC) 40 MG capsule Take 1 capsule by mouth daily.    . predniSONE (DELTASONE) 20 MG tablet as directed.    . Probiotic Product (PROBIOTIC FORMULA) CAPS Take by mouth daily.     . ranitidine (ZANTAC) 300 MG tablet     .  Vitamin D, Ergocalciferol, (DRISDOL) 50000 units CAPS capsule Take 1 capsule by mouth once a week.  3   No current facility-administered medications for this visit.     Family History  Problem Relation Age of Onset  . Hypertension Mother   . Cancer Mother        stomach  . Hypertension Father   . Prostate cancer Father   . Emphysema Father   . Hypertension Sister   . Hypertension Brother   . Alzheimer's disease Brother   . Hypertension Sister   . Heart failure Brother   . Hypertension Brother   . Breast cancer Daughter 52  . Allergies Sister   . Allergies Brother     Review of Systems  All other systems reviewed and are negative.   Exam:   BP 130/86 (BP Location: Left Arm, Patient Position: Sitting, Cuff Size: Large)   Pulse 88   Resp 16   Ht 5' 2.5" (1.588 m)   Wt 219 lb 12.8 oz (99.7 kg)   LMP 08/03/1984 (Approximate)   BMI 39.56 kg/m     Height:  5' 2.5" (158.8 cm)  Ht Readings from Last 3 Encounters:  12/20/17 5' 2.5" (1.588 m)  04/26/17 5\' 2"  (1.575 m)  09/14/16 5\' 2"  (1.575 m)    General appearance: alert, cooperative and appears stated age Head: Normocephalic, without obvious abnormality, atraumatic Neck: no adenopathy, supple, symmetrical, trachea midline and thyroid normal to inspection and palpation Lungs: clear to auscultation bilaterally Breasts: right breast nodularity about 4 cm across.  This feels elongated down towards the right breast (pt can identify the area).  No other masses, skin changes LAD.  Left breast without skin changes, LAD, nipple discharge or masses. Heart: regular rate and rhythm Abdomen: soft, non-tender; bowel sounds normal; no masses,  no organomegaly Extremities: extremities normal, atraumatic, no cyanosis or edema Skin: Skin color, texture, turgor normal. No rashes or lesions Lymph nodes: Cervical, supraclavicular, and axillary nodes normal. No abnormal inguinal nodes palpated Neurologic: Grossly normal   Pelvic: External genitalia:  no lesions              Urethra:  normal appearing urethra with no masses, tenderness or lesions              Bartholins and Skenes: normal                 Vagina: normal appearing vagina with normal color and discharge, no lesions              Cervix: absent              Pap taken: No. Bimanual Exam:  Uterus:  uterus absent              Adnexa: no mass, fullness, tenderness               Rectovaginal: Confirms               Anus:  normal sphincter tone, no lesions  Chaperone was present for exam.  A:  Well Woman with normal exam H/o TAH 1986 due to fibroids.  Off HRT 5/11. Hypothyroidism Current issues with bronchitis Osteopenia Hypertension Right breast 4cm nodularity in axilla down towards breast  P:   Mammogram--diagnostic on right will be scheduled pap smear not indicated Lab work done with Dr. Alyson Ingles in January Declines Shingrix vaccination.   Otherwise, vaccines are UTD. return annually or prn

## 2017-12-20 NOTE — Progress Notes (Signed)
Patient scheduled while in office for Dx MMG and [place], if needed, at [facility] on 12/23/17 at 2:30pm. Copy of order given to patient and faxed to [facility]. Patient verbalizes understanding and is agreeable.

## 2018-01-03 ENCOUNTER — Other Ambulatory Visit: Payer: Self-pay | Admitting: Radiology

## 2018-01-07 ENCOUNTER — Encounter: Payer: Self-pay | Admitting: *Deleted

## 2018-01-07 DIAGNOSIS — Z170 Estrogen receptor positive status [ER+]: Principal | ICD-10-CM

## 2018-01-07 DIAGNOSIS — C50411 Malignant neoplasm of upper-outer quadrant of right female breast: Secondary | ICD-10-CM | POA: Insufficient documentation

## 2018-01-11 NOTE — Progress Notes (Signed)
[place]  [phone] Fax:([phone] Note   Patient Care Team: Maury Dus, MD as PCP - General (Family Medicine) [name], MD as Consulting Physician (General Surgery) Truitt Merle, MD as Consulting Physician (Hematology) Gery Pray, MD as Consulting Physician (Radiation Oncology) 01/12/2018  CHIEF COMPLAINTS/PURPOSE OF CONSULTATION:  Cancer Staging Malignant neoplasm of upper-outer quadrant of right breast in female, estrogen receptor positive ([name]) Staging form: Breast, AJCC 8th Edition - Clinical stage from 01/04/2018: Stage IA (cT1c, cN0, cM0, G2, ER+, PR+, HER2-) - Signed by Truitt Merle, MD on 01/11/2018    Malignant neoplasm of upper-outer quadrant of right breast in female, estrogen receptor positive ([name])   12/28/2017 Mammogram    Bilateral diagnostic mammography with tomography and right breast ultrasonography at [facility] on 12/28/2017 showing: The irregular architectural distortion in the right breast upper outer quadrant posterior depth is indeterminate. The architectural distortion in the right breast upper outer quadrant middle depth is indeterminate.       01/03/2018 Pathology Results    Right needle core biopsy with pathology showing: Breast, right, needle core biopsy, (A) 11 o'clock with invasive mammary carcinoma, grade I-II. Breast, right, needle core biopsy, (B) 11 o'clock with invasive mammary carcinoma, grade I-II. Prognostic indicators significant for: ER, 60% positive with moderate staining intensity and PR, 90% positive with strong staining intensity. Proliferation marker Ki67 at 1%. HER2 negative.      01/04/2018 Cancer Staging    Staging form: Breast, AJCC 8th Edition - Clinical stage from 01/04/2018: Stage IA (cT1c, cN0, cM0, G2, ER+, PR+, HER2-) - Signed by Truitt Merle, MD on 01/11/2018      01/07/2018 Initial Diagnosis    Malignant neoplasm of upper-outer quadrant of right breast in female, estrogen receptor  positive ([name])        HISTORY OF PRESENTING ILLNESS:  STORY CONTI 72 y.o. female is here because of newly diagnosed right breast cancer. She is accompanied by a family member today. She initially presented with a gradually enlarging sore lump to right UOQ for 2 years and increasing in size over the past year. She was initially evaluated by Dr. Marlou Starks with a normal mammogram June 2018 initially. She notes that she spoke with Dr. Marlou Starks and she is interested in Dr. Donne Hazel on completing her surgery. She has had a right breast benign biopsy in 2010. She continues with yearly mammograms.   She underwent bilateral diagnostic mammography with tomography and right breast ultrasonography at [facility] on 12/28/2017 showing: The irregular architectural distortion in the right breast upper outer quadrant posterior depth is indeterminate. The architectural distortion in the right breast upper outer quadrant middle depth is indeterminate.   Accordingly on 01/03/2018 she proceeded to right needle core biopsy with pathology showing: Breast, right, needle core biopsy, (A) 11 o'clock with invasive mammary carcinoma, grade I-II. Breast, right, needle core biopsy, (B) 11 o'clock with invasive mammary carcinoma, grade I-II. Prognostic indicators significant for: ER, 60% positive with moderate staining intensity and PR, 90% positive with strong staining intensity. Proliferation marker Ki67 at 1%. HER2 negative.  On review of systems, pt reports right upper back pain s/p surgery. She had nipple discharge in 2010. She denies nipple discharge and any other symptoms.   She notes that her mother had a history of pancreatic cancer. Her daughter had triple negative breast cancer in 2010 at age 54 with BRCA1 and BRCA2 that was negative. Her father had prostate cancer. She smoked for awhile but quit  smoking in 2010, she hasn't had a lung cancer screening. She used to be a Glass blower/designer.   GYN HISTORY  Menarchal: 72 years  old LMP: 1985 Contraceptive: Yes, from [phone] and then 1970-1980 HRT: Yes for about 10 years GP: G2P2   MEDICAL HISTORY:  Past Medical History:  Diagnosis Date  . Acid reflux   . Allergic rhinitis   . Arthritis   . Breast lump   . Bruises easily   . Chicken pox   . Colon polyp   . Gum disease   . Hypertension   . Hypothyroidism   . Incontinence   . Measles   . Night sweats   . Nipple discharge   . Prediabetes 06/2014    SURGICAL HISTORY: Past Surgical History:  Procedure Laterality Date  . ABDOMINAL HYSTERECTOMY  1986   secondary to fibroids  . BREAST SURGERY  07/09/2008   mass removal  . CHOLECYSTECTOMY  03/17/11  . SKIN TAG REMOVAL     brow and lid  . THIGH / KNEE SOFT TISSUE BIOPSY  09/05/2009  . TUBAL LIGATION  1980    SOCIAL HISTORY: Social History   Socioeconomic History  . Marital status: Single    Spouse name: Not on file  . Number of children: Not on file  . Years of education: Not on file  . Highest education level: Not on file  Occupational History  . Not on file  Social Needs  . Financial resource strain: Not on file  . Food insecurity:    Worry: Not on file    Inability: Not on file  . Transportation needs:    Medical: Not on file    Non-medical: Not on file  Tobacco Use  . Smoking status: Former Smoker    Packs/day: 1.00    Years: 48.00    Pack years: 48.00    Types: Cigarettes    Last attempt to quit: 08/03/2008    Years since quitting: 9.4  . Smokeless tobacco: Never Used  Substance and Sexual Activity  . Alcohol use: Yes    Alcohol/week: 0.0 oz    Comment: rare  . Drug use: No  . Sexual activity: Not Currently  Lifestyle  . Physical activity:    Days per week: Not on file    Minutes per session: Not on file  . Stress: Not on file  Relationships  . Social connections:    Talks on phone: Not on file    Gets together: Not on file    Attends religious service: Not on file    Active member of club or organization: Not on  file    Attends meetings of clubs or organizations: Not on file    Relationship status: Not on file  . Intimate partner violence:    Fear of current or ex partner: Not on file    Emotionally abused: Not on file    Physically abused: Not on file    Forced sexual activity: Not on file  Other Topics Concern  . Not on file  Social History Narrative   Single, lives with daughter   2 children   Retired - Glass blower/designer   No recent travel      Financial controller Pulmonary:   Previously worked Arboriculturist cigarettes. From [place] and has always lived in [place]. No international travel. No pets currently. No bird or mold exposure.     FAMILY HISTORY: Family History  Problem Relation Age of Onset  . Hypertension Mother   .  Cancer Mother        pancreatic cancer   . Hypertension Father   . Prostate cancer Father   . Emphysema Father   . Hypertension Sister   . Hypertension Brother   . Alzheimer's disease Brother   . Hypertension Sister   . Heart failure Brother   . Hypertension Brother   . Breast cancer Daughter 87  . Allergies Sister   . Allergies Brother     ALLERGIES:  is allergic to adhesive [tape]; hydrogen peroxide; latex; lidocaine; metrogel [metronidazole]; neomycin-bacitracin zn-polymyx; statins; sulfonamide derivatives; and sulfa antibiotics.  MEDICATIONS:  Current Outpatient Medications  Medication Sig Dispense Refill  . Albuterol Sulfate (PROAIR RESPICLICK) 948 (90 Base) MCG/ACT AEPB Inhale 2 puffs into the lungs every 4 (four) hours as needed. 1 each 0  . CALCIUM PO Take 1 tablet by mouth daily. Reported on 01/06/2016    . cetirizine (ZYRTEC) 10 MG tablet Take 10 mg by mouth daily.      . irbesartan (AVAPRO) 300 MG tablet Take 300 mg by mouth daily.     Marland Kitchen levothyroxine (SYNTHROID, LEVOTHROID) 112 MCG tablet Take 1 tablet by mouth daily.    . Multiple Vitamins-Minerals (MULTIVITAMINS THER. W/MINERALS) TABS Take 1 tablet by mouth daily.    . ranitidine (ZANTAC) 300 MG tablet     . Vitamin  D, Ergocalciferol, (DRISDOL) 50000 units CAPS capsule Take 1 capsule by mouth once a week.  3  . fluticasone (FLONASE) 50 MCG/ACT nasal spray Place 1 spray into both nostrils 2 (two) times daily. (Patient not taking: Reported on 01/12/2018) 16 g 3  . fluticasone furoate-vilanterol (BREO ELLIPTA) 200-25 MCG/INH AEPB Inhale 1 puff into the lungs daily. (Patient not taking: Reported on 01/12/2018) 30 each 5  . omeprazole (PRILOSEC) 40 MG capsule Take 1 capsule by mouth daily.     No current facility-administered medications for this visit.     REVIEW OF SYSTEMS:  Constitutional: Denies fevers, chills or abnormal night sweats Eyes: Denies blurriness of vision, double vision or watery eyes Ears, nose, mouth, throat, and face: Denies mucositis or sore throat Respiratory: Denies cough, dyspnea or wheezes Cardiovascular: Denies palpitation, chest discomfort or lower extremity swelling Gastrointestinal:  Denies nausea, heartburn or change in bowel habits Skin: Denies abnormal skin rashes Lymphatics: Denies new lymphadenopathy or easy bruising Neurological:Denies numbness, tingling or new weaknesses Behavioral/Psych: Mood is stable, no new changes  All other systems were reviewed with the patient and are negative.  PHYSICAL EXAMINATION:  ECOG PERFORMANCE STATUS: 0 - Asymptomatic  Vitals:   01/12/18 1225  BP: 129/78  Pulse: 86  Resp: 18  Temp: 98.6 F (37 C)  SpO2: 99%   Filed Weights   01/12/18 1225  Weight: 220 lb 11.2 oz (100.1 kg)    GENERAL:alert, no distress and comfortable SKIN: skin color, texture, turgor are normal, no rashes or significant lesions EYES: normal, conjunctiva are pink and non-injected, sclera clear OROPHARYNX:no exudate, no erythema and lips, buccal mucosa, and tongue normal  NECK: supple, thyroid normal size, non-tender, without nodularity LYMPH:  no palpable lymphadenopathy in the cervical, axillary or inguinal LUNGS: clear to auscultation and percussion with  normal breathing effort HEART: regular rate & rhythm and no murmurs and no lower extremity edema ABDOMEN:abdomen soft, non-tender and normal bowel sounds Musculoskeletal:no cyanosis of digits and no clubbing  PSYCH: alert & oriented x 3 with fluent speech NEURO: no focal motor/sensory deficits Breasts: 1.5 cm soft tissue in the right upper quadrant only palpable when she  sits, movable, no other masses or adenopathy. No other skin changes.    LABORATORY DATA:  I have reviewed the data as listed CBC Latest Ref Rng & Units 01/12/2018 11/28/2015 03/10/2011  WBC 3.9 - 10.3 K/uL 5.4 8.1 5.8  Hemoglobin 11.6 - 15.9 g/dL 12.8 13.4 13.2  Hematocrit 34.8 - 46.6 % 39.7 40.8 40.1  Platelets 145 - 400 K/uL 171 203.0 154    CMP Latest Ref Rng & Units 01/12/2018 03/10/2011 08/30/2009  Glucose 70 - 140 mg/dL 121 103(H) 97  BUN 7 - 26 mg/dL '15 12 13  ' Creatinine 0.60 - 1.10 mg/dL 1.07 1.02 0.98  Sodium 136 - 145 mmol/L 140 141 136  Potassium 3.5 - 5.1 mmol/L 4.5 4.1 4.1  Chloride 98 - 109 mmol/L 106 104 101  CO2 22 - 29 mmol/L '27 28 29  ' Calcium 8.4 - 10.4 mg/dL 9.3 9.8 9.5  Total Protein 6.4 - 8.3 g/dL 8.0 7.6 7.6  Total Bilirubin 0.2 - 1.2 mg/dL 0.4 0.4 1.0  Alkaline Phos 40 - 150 U/L 58 64 63  AST 5 - 34 U/L 20 29 38(H)  ALT 0 - 55 U/L 21 28 40(H)    PATHOLOGY:  Right needle core biopsy, 01/03/2018 IMPRESSION:  Breast, right, needle core biopsy, (A) 11 o'clock with invasive mammary carcinoma, grade I-II. Breast, right, needle core biopsy, (B) 11 o'clock with invasive mammary carcinoma, grade I-II. Prognostic indicators significant for: ER, 60% positive with moderate staining intensity and PR, 90% positive with strong staining intensity. Proliferation marker Ki67 at 1%. HER2 negative.   RADIOGRAPHIC STUDIES: I have personally reviewed the radiological images as listed and agreed with the findings in the report. No results found.   DIAG MM and Right breast US, 12/28/2017  Bilateral diagnostic  mammography with tomography and right breast ultrasonography at [facility] on 12/28/2017 showing: The irregular architectural distortion in the right breast upper outer quadrant posterior depth is indeterminate. The architectural distortion in the right breast upper outer quadrant middle depth is indeterminate.    ASSESSMENT & PLAN:  Lisa Horton is a 72 y.o. lovely female with a history of thyroid issues  No orders of the defined types were placed in this encounter.  1.  Malignant neoplasm of upper outer quadrant of right breast, invasive lobular carcinoma, Stage IA, cT1cN0M0, G2, ER (+), PR (+), HER2 negative --We discussed her imaging findings and the biopsy results in great details. -Giving the invasive lobular carcinoma disease, she would need surgery intervention. She is agreeable with that. She was seen by Dr. Marlou Starks today and likely will proceed with surgery soon with Dr. Donne Hazel if available due to Dr. Donne Hazel performing surgery on her daughter.  -I discussed with the patient today that we will obtain a MRI prior to her surgery to evaluate the extent of the disease, due to her lobular histology. -We discussed the risk of cancer recurrence, especially distant recurrence, after complete surgical resection.  Given her early stage disease, lobular histology, strong ER PR positive and HER-2 negative disease, this is likely low risk disease -I recommend an Oncotype Dx test on the surgical sample and we'll make a decision about adjuvant chemotherapy based on the Oncotype result. Written material of this test was given to her. She would be a good candidate for chemotherapy if her Oncotype recurrence score is high. -If her surgical sentinel lymph node node positive, I recommend mammaprint for further risk stratification and guide adjuvant chemotherapy. -Giving the strong ER and PR expression in her  postmenopausal status, I recommend adjuvant endocrine therapy with aromatase inhibitor for a total of  5-10 years to reduce the risk of cancer recurrence. Potential benefits and side effects were discussed with patient and she is interested.  Due to her lobular histology, I recommend 7 to 10 years therapy. -She was also seen by radiation oncologist Dr. Sondra Come today. If her surgical sentinel lymph nodes were negative, she would not need radiation.  -We also discussed the breast cancer surveillance after her surgery. She will continue annual screening mammogram, self exam, and a routine office visit with lab and exam with [place]. -I encouraged her to have healthy diet and exercise regularly.    PLAN: -She will proceed with a MRI prior to her lumpectomy with sentinel node biopsy -She will undergo an Oncotype Dx on her surgical sample, or mammaprint if node positive -I will see her after adjuvant radiation, or sooner if Oncotype shows high risk disease.  All questions were answered. The patient knows to call the clinic with any problems, questions or concerns. I spent 40 minutes counseling the patient face to face. The total time spent in the appointment was 45 minutes and more than 50% was on counseling.     Truitt Merle, MD 01/12/2018 5:59 PM   I, Soijett Blue am acting as scribe for Dr. Truitt Merle.  I have reviewed the above documentation for accuracy and completeness, and I agree with the above.

## 2018-01-12 ENCOUNTER — Inpatient Hospital Stay: Payer: Medicare Other | Attending: Hematology | Admitting: Hematology

## 2018-01-12 ENCOUNTER — Encounter: Payer: Self-pay | Admitting: Hematology

## 2018-01-12 ENCOUNTER — Other Ambulatory Visit: Payer: Self-pay | Admitting: *Deleted

## 2018-01-12 ENCOUNTER — Ambulatory Visit: Payer: Medicare Other | Attending: General Surgery | Admitting: Physical Therapy

## 2018-01-12 ENCOUNTER — Encounter: Payer: Self-pay | Admitting: Physical Therapy

## 2018-01-12 ENCOUNTER — Encounter: Payer: Self-pay | Admitting: General Practice

## 2018-01-12 ENCOUNTER — Inpatient Hospital Stay: Payer: Medicare Other

## 2018-01-12 ENCOUNTER — Ambulatory Visit: Payer: Self-pay | Admitting: General Surgery

## 2018-01-12 ENCOUNTER — Other Ambulatory Visit: Payer: Self-pay

## 2018-01-12 ENCOUNTER — Ambulatory Visit
Admission: RE | Admit: 2018-01-12 | Discharge: 2018-01-12 | Disposition: A | Discharge status: Home / Self Care | Payer: Medicare Other | Source: Ambulatory Visit | Attending: Radiation Oncology | Admitting: Radiation Oncology

## 2018-01-12 VITALS — BP 129/78 | HR 86 | Temp 98.6°F | Resp 18 | Ht 62.5 in | Wt 220.7 lb

## 2018-01-12 DIAGNOSIS — Z170 Estrogen receptor positive status [ER+]: Secondary | ICD-10-CM | POA: Insufficient documentation

## 2018-01-12 DIAGNOSIS — C50411 Malignant neoplasm of upper-outer quadrant of right female breast: Secondary | ICD-10-CM

## 2018-01-12 DIAGNOSIS — R293 Abnormal posture: Secondary | ICD-10-CM | POA: Diagnosis present

## 2018-01-12 LAB — CMP (CANCER CENTER ONLY)
ALT: 21 U/L (ref 0–55)
AST: 20 U/L (ref 5–34)
Albumin: 4 g/dL (ref 3.5–5.0)
Alkaline Phosphatase: 58 U/L (ref 40–150)
Anion gap: 7 (ref 3–11)
BUN: 15 mg/dL (ref 7–26)
CALCIUM: 9.3 mg/dL (ref 8.4–10.4)
CHLORIDE: 106 mmol/L (ref 98–109)
CO2: 27 mmol/L (ref 22–29)
CREATININE: 1.07 mg/dL (ref 0.60–1.10)
GFR, EST AFRICAN AMERICAN: 60 mL/min — AB (ref >60)
GFR, EST NON AFRICAN AMERICAN: 52 mL/min — AB (ref >60)
Glucose, Bld: 121 mg/dL (ref 70–140)
Potassium: 4.5 mmol/L (ref 3.5–5.1)
Sodium: 140 mmol/L (ref 136–145)
Total Bilirubin: 0.4 mg/dL (ref 0.2–1.2)
Total Protein: 8 g/dL (ref 6.4–8.3)

## 2018-01-12 LAB — CBC WITH DIFFERENTIAL (CANCER CENTER ONLY)
Basophils Absolute: 0 10*3/uL (ref 0.0–0.1)
Basophils Relative: 1 %
EOS PCT: 6 %
Eosinophils Absolute: 0.3 10*3/uL (ref 0.0–0.5)
HCT: 39.7 % (ref 34.8–46.6)
Hemoglobin: 12.8 g/dL (ref 11.6–15.9)
LYMPHS ABS: 1.6 10*3/uL (ref 0.9–3.3)
Lymphocytes Relative: 30 %
MCH: 29.7 pg (ref 25.1–34.0)
MCHC: 32.2 g/dL (ref 31.5–36.0)
MCV: 92.2 fL (ref 79.5–101.0)
MONO ABS: 0.3 10*3/uL (ref 0.1–0.9)
MONOS PCT: 6 %
Neutro Abs: 3.1 10*3/uL (ref 1.5–6.5)
Neutrophils Relative %: 57 %
PLATELETS: 171 10*3/uL (ref 145–400)
RBC: 4.31 MIL/uL (ref 3.70–5.45)
RDW: 13.7 % (ref 11.2–14.5)
WBC Count: 5.4 10*3/uL (ref 3.9–10.3)

## 2018-01-12 NOTE — Patient Instructions (Signed)

## 2018-01-12 NOTE — Progress Notes (Unsigned)
Lisa Horton

## 2018-01-12 NOTE — Progress Notes (Signed)
Nutrition Assessment  Reason for Assessment:  Pt seen in Breast Clinic  ASSESSMENT:  72 year old female with new diagnosis of breast cancer.  Past medical history reviewed prediabetes and HTN.  Patient reports normal appetite   Medications:  reviewed  Labs: reviewed  Anthropometrics:   Height: 62.5 inches Weight: 220 lb BMI: 39   NUTRITION DIAGNOSIS: Food and nutrition related knowledge deficit related to new diagnosis of breast cancer as evidenced by no prior need for nutrition related information.  INTERVENTION:   Discussed and provided packet of information regarding nutritional tips for breast cancer patients.  Questions answered.  Teachback method used.  Contact information provided and patient knows to contact me with questions/concerns.    MONITORING, EVALUATION, and GOAL: Pt will consume a healthy plant based diet to maintain lean body mass throughout treatment.   Eliodoro Gullett B. Zenia Resides, [place], [place] Registered Dietitian [phone] (pager)

## 2018-01-12 NOTE — Progress Notes (Signed)
Radiation Oncology         [phone] ________________________________  Multidisciplinary Breast Oncology Clinic [facility]) Initial Outpatient Consultation  Name: Lisa Horton MRN: [id number]  Date: 01/12/2018  DOB: 29-Jan-1946  ML:JQGBE, Herbie [place], MD  [name], MD   REFERRING PHYSICIAN: Autumn Messing III, MD  DIAGNOSIS: Stage IA, Right Breast UOQ Invasive lobular Carcinoma, ER(+) / PR(+) / Her2(-), Grade I-II    ICD-10-CM   1. Malignant neoplasm of upper-outer quadrant of right breast in female, estrogen receptor positive ([place]) C50.411    Z17.0     HISTORY OF PRESENT ILLNESS::Lisa Horton is a 72 y.o. female who is presenting to the office today for evaluation of her newly diagnosed breast cancer. She is doing well overall.   She underwent bilateral diagnostic mammography with tomography and right breast ultrasonography at [facility] on 12/28/2017 showing: suspicious of malignancy, 1.8 cm mass in right breast UOQ posterior depth, 1.7 cm irregular mass in right breast UOQ middle depth.   Accordingly on 01/03/2018 she proceeded to biopsy of the right breast area in question. The pathology from this procedure showed: Breast, right, needle core biopsy, (A) 11 o'clock with invasive mammary carcinoma, grade I-II. Breast, right, needle core biopsy, (B) 11 o'clock with invasive mammary carcinoma, grade I-II. Prognostic indicators significant for: ER, 60% positive with moderate staining intensity and PR, 90% positive with strong staining intensity. Proliferation marker Ki67 at 1%. HER2 negative.  On review of systems, She reports muscle ache in her extremities, sinus issues with dry cough, abdominal pain, breast pain, and incontinence. She denies chest pain, headache, and any other symptoms.    Menarche: 72 years old Age at first live birth: 72 years old GP: 2 LMP: 24 Contraceptive: 1968-1970, 1970-1980 HRT: 1999 - 2009   The patient was referred today for presentation in the  multidisciplinary conference.  Radiology studies and pathology slides were presented there for review and discussion of treatment options.  A consensus was discussed regarding potential next steps.  PREVIOUS RADIATION THERAPY: No  PAST MEDICAL HISTORY:  has a past medical history of Acid reflux, Allergic rhinitis, Arthritis, Breast lump, Bruises easily, Chicken pox, Colon polyp, Gum disease, Hypertension, Hypothyroidism, Incontinence, Measles, Night sweats, Nipple discharge, and Prediabetes (06/2014).    PAST SURGICAL HISTORY: Past Surgical History:  Procedure Laterality Date  . ABDOMINAL HYSTERECTOMY  1986   secondary to fibroids  . BREAST SURGERY  07/09/2008   mass removal  . CHOLECYSTECTOMY  03/17/11  . SKIN TAG REMOVAL     brow and lid  . THIGH / KNEE SOFT TISSUE BIOPSY  09/05/2009  . TUBAL LIGATION  1980    FAMILY HISTORY: family history includes Allergies in her brother and sister; Alzheimer's disease in her brother; Breast cancer (age of onset: 37) in her daughter; Cancer in her mother; Emphysema in her father; Heart failure in her brother; Hypertension in her brother, brother, father, mother, sister, and sister; Prostate cancer in her father.  SOCIAL HISTORY:  reports that she quit smoking about 9 years ago. Her smoking use included cigarettes. She has a 48.00 pack-year smoking history. She has never used smokeless tobacco. She reports that she drinks alcohol. She reports that she does not use drugs.  ALLERGIES: Adhesive [tape]; Hydrogen peroxide; Latex; Lidocaine; Metrogel [metronidazole]; Neomycin-bacitracin zn-polymyx; Statins; Sulfonamide derivatives; and Sulfa antibiotics  MEDICATIONS:  Current Outpatient Medications  Medication Sig Dispense Refill  . Albuterol Sulfate (PROAIR RESPICLICK) 010 (90 Base) MCG/ACT AEPB Inhale 2 puffs into the lungs every  4 (four) hours as needed. 1 each 0  . CALCIUM PO Take 1 tablet by mouth daily. Reported on 01/06/2016    . cetirizine (ZYRTEC) 10  MG tablet Take 10 mg by mouth daily.      . fluticasone (FLONASE) 50 MCG/ACT nasal spray Place 1 spray into both nostrils 2 (two) times daily. (Patient not taking: Reported on 01/12/2018) 16 g 3  . fluticasone furoate-vilanterol (BREO ELLIPTA) 200-25 MCG/INH AEPB Inhale 1 puff into the lungs daily. (Patient not taking: Reported on 01/12/2018) 30 each 5  . irbesartan (AVAPRO) 300 MG tablet Take 300 mg by mouth daily.     Marland Kitchen levothyroxine (SYNTHROID, LEVOTHROID) 112 MCG tablet Take 1 tablet by mouth daily.    . Multiple Vitamins-Minerals (MULTIVITAMINS THER. W/MINERALS) TABS Take 1 tablet by mouth daily.    Marland Kitchen omeprazole (PRILOSEC) 40 MG capsule Take 1 capsule by mouth daily.    . ranitidine (ZANTAC) 300 MG tablet     . Vitamin D, Ergocalciferol, (DRISDOL) 50000 units CAPS capsule Take 1 capsule by mouth once a week.  3   No current facility-administered medications for this encounter.     REVIEW OF SYSTEMS:  REVIEW OF SYSTEMS: A 10+ POINT REVIEW OF SYSTEMS WAS OBTAINED including neurology, dermatology, psychiatry, cardiac, respiratory, lymph, extremities, GI, GU, musculoskeletal, constitutional, reproductive, HEENT. All pertinent positives are noted in the HPI. All others are negative.   PHYSICAL EXAM: Vitals with BMI 01/12/2018  Height 5' 2.5"  Weight 220 lbs 11 oz  BMI 48.5  Systolic 462  Diastolic 78  Pulse 86  Respirations 18  Lungs are clear to auscultation bilaterally. Heart has regular rate and rhythm. No palpable cervical, supraclavicular, or axillary adenopathy. Abdomen soft, non-tender, normal bowel sounds. Breast: left breast with no palpable mass, nipple discharge, or bleeding. right breast with small biopsy sites in the UOQ, no signs of drainage or infection of the nipple, no nipple discharge or bleeding, no dominate mass appreciated in the breast..   KPS = 90  100 - Normal; no complaints; no evidence of disease. 90   - Able to carry on normal activity; minor signs or symptoms of  disease. 80   - Normal activity with effort; some signs or symptoms of disease. 41   - Cares for self; unable to carry on normal activity or to do active work. 60   - Requires occasional assistance, but is able to care for most of his personal needs. 50   - Requires considerable assistance and frequent medical care. 70   - Disabled; requires special care and assistance. 52   - Severely disabled; hospital admission is indicated although death not imminent. 54   - Very sick; hospital admission necessary; active supportive treatment necessary. 10   - Moribund; fatal processes progressing rapidly. 0     - Dead  Karnofsky DA, Abelmann [place], Craver LS and Burchenal [facility] [phone]) The use of the nitrogen mustards in the palliative treatment of carcinoma: with particular reference to bronchogenic carcinoma Cancer 1 634-56  LABORATORY DATA:  Lab Results  Component Value Date   WBC 5.4 01/12/2018   HGB 12.8 01/12/2018   HCT 39.7 01/12/2018   MCV 92.2 01/12/2018   PLT 171 01/12/2018   Lab Results  Component Value Date   NA 140 01/12/2018   K 4.5 01/12/2018   CL 106 01/12/2018   CO2 27 01/12/2018   Lab Results  Component Value Date   ALT 21 01/12/2018   AST 20 01/12/2018  ALKPHOS 58 01/12/2018   BILITOT 0.4 01/12/2018    PULMONARY FUNCTION TEST:   Recent Review Flowsheet Data    Spirometry Latest Ref Rng & Units 01/06/2016   FVC-%PRED-PRE % 76   FVC-%PRED-POST % 74   FEV1-PRE L 1.43   FEV1-%PRED-PRE % 82   FEV1-POST L 1.40   FEV1-%PRED-POST % 80   DLCO [facility] ml/min/mmHg 12.73      RADIOGRAPHY: No results found.    IMPRESSION:  Patient will be a good candidate for breast conservation with radiotherapy to right breast. The patient has 2 lesions but they are in close proximity to one another, allowing for breast conserving surgery, unless MRI shows more significant disease. We discussed the general course of radiation, potential side effects, and toxicities with radiation and the patient  is interested in this approach. A consent form was signed and placed in her chart today.   PLAN:Stage IA, Right Breast UOQ Invasive lobular Carcinoma, ER(+) / PR(+) / Her2(-), Grade I-II 1. Right Breast Lumpectomy 2. Radiation Therapy  3. Aromatase Inhibitor 4. Genetics Counseling Scheduled for 01/13/2018 at 3 pm 5. MRI given the lobular histology Scheduled for 01/18/2018 at 11:30 am      ------------------------------------------------  Blair Promise, PhD, MD   This document serves as a record of services personally performed by Gery Pray, MD. It was created on his behalf by Wilburn Mylar, a trained medical scribe. The creation of this record is based on the scribe's personal observations and the provider's statements to them. This document has been checked and approved by the attending provider.

## 2018-01-12 NOTE — Therapy (Addendum)
[place], [place], [address] Phone: [phone]   Fax:  [phone]  Physical Therapy Evaluation  Patient Details  Name: Lisa Horton MRN: [id number] Date of Birth: 29-May-1946 Referring Provider: Dr. Autumn Messing   Encounter Date: 01/12/2018  PT End of Session - 01/12/18 1646    Visit Number  1    Number of Visits  2    Date for PT Re-Evaluation  03/09/18    PT Start Time  2449    PT Stop Time  1405 Also saw pt from 1526-1535 for a total of 29 minutes    PT Time Calculation (min)  20 min    Activity Tolerance  Patient tolerated treatment well    Behavior During Therapy  [facility] for tasks assessed/performed       Past Medical History:  Diagnosis Date  . Acid reflux   . Allergic rhinitis   . Arthritis   . Breast lump   . Bruises easily   . Chicken pox   . Colon polyp   . Gum disease   . Hypertension   . Hypothyroidism   . Incontinence   . Measles   . Night sweats   . Nipple discharge   . Prediabetes 06/2014    Past Surgical History:  Procedure Laterality Date  . ABDOMINAL HYSTERECTOMY  1986   secondary to fibroids  . BREAST SURGERY  07/09/2008   mass removal  . CHOLECYSTECTOMY  03/17/11  . SKIN TAG REMOVAL     brow and lid  . THIGH / KNEE SOFT TISSUE BIOPSY  09/05/2009  . TUBAL LIGATION  1980    There were no vitals filed for this visit.   Subjective Assessment - 01/12/18 1640    Subjective  Patient reports she is here today to be seen by her medical team for her newly diagnosed right breast cancer.    Patient is accompained by:  Family member    Pertinent History  Patient was diagnosed on 12/28/17 with right grade I invasive lobular carcinoma breast cancer. There are 2 areas in the upper outer quadrant that measure 1.7 cm and 1.8 cm. They are ER/PR positive and HER2 negative with a Ki67 of 1%. She has diabetes and hypertension.    Patient Stated Goals  Reduce lymphedema risk and  learn post op shoulder ROM HEP    Currently in Pain?  Yes    Pain Score  5     Pain Location  Scapula    Pain Orientation  Right    Pain Descriptors / Indicators  Dull    Pain Type  Acute pain    Pain Onset  In the past 7 days    Pain Frequency  Intermittent    Aggravating Factors   Pain began while getting breast biopsy last week when she was positioned supine with right hand behind head.    Pain Relieving Factors  Unknown    Multiple Pain Sites  No         [facility] PT Assessment - 01/12/18 0001      Assessment   Medical Diagnosis  Right breast cancer    Referring Provider  Dr. Autumn Messing    Onset Date/Surgical Date  12/28/17    Hand Dominance  Right    Prior Therapy  none      Precautions   Precautions  Other (comment)    Precaution Comments  active cancer      Restrictions  Weight Bearing Restrictions  No      Balance Screen   Has the patient fallen in the past 6 months  No    Has the patient had a decrease in activity level because of a fear of falling?   No    Is the patient reluctant to leave their home because of a fear of falling?   No      Home Environment   Living Environment  Private residence    Living Arrangements  Children Adult daughter Sharl Ma    Available Help at Discharge  Family      Prior Function   Level of [place]  Retired    Leisure  She does not exercise but has a Higher education careers adviser to [employer] and plans to begin going      Cognition   Overall Cognitive Status  Within Functional Limits for tasks assessed      Posture/Postural Control   Posture/Postural Control  Postural limitations    Postural Limitations  Rounded Shoulders;Forward head      ROM / Strength   AROM / PROM / Strength  AROM;Strength      AROM   AROM Assessment Site  Shoulder;Cervical    Right/Left Shoulder  Right;Left    Right Shoulder Extension  44 Degrees    Right Shoulder Flexion  131 Degrees    Right Shoulder ABduction  150 Degrees    Right  Shoulder Internal Rotation  65 Degrees    Right Shoulder External Rotation  76 Degrees    Left Shoulder Extension  45 Degrees    Left Shoulder Flexion  138 Degrees    Left Shoulder ABduction  154 Degrees    Left Shoulder Internal Rotation  62 Degrees    Left Shoulder External Rotation  85 Degrees    Cervical Flexion  WNL    Cervical Extension  WNL    Cervical - Right Side Bend  WNL    Cervical - Left Side Bend  WNL    Cervical - Right Rotation  WNL    Cervical - Left Rotation  WNL      Strength   Overall Strength  Within functional limits for tasks performed        LYMPHEDEMA/ONCOLOGY QUESTIONNAIRE - 01/12/18 1645      Type   Cancer Type  Right breast cancer      Lymphedema Assessments   Lymphedema Assessments  Upper extremities      Right Upper Extremity Lymphedema   10 cm Proximal to Olecranon Process  36.6 cm    Olecranon Process  27.9 cm    10 cm Proximal to Ulnar Styloid Process  26.3 cm    Just Proximal to Ulnar Styloid Process  17.9 cm    Across Hand at [employer]  20 cm    At [place] of 2nd Digit  6.3 cm      Left Upper Extremity Lymphedema   10 cm Proximal to Olecranon Process  37.1 cm    Olecranon Process  28.8 cm    10 cm Proximal to Ulnar Styloid Process  26.4 cm    Just Proximal to Ulnar Styloid Process  17.4 cm    Across Hand at [employer]  19.6 cm    At [place] of 2nd Digit  6 cm             Objective measurements completed on examination: See above findings.    Patient was instructed  today in a home exercise program today for post op shoulder range of motion. These included active assist shoulder flexion in sitting, scapular retraction, wall walking with shoulder abduction, and hands behind head external rotation.  She was encouraged to do these twice a day, holding 3 seconds and repeating 5 times when permitted by her physician.     PT Education - 01/12/18 1645    Education Details  Lymphedema risk reduction and post op shoulder ROM HEP     Person(s) Educated  Patient;Child(ren)    Methods  Explanation;Demonstration;Handout    Comprehension  Returned demonstration;Verbalized understanding          PT Long Term Goals - 01/12/18 1649      PT LONG TERM GOAL #1   Title  Patient will demonstrate she has returned to bsaeline related to shoulder ROM and function post operatively.    Time  [facility] Goals - 01/12/18 1649      Patient will be able to verbalize understanding of pertinent lymphedema risk reduction practices relevant to her diagnosis specifically related to skin care.   Time  1    Period  Days    Status  Achieved      Patient will be able to return demonstrate and/or verbalize understanding of the post-op home exercise program related to regaining shoulder range of motion.   Time  1    Period  Days    Status  Achieved      Patient will be able to verbalize understanding of the importance of attending the postoperative After Breast Cancer Class for further lymphedema risk reduction education and therapeutic exercise.   Time  1    Period  Days    Status  Achieved            Plan - 01/12/18 1646    Clinical Impression Statement  Patient was diagnosed on 12/28/17 with right grade I invasive lobular carcinoma breast cancer. There are 2 areas in the upper outer quadrant that measure 1.7 cm and 1.8 cm. They are ER/PR positive and HER2 negative with a Ki67 of 1%. She has diabetes and hypertension. Her multidisciplinary medical team met prior to her assessments to determine a recommended treatment plan. She is planning to have a right lumpectomy and sentinel node biopsy followed by Oncotype testing, radiation, and anti-estrogen therapy. She will benefit from a post op PT reassessment to determine needs.    History and Personal Factors relevant to plan of care:  None    Clinical Presentation  Stable    Clinical Decision Making  Low    Rehab Potential  Excellent     Clinical Impairments Affecting Rehab Potential  None    PT Frequency  -- Eval and 1 f/u visit    PT Treatment/Interventions  ADLs/Self Care Home Management;Therapeutic exercise;Patient/family education    PT Next Visit Plan  Will reassess 3-4 weeks post op to determine needs    PT Home Exercise Plan  Post op shoulder ROM HEP    Consulted and Agree with Plan of Care  Patient;Family member/caregiver    Family Member Consulted  Daughter Patrice       Patient will benefit from skilled therapeutic intervention in order to improve the following deficits and impairments:  Decreased knowledge of precautions, Impaired UE functional use, Decreased range of motion, Postural dysfunction, Pain  Visit Diagnosis: Malignant neoplasm of upper-outer  quadrant of right breast in female, estrogen receptor positive ([place]) - Plan: PT plan of care cert/re-cert  Abnormal posture - Plan: PT plan of care cert/re-cert   Patient will follow up at outpatient cancer rehab 3-4 weeks following surgery.  If the patient requires physical therapy at that time, a specific plan will be dictated and sent to the referring physician for approval. The patient was educated today on appropriate basic range of motion exercises to begin post operatively and the importance of attending the After Breast Cancer class following surgery.  Patient was educated today on lymphedema risk reduction practices as it pertains to recommendations that will benefit the patient immediately following surgery.  She verbalized good understanding.     Problem List Patient Active Problem List   Diagnosis Date Noted  . Malignant neoplasm of upper-outer quadrant of right breast in female, estrogen receptor positive ([place]) 01/07/2018  . Moderate persistent asthma without complication 72/82/0601  . Seasonal and perennial allergic rhinitis 04/26/2017  . Allergy 01/28/2017  . Allergic rhinitis 11/28/2015  . Cough 11/28/2015  . Breast pain 08/25/2012  . Left  breast mass 10/29/2011  . Symptomatic cholecystitis 02/24/2011  . HYPOTHYROIDISM 10/02/2010  . HYPERTENSION 10/02/2010  . G E R D 10/02/2010  . OSTEOARTHRITIS 10/02/2010    Annia Friendly, PT 01/12/18 4:50 PM  [place], [place], [address] Phone: [phone]   Fax:  [phone]  Name: Lisa Horton MRN: [id number] Date of Birth: 1946/05/15  PHYSICAL THERAPY DISCHARGE SUMMARY  Visits from Start of Care: 2  Current functional level related to goals / functional outcomes: Unknown; she did not return to PT for a f/u post operatively.   Remaining deficits: unknown   Education / Equipment: HEP and lymphedema education Plan: Patient agrees to discharge.  Patient goals were not met. Patient is being discharged due to not returning since the last visit.  ?????         Annia Friendly, [place] 05/18/18 9:49 AM

## 2018-01-13 ENCOUNTER — Inpatient Hospital Stay (HOSPITAL_BASED_OUTPATIENT_CLINIC_OR_DEPARTMENT_OTHER): Payer: Medicare Other | Admitting: Genetics

## 2018-01-13 ENCOUNTER — Encounter: Payer: Self-pay | Admitting: Genetics

## 2018-01-13 ENCOUNTER — Telehealth: Payer: Self-pay | Admitting: Hematology

## 2018-01-13 DIAGNOSIS — Z170 Estrogen receptor positive status [ER+]: Principal | ICD-10-CM

## 2018-01-13 DIAGNOSIS — Z803 Family history of malignant neoplasm of breast: Secondary | ICD-10-CM

## 2018-01-13 DIAGNOSIS — Z8042 Family history of malignant neoplasm of prostate: Secondary | ICD-10-CM

## 2018-01-13 DIAGNOSIS — Z7183 Encounter for nonprocreative genetic counseling: Secondary | ICD-10-CM | POA: Diagnosis not present

## 2018-01-13 DIAGNOSIS — Z800 Family history of malignant neoplasm of digestive organs: Secondary | ICD-10-CM | POA: Diagnosis not present

## 2018-01-13 DIAGNOSIS — C50411 Malignant neoplasm of upper-outer quadrant of right female breast: Secondary | ICD-10-CM

## 2018-01-13 NOTE — Progress Notes (Signed)
[place] Psychosocial Distress Screening Spiritual Care  Met with Lisa Horton and daughter Lisa Horton (who is a breast cancer survivor, was treated at [facility], formerly served as an Producer, television/film/video) in [facility] to introduce [place] team/resources, reviewing distress screen per protocol.  The patient scored a 5 on the Psychosocial Distress Thermometer which indicates moderate distress. Also assessed for distress and other psychosocial needs.   ONCBCN DISTRESS SCREENING 01/13/2018  Screening Type Initial Screening  Distress experienced in past week (1-10) 5  Emotional problem type Adjusting to illness  Spiritual/Religous concerns type Facing my mortality  Information Concerns Type Lack of info about treatment  Physical Problem type Pain   Legrand Rams and Lisa Horton were very welcoming of pastoral presence and verbalized appreciation for [facility] format and team. [place] reports decreased distress with resolution of some questions and uncertainty/unknown. Provided emotional support, normalization of feelings (including confronting mortality), and prayer per request.   Follow up needed: No. Per pt, no other needs at this time. She plans to explore [place] and knows to contact Team anytime, but please also page if immediate needs arise. Thank you.   [place], [place], [facility] Pager [phone] Voicemail [phone]

## 2018-01-13 NOTE — Progress Notes (Addendum)
REFERRING PROVIDER: Truitt Merle, MD [place], [place] [address]  PRIMARY PROVIDER:  Maury Dus, MD  PRIMARY REASON FOR VISIT:  1. Malignant neoplasm of upper-outer quadrant of right breast in female, estrogen receptor positive ([place])   2. Family history of pancreatic cancer   3. Family history of prostate cancer   4. Family history of breast cancer     HISTORY OF PRESENT ILLNESS:   Lisa Horton, a 72 y.o. female, was seen for a [place] cancer genetics consultation at the request of Dr. Burr Medico due to a personal and family history of cancer.  Lisa Horton presents to clinic today to discuss the possibility of a hereditary predisposition to cancer, genetic testing, and to further clarify her future cancer risks, as well as potential cancer risks for family members.   On 01/03/2018, at the age of 14, Lisa Horton was diagnosed with Invasive mammary carcinoma of the right breast ER/PR +, HER2-. She is currently planning on right breast lumpectomy followed by radiation therapy and aromatase inhibitor.   CANCER HISTORY:    Malignant neoplasm of upper-outer quadrant of right breast in female, estrogen receptor positive ([place])   12/28/2017 Mammogram    Bilateral diagnostic mammography with tomography and right breast ultrasonography at [facility] on 12/28/2017 showing: The irregular architectural distortion in the right breast upper outer quadrant posterior depth is indeterminate. The architectural distortion in the right breast upper outer quadrant middle depth is indeterminate.       01/03/2018 Pathology Results    Right needle core biopsy with pathology showing: Breast, right, needle core biopsy, (A) 11 o'clock with invasive mammary carcinoma, grade I-II. Breast, right, needle core biopsy, (B) 11 o'clock with invasive mammary carcinoma, grade I-II. Prognostic indicators significant for: ER, 60% positive with moderate staining intensity and PR, 90% positive with strong staining intensity.  Proliferation marker Ki67 at 1%. HER2 negative.      01/04/2018 Cancer Staging    Staging form: Breast, AJCC 8th Edition - Clinical stage from 01/04/2018: Stage IA (cT1c, cN0, cM0, G2, ER+, PR+, HER2-) - Signed by Truitt Merle, MD on 01/11/2018      01/07/2018 Initial Diagnosis    Malignant neoplasm of upper-outer quadrant of right breast in female, estrogen receptor positive ([place])        HORMONAL RISK FACTORS:  Menarche was at age 27.  OCP use for approximately 12 years.  Ovaries intact: yes.  Hysterectomy: yes.  Menopausal status: postmenopausal.  HRT use: 10 years. Colonoscopy: yes; Sept 2018, reportedly had a couple polyps, told to have next c-scope in 5 years.. Mammogram within the last year: yes.  Past Medical History:  Diagnosis Date  . Acid reflux   . Allergic rhinitis   . Arthritis   . Breast lump   . Bruises easily   . Chicken pox   . Colon polyp   . Family history of breast cancer   . Family history of pancreatic cancer   . Family history of prostate cancer   . Gum disease   . Hypertension   . Hypothyroidism   . Incontinence   . Measles   . Night sweats   . Nipple discharge   . Prediabetes 06/2014    Past Surgical History:  Procedure Laterality Date  . ABDOMINAL HYSTERECTOMY  1986   secondary to fibroids  . BREAST SURGERY  07/09/2008   mass removal  . CHOLECYSTECTOMY  03/17/11  . SKIN TAG REMOVAL     brow and lid  .  THIGH / KNEE SOFT TISSUE BIOPSY  09/05/2009  . TUBAL LIGATION  1980    Social History   Socioeconomic History  . Marital status: Single    Spouse name: Not on file  . Number of children: Not on file  . Years of education: Not on file  . Highest education level: Not on file  Occupational History  . Not on file  Social Needs  . Financial resource strain: Not on file  . Food insecurity:    Worry: Not on file    Inability: Not on file  . Transportation needs:    Medical: Not on file    Non-medical: Not on file  Tobacco Use  . Smoking  status: Former Smoker    Packs/day: 1.00    Years: 48.00    Pack years: 48.00    Types: Cigarettes    Last attempt to quit: 08/03/2008    Years since quitting: 9.4  . Smokeless tobacco: Never Used  Substance and Sexual Activity  . Alcohol use: Yes    Alcohol/week: 0.0 oz    Comment: rare  . Drug use: No  . Sexual activity: Not Currently  Lifestyle  . Physical activity:    Days per week: Not on file    Minutes per session: Not on file  . Stress: Not on file  Relationships  . Social connections:    Talks on phone: Not on file    Gets together: Not on file    Attends religious service: Not on file    Active member of club or organization: Not on file    Attends meetings of clubs or organizations: Not on file    Relationship status: Not on file  Other Topics Concern  . Not on file  Social History Narrative   Single, lives with daughter   2 children   Retired - Glass blower/designer   No recent travel      Financial controller Pulmonary:   Previously worked [employer]. From [place] and has always lived in [place]. No international travel. No pets currently. No bird or mold exposure.      FAMILY HISTORY:  We obtained a detailed, 4-generation family history.  Significant diagnoses are listed below: Family History  Problem Relation Age of Onset  . Hypertension Mother   . Pancreatic cancer Mother 20  . Hypertension Father   . Prostate cancer Father 60  . Emphysema Father   . Hypertension Sister   . Breast cancer Sister        dx >50, caught very early  . Hypertension Brother   . Alzheimer's disease Brother   . Hypertension Sister   . Heart failure Brother   . Hypertension Brother   . Breast cancer Daughter 43       BRCA1/2 negative, never had panel testing  . Allergies Sister   . Allergies Brother   . Throat cancer Maternal Uncle    Lisa Horton has a daughter who is 56 and has a history of breast cancer dx at 29.  She had genetic testing in 2010 that was negative for BRCA1/2- no panel.   Her breast cancer was triple negative. Lisa Horton also has a brother who is 63 with no history of cancer.   Lisa Horton has 2 full sisters and 3 full brothers. 1 sister had breast cancer dx >50- she reports it was found very early. Ms. Gonser also has a paternal half sister with no history of cancer.  No history of cancer I  her brothers, but they are deceased.   Ms. Kohler father: died at 81 due to emphysema and prostate cancer that was dx in his 34's.  Paternal aunts/Uncles: at least 1 paternal aunt and 1 paternal uncle, other aunts/uncles, but details unk.  Paternal cousins: no known history of cancer.  Paternal grandfather: unk cause of death, died in 44middle age' Paternal grandmother:unk cause of death, died 27 aged'  Ms. Antonacci mother: died at 64 due to pancreatic cancer dx in her 38's.   Maternal Aunts/Uncles: 3 maternal uncles, 1 had throat cancer. 1 maternal aunt with no history of cancer.  Maternal cousins: no history of cancer.  Maternal grandfather: no cancer, died in 2middle age' Maternal grandmother:no cancer, died in 27middle age'  Patient's maternal ancestors are of African American/Native American descent, and paternal ancestors are of African American/Native American descent. There is no reported Ashkenazi Jewish ancestry. There is no known consanguinity.  GENETIC COUNSELING ASSESSMENT: MIKIYAH GLASNER is a 72 y.o. female with a personal and family history which is somewhat suggestive of a Hereditary Cancer Predisposition Syndrome. We, therefore, discussed and recommended the following at today's visit.   DISCUSSION: We reviewed the characteristics, features and inheritance patterns of hereditary cancer syndromes. We also discussed genetic testing, including the appropriate family members to test, the process of testing, insurance coverage and turn-around-time for results. We discussed the implications of a negative, positive and/or variant of uncertain significant  result. In order to get genetic test results in a timely manner so that Ms. Riederer can use these genetic test results for surgical decisions, we recommended Ms. Johann pursue genetic testing for the Breast Cancer STAT Panel. We then recommend Ms. Khamis pursue reflex genetic testing to the Multi-Cancer gene panel.   The STAT Breast cancer panel offered by [facility] includes sequencing and rearrangement analysis for the following 9 genes:  ATM, BRCA1, BRCA2, CDH1, CHEK2, PALB2, PTEN, STK11 and TP53.    The Multi-Cancer Panel offered by [facility] includes sequencing and/or deletion duplication testing of the following 83 genes: ALK, APC, ATM, AXIN2,BAP1,  BARD1, BLM, BMPR1A, BRCA1, BRCA2, BRIP1, CASR, CDC73, CDH1, CDK4, CDKN1B, CDKN1C, CDKN2A (p14ARF), CDKN2A (p16INK4a), CEBPA, CHEK2, CTNNA1, DICER1, DIS3L2, EGFR (c.2369C>T, p.Thr790Met variant only), EPCAM (Deletion/duplication testing only), FH, FLCN, GATA2, GPC3, GREM1 (Promoter region deletion/duplication testing only), HOXB13 (c.251G>A, p.Gly84Glu), HRAS, KIT, MAX, MEN1, MET, MITF (c.952G>A, p.Glu318Lys variant only), MLH1, MSH2, MSH3, MSH6, MUTYH, NBN, NF1, NF2, NTHL1, PALB2, PDGFRA, PHOX2B, PMS2, POLD1, POLE, POT1, PRKAR1A, PTCH1, PTEN, RAD50, RAD51C, RAD51D, RB1, RECQL4, RET, RUNX1, SDHAF2, SDHA (sequence changes only), SDHB, SDHC, SDHD, SMAD4, SMARCA4, SMARCB1, SMARCE1, STK11, SUFU, TERC, TERT, TMEM127, TP53, TSC1, TSC2, VHL, WRN and WT1.   We discussed that only 5-10% of cancers are associated with a Hereditary cancer predisposition syndrome.  One of the most common hereditary cancer syndromes that increases breast cancer risk is called Hereditary Breast and Ovarian Cancer (HBOC) syndrome.  This syndrome is caused by mutations in the BRCA1 and BRCA2 genes.  This syndrome increases an individual's lifetime risk to develop breast, ovarian, pancreatic, and other types of cancer.  There are also many other cancer predisposition syndromes caused by mutations  in several other genes.  We discussed that if she is found to have a mutation in one of these genes, it may impact surgical decisions, and alter future medical management recommendations such as increased cancer screenings and consideration of risk reducing surgeries.  A positive result could also have implications for the patient's family members.  A  Negative result would mean we were unable to identify a hereditary component to her cancer, but does not rule out the possibility of a hereditary basis for her cancer.  There could be mutations that are undetectable by current technology, or in genes not yet tested or identified to increase cancer risk.    We discussed the potential to find a Variant of Uncertain Significance or VUS.  These are variants that have not yet been identified as pathogenic or benign, and it is unknown if this variant is associated with increased cancer risk or if this is a normal finding.  Most VUS's are reclassified to benign or likely benign.   It should not be used to make medical management decisions. With time, we suspect the lab will determine the significance of any VUS's identified if any.   Based on Ms. Doubrava's personal and family history of cancer, she meets medical criteria for genetic testing. Despite that she meets criteria, she may still have an out of pocket cost. We discussed patients with medicare typically have $0 OOP cost.   PLAN: After considering the risks, benefits, and limitations, Ms. Stines  provided informed consent to pursue genetic testing and the blood sample was sent to [facility] for analysis of the Breast Cancer STAT Panel with plans to reflex to the Multi-Cancer Panel. Preliminary results should be available within approximately 5-12 days' time, at which point they will be disclosed by telephone to Ms. Mena, as will any additional recommendations warranted by these results. Ms. Bach will receive a summary of her genetic counseling  visit and a copy of her results once available. This information will also be available in Epic. We encouraged Ms. Delsignore to remain in contact with cancer genetics annually so that we can continuously update the family history and inform her of any changes in cancer genetics and testing that may be of benefit for her family. Ms. Frampton questions were answered to her satisfaction today. Our contact information was provided should additional questions or concerns arise.  Based on Ms. Moritz's family history, we recommended her daughter, siblings, and all maternal relatives also have genetic counseling and testing. Ms. Spanbauer will let us know if we can be of any assistance in coordinating genetic counseling and/or testing for this family member.   Lastly, we encouraged Ms. Madkins to remain in contact with cancer genetics annually so that we can continuously update the family history and inform her of any changes in cancer genetics and testing that may be of benefit for this family.   Ms.  Brillhart questions were answered to her satisfaction today. Our contact information was provided should additional questions or concerns arise. Thank you for the referral and allowing [place] to share in the care of your patient.   Tana Felts, MS, [facility] Certified Genetic Counselor [email or web address] phone: [phone]  The patient was seen for a total of 40 minutes in face-to-face genetic counseling.  The patient was accompanied today by her daughter, Sharl Ma This patient was discussed with Drs. Magrinat, Lindi Adie and/or Burr Medico who agrees with the above.

## 2018-01-13 NOTE — Telephone Encounter (Signed)
No LOS 6/12

## 2018-01-18 ENCOUNTER — Telehealth: Payer: Self-pay | Admitting: *Deleted

## 2018-01-18 ENCOUNTER — Ambulatory Visit (HOSPITAL_COMMUNITY): Admission: RE | Admit: 2018-01-18 | Payer: Medicare Other | Source: Ambulatory Visit

## 2018-01-18 ENCOUNTER — Ambulatory Visit (HOSPITAL_COMMUNITY)
Admission: RE | Admit: 2018-01-18 | Discharge: 2018-01-18 | Disposition: A | Discharge status: Home / Self Care | Payer: Medicare Other | Source: Ambulatory Visit | Attending: General Surgery | Admitting: General Surgery

## 2018-01-18 DIAGNOSIS — Z170 Estrogen receptor positive status [ER+]: Secondary | ICD-10-CM | POA: Insufficient documentation

## 2018-01-18 DIAGNOSIS — C50411 Malignant neoplasm of upper-outer quadrant of right female breast: Secondary | ICD-10-CM | POA: Diagnosis present

## 2018-01-18 MED ORDER — GADOBENATE DIMEGLUMINE 529 MG/ML IV SOLN
20.0000 mL | Freq: Once | INTRAVENOUS | Status: AC | PRN
  Administered 2018-01-18: 20 mL via INTRAVENOUS

## 2018-01-18 NOTE — Telephone Encounter (Signed)
Spoke to pt concerning BMDC from 6.12.19. Denies questions or concerns regarding dx or treatment care plan. Encourage pt to call with needs. Received verbal understanding. 

## 2018-01-19 ENCOUNTER — Telehealth: Payer: Self-pay | Admitting: Obstetrics & Gynecology

## 2018-01-19 ENCOUNTER — Encounter: Payer: Self-pay | Admitting: Hematology

## 2018-01-19 ENCOUNTER — Encounter: Payer: Self-pay | Admitting: Obstetrics & Gynecology

## 2018-01-19 NOTE — Telephone Encounter (Signed)
Routing to Dr. Miller as FYI.   Encounter closed.  

## 2018-01-19 NOTE — Telephone Encounter (Signed)
Patient sent the following correspondence through [place]. Routing to triage to assist patient with request.  ----- Message from [place], Generic sent at 01/19/2018 2:16 PM EDT -----    Greetings Dr. [name] Heck,     I hope that your summer is going well. (This note is from [place] on behalf of Lisa Horton)    I cannot thank you enough for ordering a biopsy for my mom. I so appreciate you for listening to her concerns and doing what she felt was required. Fibrocystic breasts run in mom's family so we hoped that was the extent of it.   I am sure you know by now that the biopsies on my mom's breast were positive. I do not have a question. I just wanted to make sure you are in the loop.     Mom's surgery June 11th. We really wanted Dr. Donne Hazel because he did my surgery and really dismissed my mom's concerns at fat over several years. Dr. Donne Hazel is not available until later in July. She is ready to get this out and move forward with her journey. If you feel strongly (concerns) about ANY of her plan, please let us know.     Thank you for being a compassionate and excellent physician and woman! You are one of my champions!    Continued [employer]

## 2018-01-20 ENCOUNTER — Other Ambulatory Visit: Payer: Self-pay | Admitting: General Surgery

## 2018-01-20 ENCOUNTER — Telehealth: Payer: Self-pay | Admitting: Genetics

## 2018-01-20 DIAGNOSIS — R9389 Abnormal findings on diagnostic imaging of other specified body structures: Secondary | ICD-10-CM

## 2018-01-20 NOTE — Telephone Encounter (Signed)
Revealed negative genetic testing for the breast cancer STAT panel.  Still waiting on full panel results, but these are the genes that would potentially impact breast  surgical decisions.   This normal result is reassuring and indicates that it is unlikely Lisa Horton's cancer is due to a hereditary cause.  However, her personal/fhx is still somewhat suspicious for a hereditary predisposition and genetic testing is not perfect, and cannot definitively rule out a hereditary cause.  It will be important for her to keep in contact with genetics to learn if any additional testing may be needed in the future.     We will call when the results from her full panel are ready.

## 2018-01-24 ENCOUNTER — Ambulatory Visit
Admission: RE | Admit: 2018-01-24 | Discharge: 2018-01-24 | Disposition: A | Discharge status: Home / Self Care | Payer: Medicare Other | Source: Ambulatory Visit | Attending: General Surgery | Admitting: General Surgery

## 2018-01-24 DIAGNOSIS — R9389 Abnormal findings on diagnostic imaging of other specified body structures: Secondary | ICD-10-CM

## 2018-01-24 MED ORDER — GADOBENATE DIMEGLUMINE 529 MG/ML IV SOLN
20.0000 mL | Freq: Once | INTRAVENOUS | Status: AC | PRN
  Administered 2018-01-24: 20 mL via INTRAVENOUS

## 2018-01-26 ENCOUNTER — Other Ambulatory Visit: Payer: Self-pay | Admitting: Anesthesiology

## 2018-01-26 ENCOUNTER — Other Ambulatory Visit: Payer: Medicare Other

## 2018-01-26 DIAGNOSIS — M4712 Other spondylosis with myelopathy, cervical region: Secondary | ICD-10-CM

## 2018-01-26 DIAGNOSIS — M4722 Other spondylosis with radiculopathy, cervical region: Secondary | ICD-10-CM

## 2018-01-26 DIAGNOSIS — M25512 Pain in left shoulder: Principal | ICD-10-CM

## 2018-01-26 DIAGNOSIS — G8929 Other chronic pain: Secondary | ICD-10-CM

## 2018-01-28 ENCOUNTER — Telehealth: Payer: Self-pay | Admitting: Genetics

## 2018-01-28 NOTE — Telephone Encounter (Signed)
Revealed negative genetic testing.  Revealed that a VUS in BAP1 was identified.   This normal result is reassuring and indicates that it is unlikely Lisa Horton's cancer is due to a hereditary cause. However, genetic testing is not perfect, and cannot definitively rule out a hereditary cause.  She does still have a significant persona/family history that should be considered and all recommendations should be based on the family history.  It will be important for her to keep in contact with genetics to learn if any additional testing may be needed in the future.     We recommended her relatives also consider genetic testing and that all relatives inform their doctors about the family history so they can recommend the best screening plan for them.

## 2018-02-04 ENCOUNTER — Ambulatory Visit: Payer: Self-pay | Admitting: Genetics

## 2018-02-04 ENCOUNTER — Other Ambulatory Visit: Payer: Self-pay

## 2018-02-04 ENCOUNTER — Encounter (HOSPITAL_BASED_OUTPATIENT_CLINIC_OR_DEPARTMENT_OTHER): Payer: Self-pay | Admitting: *Deleted

## 2018-02-04 ENCOUNTER — Encounter: Payer: Self-pay | Admitting: Genetics

## 2018-02-04 DIAGNOSIS — Z170 Estrogen receptor positive status [ER+]: Secondary | ICD-10-CM

## 2018-02-04 DIAGNOSIS — Z803 Family history of malignant neoplasm of breast: Secondary | ICD-10-CM

## 2018-02-04 DIAGNOSIS — Z8042 Family history of malignant neoplasm of prostate: Secondary | ICD-10-CM

## 2018-02-04 DIAGNOSIS — Z800 Family history of malignant neoplasm of digestive organs: Secondary | ICD-10-CM

## 2018-02-04 DIAGNOSIS — C50411 Malignant neoplasm of upper-outer quadrant of right female breast: Secondary | ICD-10-CM

## 2018-02-04 DIAGNOSIS — Z1379 Encounter for other screening for genetic and chromosomal anomalies: Secondary | ICD-10-CM

## 2018-02-04 NOTE — Progress Notes (Signed)
Patient states she has a small area under left breast initially thought it was a skin tag, but the area was draining for a few days, no fever, pt has been putting ice on area, called Keisha at Dr. Fritz Pickerel office to see about treatment. Instructed patient to contact MD regarding area in case surgery needs to be rescheduled.

## 2018-02-04 NOTE — Progress Notes (Signed)
Spoke with Dr. Ethlyn Gallery nurse regarding questionable infected area to breast.  Will need to be seen by primary care physician since Dr. Marlou Starks is not in the office.  Instructed pt to call Primary and have area assessed, then inform Dr. Ethlyn Gallery office of results.

## 2018-02-04 NOTE — Progress Notes (Signed)
Called Dr. Ventura Bruns office at [facility] and informed scheduler of need for pt to be seen today for possible infection. If they are unable to work pt in she needs to be sent to Urgent care and then call Dr. Ethlyn Gallery office with results.

## 2018-02-04 NOTE — Progress Notes (Signed)
HPI:  Ms. Rotter was previously seen in the [facility] on 01/13/2018 due to a personal and family history of cancer and concerns regarding a hereditary predisposition to cancer. Please refer to our prior cancer genetics clinic note for more information regarding Ms. Ungaro's medical, social and family histories, and our assessment and recommendations, at the time. Ms. Aven recent genetic test results were disclosed to her, as well as recommendations warranted by these results. These results and recommendations are discussed in more detail below.  CANCER HISTORY:    Malignant neoplasm of upper-outer quadrant of right breast in female, estrogen receptor positive ([place])   12/28/2017 Mammogram    Bilateral diagnostic mammography with tomography and right breast ultrasonography at [facility] on 12/28/2017 showing: The irregular architectural distortion in the right breast upper outer quadrant posterior depth is indeterminate. The architectural distortion in the right breast upper outer quadrant middle depth is indeterminate.       01/03/2018 Pathology Results    Right needle core biopsy with pathology showing: Breast, right, needle core biopsy, (A) 11 o'clock with invasive mammary carcinoma, grade I-II. Breast, right, needle core biopsy, (B) 11 o'clock with invasive mammary carcinoma, grade I-II. Prognostic indicators significant for: ER, 60% positive with moderate staining intensity and PR, 90% positive with strong staining intensity. Proliferation marker Ki67 at 1%. HER2 negative.      01/04/2018 Cancer Staging    Staging form: Breast, AJCC 8th Edition - Clinical stage from 01/04/2018: Stage IA (cT1c, cN0, cM0, G2, ER+, PR+, HER2-) - Signed by Truitt Merle, MD on 01/11/2018      01/07/2018 Initial Diagnosis    Malignant neoplasm of upper-outer quadrant of right breast in female, estrogen receptor positive ([place])      01/25/2018 Genetic Testing    The Multi-Cancer Panel offered by [facility]  includes sequencing and/or deletion duplication testing of the following 83 genes: ALK, APC, ATM, AXIN2,BAP1,  BARD1, BLM, BMPR1A, BRCA1, BRCA2, BRIP1, CASR, CDC73, CDH1, CDK4, CDKN1B, CDKN1C, CDKN2A (p14ARF), CDKN2A (p16INK4a), CEBPA, CHEK2, CTNNA1, DICER1, DIS3L2, EGFR (c.2369C>T, p.Thr790Met variant only), EPCAM (Deletion/duplication testing only), FH, FLCN, GATA2, GPC3, GREM1 (Promoter region deletion/duplication testing only), HOXB13 (c.251G>A, p.Gly84Glu), HRAS, KIT, MAX, MEN1, MET, MITF (c.952G>A, p.Glu318Lys variant only), MLH1, MSH2, MSH3, MSH6, MUTYH, NBN, NF1, NF2, NTHL1, PALB2, PDGFRA, PHOX2B, PMS2, POLD1, POLE, POT1, PRKAR1A, PTCH1, PTEN, RAD50, RAD51C, RAD51D, RB1, RECQL4, RET, RUNX1, SDHAF2, SDHA (sequence changes only), SDHB, SDHC, SDHD, SMAD4, SMARCA4, SMARCB1, SMARCE1, STK11, SUFU, TERC, TERT, TMEM127, TP53, TSC1, TSC2, VHL, WRN and WT1.   Results: No pathogenic variants identified.  A Variant of Uncertain significance in BAP1 was identified c.1066C>T (p.Arg356Trp).  The date of this test report is 01/25/2018.         FAMILY HISTORY:  We obtained a detailed, 4-generation family history.  Significant diagnoses are listed below: Family History  Problem Relation Age of Onset  . Hypertension Mother   . Pancreatic cancer Mother 33  . Hypertension Father   . Prostate cancer Father 48  . Emphysema Father   . Hypertension Sister   . Breast cancer Sister        dx >50, caught very early  . Hypertension Brother   . Alzheimer's disease Brother   . Hypertension Sister   . Heart failure Brother   . Hypertension Brother   . Breast cancer Daughter 12       BRCA1/2 negative, never had panel testing  . Allergies Sister   . Allergies Brother   . Throat  cancer Maternal Uncle     Ms. Callanan has a daughter who is 64 and has a history of breast cancer dx at 62.  She had genetic testing in 2010 that was negative for BRCA1/2- no panel.  Her breast cancer was triple negative. Ms. Epler also  has a brother who is 64 with no history of cancer.   Ms. Dittrich has 2 full sisters and 3 full brothers. 1 sister had breast cancer dx >50- she reports it was found very early. Ms. Easton also has a paternal half sister with no history of cancer.  No history of cancer I her brothers, but they are deceased.   Ms. Guttierrez father: died at 58 due to emphysema and prostate cancer that was dx in his 79's.  Paternal aunts/Uncles: at least 1 paternal aunt and 1 paternal uncle, other aunts/uncles, but details unk.  Paternal cousins: no known history of cancer.  Paternal grandfather: unk cause of death, died in 67middle age' Paternal grandmother:unk cause of death, died 13 aged'  Ms. Waterbury mother: died at 68 due to pancreatic cancer dx in her 59's.   Maternal Aunts/Uncles: 3 maternal uncles, 1 had throat cancer. 1 maternal aunt with no history of cancer.  Maternal cousins: no history of cancer.  Maternal grandfather: no cancer, died in 71middle age' Maternal grandmother:no cancer, died in 11middle age'  Patient's maternal ancestors are of African American/Native American descent, and paternal ancestors are of African American/Native American descent. There is no reported Ashkenazi Jewish ancestry. There is no known consanguinity.  GENETIC TEST RESULTS: Genetic testing performed through [facility]'s Multi-Cancer Panel reported out on 01/25/2018 showed no pathogenic mutations. The Multi-Cancer Panel offered by [facility] includes sequencing and/or deletion duplication testing of the following 83 genes: ALK, APC, ATM, AXIN2,BAP1,  BARD1, BLM, BMPR1A, BRCA1, BRCA2, BRIP1, CASR, CDC73, CDH1, CDK4, CDKN1B, CDKN1C, CDKN2A (p14ARF), CDKN2A (p16INK4a), CEBPA, CHEK2, CTNNA1, DICER1, DIS3L2, EGFR (c.2369C>T, p.Thr790Met variant only), EPCAM (Deletion/duplication testing only), FH, FLCN, GATA2, GPC3, GREM1 (Promoter region deletion/duplication testing only), HOXB13 (c.251G>A, p.Gly84Glu), HRAS, KIT, MAX, MEN1,  MET, MITF (c.952G>A, p.Glu318Lys variant only), MLH1, MSH2, MSH3, MSH6, MUTYH, NBN, NF1, NF2, NTHL1, PALB2, PDGFRA, PHOX2B, PMS2, POLD1, POLE, POT1, PRKAR1A, PTCH1, PTEN, RAD50, RAD51C, RAD51D, RB1, RECQL4, RET, RUNX1, SDHAF2, SDHA (sequence changes only), SDHB, SDHC, SDHD, SMAD4, SMARCA4, SMARCB1, SMARCE1, STK11, SUFU, TERC, TERT, TMEM127, TP53, TSC1, TSC2, VHL, WRN and WT1. .  A variant of uncertain significance (VUS) in a gene called BAP1 was also noted. c.1066C>T (p.Arg356Trp  The test report will be scanned into EPIC and will be located under the Molecular Pathology section of the Results Review tab. A portion of the result report is included below for reference.     We discussed with Ms. Behunin that because current genetic testing is not perfect, it is possible there may be a gene mutation in one of these genes that current testing cannot detect, but that chance is small.  We also discussed, that there could be another gene that has not yet been discovered, or that we have not yet tested, that is responsible for the cancer diagnoses in the family. It is also possible there is a hereditary cause for the cancer in the family that Ms. Lafortune did not inherit and therefore was not identified in her testing.  Therefore, it is important to remain in touch with cancer genetics in the future so that we can continue to offer Ms. Mcdonald the most up to date genetic testing.   Regarding the VUS  in BAP1: At this time, it is unknown if this variant is associated with increased cancer risk or if this is a normal finding, but most variants such as this get reclassified to being inconsequential. It should not be used to make medical management decisions. With time, we suspect the lab will determine the significance of this variant, if any. If we do learn more about it, we will try to contact Ms. Pineau to discuss it further. However, it is important to stay in touch with [place] periodically and keep the address and  phone number up to date.  ADDITIONAL GENETIC TESTING: We discussed with Ms. Diffee that her genetic testing was fairly extensive.  If there are are genes identified to increase cancer risk that can be analyzed in the future, we would be happy to discuss and coordinate this testing at that time.    CANCER SCREENING RECOMMENDATIONS: This negative result means that we were unable to identify a hereditary cause for her personal and family history of cancer at this time.  While reassuring, this result does not rule out a hereditary cause for her/her family's cancer.   It is still possible that there could be genetic mutations that are undetectable by current technology, or genetic mutations in genes that have not been tested or identified to increase cancer risk.  Therefore, it is recommended she continue to follow the cancer management and screening guidelines provided by her oncology and primary healthcare provider. An individual's cancer risk is not determined by genetic test results alone.  Overall cancer risk assessment includes additional factors such as personal medical history, family history, etc.  These should be used to make a personalized plan for cancer prevention and surveillance.    RECOMMENDATIONS FOR FAMILY MEMBERS:  Relatives in this family might be at some increased risk of developing cancer, over the general population risk, simply due to the family history of cancer.  We recommended women in this family have a yearly mammogram beginning at age 49, or 4 years younger than the earliest onset of cancer, an annual clinical breast exam, and perform monthly breast self-exams. Women in this family should also have a gynecological exam as recommended by their primary provider. All family members should have a colonoscopy by age 84 (or as directed by their doctors).  All family members should inform their physicians about the family history of cancer so their doctors can make the most appropriate  screening recommendations for them.   It is also possible there is a hereditary cause for the cancer in Ms. Martinec's family that she did not inherit and therefore was not identified in her.  We recommended daughter have updated genetic testing, and other maternal relatives/siblings also have genetic counseling and testing. Ms. Putnam will let us know if we can be of any assistance in coordinating genetic counseling and/or testing for these family members.   FOLLOW-UP: Lastly, we discussed with Ms. Tindall that cancer genetics is a rapidly advancing field and it is possible that new genetic tests will be appropriate for her and/or her family members in the future. We encouraged her to remain in contact with cancer genetics on an annual basis so we can update her personal and family histories and let her know of advances in cancer genetics that may benefit this family.   Our contact number was provided. Ms. Tony questions were answered to her satisfaction, and she knows she is welcome to call us at anytime with additional questions or concerns.   Ria Comment  Tamala Julian, [place], [facility] Certified Genetic Counselor [email or web address]

## 2018-02-07 ENCOUNTER — Encounter: Payer: Self-pay | Admitting: Hematology

## 2018-02-07 ENCOUNTER — Telehealth: Payer: Self-pay | Admitting: Obstetrics & Gynecology

## 2018-02-07 ENCOUNTER — Telehealth: Payer: Self-pay

## 2018-02-07 NOTE — Telephone Encounter (Signed)
Call to patient. Diagnosed with breast cancer in right breast, 2 different spots.  Expressed concern over procedures. Lumpectomy versus mastectomy.  Has requested 2nd opinion. This is not yet scheduled.  Would like to speak with Dr [name] Heck if possible. Advised she is with patients. May be later in the week. Patient understands.

## 2018-02-07 NOTE — Progress Notes (Signed)
Called pt this morning to follow up on questionable breast wound.  Pt states she has an appointment at Dr. Ethlyn Gallery office today at 28.

## 2018-02-07 NOTE — Telephone Encounter (Signed)
I have discussed with Dr. Marlou Starks, and called pt back afterwards. I reviewed her breast MRI findings. She would like to switch her surgeon to Dr. Donne Hazel, and she will cancel her surgery with Dr. Marlou Starks on 02/10/18.   Truitt Merle MD

## 2018-02-07 NOTE — Telephone Encounter (Signed)
Patient calls stating she saw Dr. Raquel Sarna the surgeon this morning and he is telling her he may have to remove the entire breast which was not discussed previously.  Her surgery is schedule for this Thursday. She is very upset with the surgeon.  She is requesting a phone call from Dr. Burr Medico.  Home [phone] Cell# [phone]

## 2018-02-07 NOTE — Telephone Encounter (Signed)
Patient called and left a message during lunch. She said she is reaching out because she has a new breast cancer diagnosis and she'd like some guidance from Dr. [name] Heck. She said she is not sure where to start and she needs help with putting together a care team.  Last seen: 12/20/17

## 2018-02-10 ENCOUNTER — Ambulatory Visit (HOSPITAL_BASED_OUTPATIENT_CLINIC_OR_DEPARTMENT_OTHER): Admission: RE | Admit: 2018-02-10 | Payer: Medicare Other | Source: Ambulatory Visit | Admitting: General Surgery

## 2018-02-10 ENCOUNTER — Other Ambulatory Visit (HOSPITAL_COMMUNITY): Payer: Medicare Other

## 2018-02-10 HISTORY — DX: Unspecified asthma, uncomplicated: J45.909

## 2018-02-10 HISTORY — DX: Malignant (primary) neoplasm, unspecified: C80.1

## 2018-02-10 HISTORY — DX: Myopathy, unspecified: G72.9

## 2018-02-10 HISTORY — DX: Prediabetes: R73.03

## 2018-02-10 SURGERY — BREAST LUMPECTOMY WITH RADIOACTIVE SEED AND SENTINEL LYMPH NODE BIOPSY
Anesthesia: General | Site: Breast | Laterality: Right

## 2018-02-11 NOTE — Telephone Encounter (Signed)
Called pt and questions were all answered.  Going to see Dr. Donne Hazel for a second opinion.  Advised to call back any questions.  Ok to close encounter.

## 2018-02-17 ENCOUNTER — Other Ambulatory Visit: Payer: Self-pay | Admitting: General Surgery

## 2018-02-17 DIAGNOSIS — Z170 Estrogen receptor positive status [ER+]: Principal | ICD-10-CM

## 2018-02-17 DIAGNOSIS — C50411 Malignant neoplasm of upper-outer quadrant of right female breast: Secondary | ICD-10-CM

## 2018-02-23 ENCOUNTER — Encounter: Payer: Self-pay | Admitting: Obstetrics & Gynecology

## 2018-03-02 ENCOUNTER — Other Ambulatory Visit: Payer: Self-pay

## 2018-03-02 ENCOUNTER — Encounter (HOSPITAL_BASED_OUTPATIENT_CLINIC_OR_DEPARTMENT_OTHER): Payer: Self-pay | Admitting: *Deleted

## 2018-03-04 ENCOUNTER — Encounter (HOSPITAL_BASED_OUTPATIENT_CLINIC_OR_DEPARTMENT_OTHER)
Admission: RE | Admit: 2018-03-04 | Discharge: 2018-03-04 | Disposition: A | Discharge status: Home / Self Care | Payer: Medicare Other | Source: Ambulatory Visit | Attending: General Surgery | Admitting: General Surgery

## 2018-03-04 DIAGNOSIS — I10 Essential (primary) hypertension: Secondary | ICD-10-CM | POA: Diagnosis present

## 2018-03-07 ENCOUNTER — Other Ambulatory Visit (HOSPITAL_COMMUNITY): Payer: Medicare Other

## 2018-03-09 ENCOUNTER — Encounter (HOSPITAL_BASED_OUTPATIENT_CLINIC_OR_DEPARTMENT_OTHER): Payer: Self-pay | Admitting: Anesthesiology

## 2018-03-09 ENCOUNTER — Ambulatory Visit (HOSPITAL_BASED_OUTPATIENT_CLINIC_OR_DEPARTMENT_OTHER): Payer: Medicare Other | Admitting: Anesthesiology

## 2018-03-09 ENCOUNTER — Other Ambulatory Visit: Payer: Self-pay

## 2018-03-09 ENCOUNTER — Ambulatory Visit (HOSPITAL_BASED_OUTPATIENT_CLINIC_OR_DEPARTMENT_OTHER)
Admission: RE | Admit: 2018-03-09 | Discharge: 2018-03-09 | Disposition: A | Discharge status: Home / Self Care | Payer: Medicare Other | Source: Ambulatory Visit | Attending: General Surgery | Admitting: General Surgery

## 2018-03-09 ENCOUNTER — Ambulatory Visit (HOSPITAL_COMMUNITY)
Admission: RE | Admit: 2018-03-09 | Discharge: 2018-03-09 | Disposition: A | Discharge status: Home / Self Care | Payer: Medicare Other | Source: Ambulatory Visit | Attending: General Surgery | Admitting: General Surgery

## 2018-03-09 ENCOUNTER — Encounter (HOSPITAL_BASED_OUTPATIENT_CLINIC_OR_DEPARTMENT_OTHER)
Admission: RE | Disposition: A | Discharge status: Home / Self Care | Payer: Self-pay | Source: Ambulatory Visit | Attending: General Surgery

## 2018-03-09 DIAGNOSIS — I10 Essential (primary) hypertension: Secondary | ICD-10-CM | POA: Insufficient documentation

## 2018-03-09 DIAGNOSIS — K219 Gastro-esophageal reflux disease without esophagitis: Secondary | ICD-10-CM | POA: Insufficient documentation

## 2018-03-09 DIAGNOSIS — C50411 Malignant neoplasm of upper-outer quadrant of right female breast: Secondary | ICD-10-CM

## 2018-03-09 DIAGNOSIS — Z79899 Other long term (current) drug therapy: Secondary | ICD-10-CM | POA: Insufficient documentation

## 2018-03-09 DIAGNOSIS — E039 Hypothyroidism, unspecified: Secondary | ICD-10-CM | POA: Diagnosis not present

## 2018-03-09 DIAGNOSIS — Z170 Estrogen receptor positive status [ER+]: Principal | ICD-10-CM

## 2018-03-09 DIAGNOSIS — Z87891 Personal history of nicotine dependence: Secondary | ICD-10-CM | POA: Diagnosis not present

## 2018-03-09 DIAGNOSIS — Z6841 Body Mass Index (BMI) 40.0 and over, adult: Secondary | ICD-10-CM | POA: Diagnosis not present

## 2018-03-09 HISTORY — PX: BREAST LUMPECTOMY WITH RADIOACTIVE SEED AND SENTINEL LYMPH NODE BIOPSY: SHX6550

## 2018-03-09 SURGERY — BREAST LUMPECTOMY WITH RADIOACTIVE SEED AND SENTINEL LYMPH NODE BIOPSY
Anesthesia: General | Site: Breast | Laterality: Right

## 2018-03-09 MED ORDER — PHENYLEPHRINE HCL 10 MG/ML IJ SOLN
INTRAMUSCULAR | Status: DC | PRN
  Administered 2018-03-09 (×6): 80 ug via INTRAVENOUS
  Administered 2018-03-09: 160 ug via INTRAVENOUS
  Administered 2018-03-09: 80 ug via INTRAVENOUS
  Administered 2018-03-09: 160 ug via INTRAVENOUS

## 2018-03-09 MED ORDER — ENSURE PRE-SURGERY PO LIQD: 296.0000 mL | Freq: Once | ORAL | Status: DC

## 2018-03-09 MED ORDER — ACETAMINOPHEN 500 MG PO TABS
ORAL_TABLET | ORAL | Status: AC
  Filled 2018-03-09: qty 2

## 2018-03-09 MED ORDER — PROPOFOL 10 MG/ML IV BOLUS
INTRAVENOUS | Status: AC
  Filled 2018-03-09: qty 20

## 2018-03-09 MED ORDER — FENTANYL CITRATE (PF) 100 MCG/2ML IJ SOLN
50.0000 ug | INTRAMUSCULAR | Status: DC | PRN
  Administered 2018-03-09: 50 ug via INTRAVENOUS

## 2018-03-09 MED ORDER — ONDANSETRON HCL 4 MG/2ML IJ SOLN
INTRAMUSCULAR | Status: DC | PRN
  Administered 2018-03-09: 4 mg via INTRAVENOUS

## 2018-03-09 MED ORDER — EPHEDRINE SULFATE 50 MG/ML IJ SOLN
INTRAMUSCULAR | Status: DC | PRN
  Administered 2018-03-09 (×4): 10 mg via INTRAVENOUS

## 2018-03-09 MED ORDER — GABAPENTIN 100 MG PO CAPS
ORAL_CAPSULE | ORAL | Status: AC
  Filled 2018-03-09: qty 1

## 2018-03-09 MED ORDER — GABAPENTIN 100 MG PO CAPS
100.0000 mg | ORAL_CAPSULE | ORAL | Status: AC
  Administered 2018-03-09: 100 mg via ORAL

## 2018-03-09 MED ORDER — OXYCODONE HCL 5 MG/5ML PO SOLN: 5.0000 mg | Freq: Once | ORAL | Status: DC | PRN

## 2018-03-09 MED ORDER — FENTANYL CITRATE (PF) 100 MCG/2ML IJ SOLN
INTRAMUSCULAR | Status: AC
  Filled 2018-03-09: qty 2

## 2018-03-09 MED ORDER — SODIUM CHLORIDE 0.9 % IJ SOLN
INTRAMUSCULAR | Status: AC
  Filled 2018-03-09: qty 10

## 2018-03-09 MED ORDER — ONDANSETRON HCL 4 MG/2ML IJ SOLN
INTRAMUSCULAR | Status: AC
  Filled 2018-03-09: qty 2

## 2018-03-09 MED ORDER — DEXAMETHASONE SODIUM PHOSPHATE 10 MG/ML IJ SOLN
INTRAMUSCULAR | Status: AC
  Filled 2018-03-09: qty 1

## 2018-03-09 MED ORDER — CEFAZOLIN SODIUM-DEXTROSE 2-4 GM/100ML-% IV SOLN
2.0000 g | INTRAVENOUS | Status: AC
  Administered 2018-03-09: 2 g via INTRAVENOUS

## 2018-03-09 MED ORDER — CEFAZOLIN SODIUM-DEXTROSE 2-4 GM/100ML-% IV SOLN
INTRAVENOUS | Status: AC
  Filled 2018-03-09: qty 100

## 2018-03-09 MED ORDER — DEXAMETHASONE SODIUM PHOSPHATE 4 MG/ML IJ SOLN
INTRAMUSCULAR | Status: DC | PRN
  Administered 2018-03-09: 10 mg via INTRAVENOUS

## 2018-03-09 MED ORDER — MIDAZOLAM HCL 2 MG/2ML IJ SOLN
INTRAMUSCULAR | Status: AC
  Filled 2018-03-09: qty 2

## 2018-03-09 MED ORDER — CHLORHEXIDINE GLUCONATE CLOTH 2 % EX PADS: 6.0000 | MEDICATED_PAD | Freq: Once | CUTANEOUS | Status: DC

## 2018-03-09 MED ORDER — FENTANYL CITRATE (PF) 100 MCG/2ML IJ SOLN
INTRAMUSCULAR | Status: DC | PRN
  Administered 2018-03-09: 100 ug via INTRAVENOUS
  Administered 2018-03-09: 25 ug via INTRAVENOUS

## 2018-03-09 MED ORDER — MIDAZOLAM HCL 2 MG/2ML IJ SOLN
1.0000 mg | INTRAMUSCULAR | Status: DC | PRN
  Administered 2018-03-09: 2 mg via INTRAVENOUS

## 2018-03-09 MED ORDER — PHENYLEPHRINE 40 MCG/ML (10ML) SYRINGE FOR IV PUSH (FOR BLOOD PRESSURE SUPPORT)
PREFILLED_SYRINGE | INTRAVENOUS | Status: AC
  Filled 2018-03-09: qty 20

## 2018-03-09 MED ORDER — ROPIVACAINE HCL 5 MG/ML IJ SOLN
INTRAMUSCULAR | Status: DC | PRN
  Administered 2018-03-09: 30 mL via PERINEURAL

## 2018-03-09 MED ORDER — TECHNETIUM TC 99M SULFUR COLLOID FILTERED
1.0000 | Freq: Once | INTRAVENOUS | Status: AC | PRN
  Administered 2018-03-09: 1 via INTRADERMAL

## 2018-03-09 MED ORDER — OXYCODONE HCL 5 MG PO TABS: 5.0000 mg | ORAL_TABLET | Freq: Four times a day (QID) | ORAL | 0 refills | Status: DC | PRN

## 2018-03-09 MED ORDER — LACTATED RINGERS IV SOLN
INTRAVENOUS | Status: DC
  Administered 2018-03-09 (×3): via INTRAVENOUS

## 2018-03-09 MED ORDER — OXYCODONE HCL 5 MG PO TABS: 5.0000 mg | ORAL_TABLET | Freq: Once | ORAL | Status: DC | PRN

## 2018-03-09 MED ORDER — MIDAZOLAM HCL 5 MG/5ML IJ SOLN
INTRAMUSCULAR | Status: DC | PRN
  Administered 2018-03-09: 2 mg via INTRAVENOUS

## 2018-03-09 MED ORDER — ACETAMINOPHEN 500 MG PO TABS
1000.0000 mg | ORAL_TABLET | ORAL | Status: AC
  Administered 2018-03-09: 1000 mg via ORAL

## 2018-03-09 MED ORDER — METHYLENE BLUE 0.5 % INJ SOLN
INTRAVENOUS | Status: AC
  Filled 2018-03-09: qty 10

## 2018-03-09 MED ORDER — PHENYLEPHRINE 40 MCG/ML (10ML) SYRINGE FOR IV PUSH (FOR BLOOD PRESSURE SUPPORT)
PREFILLED_SYRINGE | INTRAVENOUS | Status: AC
  Filled 2018-03-09: qty 10

## 2018-03-09 MED ORDER — SCOPOLAMINE 1 MG/3DAYS TD PT72: 1.0000 | MEDICATED_PATCH | Freq: Once | TRANSDERMAL | Status: DC | PRN

## 2018-03-09 MED ORDER — EPHEDRINE 5 MG/ML INJ
INTRAVENOUS | Status: AC
  Filled 2018-03-09: qty 10

## 2018-03-09 MED ORDER — BUPIVACAINE HCL (PF) 0.25 % IJ SOLN
INTRAMUSCULAR | Status: AC
  Filled 2018-03-09: qty 30

## 2018-03-09 MED ORDER — PROMETHAZINE HCL 25 MG/ML IJ SOLN: 6.2500 mg | INTRAMUSCULAR | Status: DC | PRN

## 2018-03-09 MED ORDER — BUPIVACAINE HCL (PF) 0.25 % IJ SOLN
INTRAMUSCULAR | Status: DC | PRN
  Administered 2018-03-09: 9 mL

## 2018-03-09 MED ORDER — HYDROMORPHONE HCL 1 MG/ML IJ SOLN: 0.2500 mg | INTRAMUSCULAR | Status: DC | PRN

## 2018-03-09 MED ORDER — MEPERIDINE HCL 25 MG/ML IJ SOLN: 6.2500 mg | INTRAMUSCULAR | Status: DC | PRN

## 2018-03-09 MED ORDER — LIDOCAINE 2% (20 MG/ML) 5 ML SYRINGE
INTRAMUSCULAR | Status: AC
Start: 2018-03-09 — End: ?
  Filled 2018-03-09: qty 5

## 2018-03-09 MED ORDER — LIDOCAINE HCL (CARDIAC) PF 100 MG/5ML IV SOSY
PREFILLED_SYRINGE | INTRAVENOUS | Status: DC | PRN
  Administered 2018-03-09: 30 mg via INTRAVENOUS

## 2018-03-09 MED ORDER — PROPOFOL 10 MG/ML IV BOLUS
INTRAVENOUS | Status: DC | PRN
  Administered 2018-03-09: 150 mg via INTRAVENOUS

## 2018-03-09 SURGICAL SUPPLY — 46 items
APPLIER CLIP 9.375 MED OPEN (MISCELLANEOUS) ×2
BINDER BREAST XXLRG (GAUZE/BANDAGES/DRESSINGS) ×2 IMPLANT
BLADE SURG 15 STRL LF DISP TIS (BLADE) ×1 IMPLANT
BLADE SURG 15 STRL SS (BLADE) ×1
CANISTER SUCT 1200ML W/VALVE (MISCELLANEOUS) ×2 IMPLANT
CHLORAPREP W/TINT 26ML (MISCELLANEOUS) ×2 IMPLANT
CLIP APPLIE 9.375 MED OPEN (MISCELLANEOUS) ×1 IMPLANT
CLIP VESOCCLUDE SM WIDE 6/CT (CLIP) ×2 IMPLANT
COVER BACK TABLE 60X90IN (DRAPES) ×2 IMPLANT
COVER MAYO STAND STRL (DRAPES) ×2 IMPLANT
COVER PROBE W GEL 5X96 (DRAPES) ×2 IMPLANT
DERMABOND ADVANCED (GAUZE/BANDAGES/DRESSINGS) ×1
DERMABOND ADVANCED .7 DNX12 (GAUZE/BANDAGES/DRESSINGS) ×1 IMPLANT
DEVICE DUBIN W/COMP PLATE 8390 (MISCELLANEOUS) ×2 IMPLANT
DRAPE LAPAROSCOPIC ABDOMINAL (DRAPES) ×2 IMPLANT
DRAPE UTILITY XL STRL (DRAPES) ×2 IMPLANT
ELECT COATED BLADE 2.86 ST (ELECTRODE) ×2 IMPLANT
ELECT REM PT RETURN 9FT ADLT (ELECTROSURGICAL) ×2
ELECTRODE REM PT RTRN 9FT ADLT (ELECTROSURGICAL) ×1 IMPLANT
GLOVE BIO SURGEON STRL SZ 6.5 (GLOVE) ×2 IMPLANT
GLOVE BIO SURGEON STRL SZ7 (GLOVE) ×4 IMPLANT
GLOVE BIOGEL PI IND STRL 7.0 (GLOVE) ×1 IMPLANT
GLOVE BIOGEL PI IND STRL 7.5 (GLOVE) ×1 IMPLANT
GLOVE BIOGEL PI INDICATOR 7.0 (GLOVE) ×1
GLOVE BIOGEL PI INDICATOR 7.5 (GLOVE) ×1
GOWN STRL REUS W/ TWL LRG LVL3 (GOWN DISPOSABLE) ×2 IMPLANT
GOWN STRL REUS W/TWL LRG LVL3 (GOWN DISPOSABLE) ×2
HEMOSTAT ARISTA ABSORB 3G PWDR (MISCELLANEOUS) ×2 IMPLANT
KIT MARKER MARGIN INK (KITS) ×2 IMPLANT
NEEDLE HYPO 25X1 1.5 SAFETY (NEEDLE) ×2 IMPLANT
NS IRRIG 1000ML POUR BTL (IV SOLUTION) IMPLANT
PACK BASIN DAY SURGERY FS (CUSTOM PROCEDURE TRAY) ×2 IMPLANT
PENCIL BUTTON HOLSTER BLD 10FT (ELECTRODE) ×2 IMPLANT
SLEEVE SCD COMPRESS KNEE MED (MISCELLANEOUS) ×2 IMPLANT
SPONGE LAP 4X18 RFD (DISPOSABLE) ×4 IMPLANT
STRIP CLOSURE SKIN 1/2X4 (GAUZE/BANDAGES/DRESSINGS) ×2 IMPLANT
SUT MNCRL AB 4-0 PS2 18 (SUTURE) ×2 IMPLANT
SUT VIC AB 2-0 SH 27 (SUTURE) ×7
SUT VIC AB 2-0 SH 27XBRD (SUTURE) ×7 IMPLANT
SUT VIC AB 3-0 SH 27 (SUTURE) ×2
SUT VIC AB 3-0 SH 27X BRD (SUTURE) ×2 IMPLANT
SYR CONTROL 10ML LL (SYRINGE) ×2 IMPLANT
TOWEL GREEN STERILE FF (TOWEL DISPOSABLE) ×2 IMPLANT
TOWEL OR NON WOVEN STRL DISP B (DISPOSABLE) ×2 IMPLANT
TUBE CONNECTING 20X1/4 (TUBING) ×2 IMPLANT
YANKAUER SUCT BULB TIP NO VENT (SUCTIONS) ×2 IMPLANT

## 2018-03-09 NOTE — Interval H&P Note (Signed)
History and Physical Interval Note:  03/09/2018 11:10 AM  Lisa Horton  has presented today for surgery, with the diagnosis of RIGHT BREAST CANCER  The various methods of treatment have been discussed with the patient and family. After consideration of risks, benefits and other options for treatment, the patient has consented to  Procedure(s): RIGHT BREAST LUMPECTOMY WITH BRACKETED RADIOACTIVE SEED AND SENTINEL LYMPH NODE BIOPSY (Right) as a surgical intervention .  The patient's history has been reviewed, patient examined, no change in status, stable for surgery.  I have reviewed the patient's chart and labs.  Questions were answered to the patient's satisfaction.     Rolm Bookbinder

## 2018-03-09 NOTE — Op Note (Signed)
Preoperative diagnosis: Clinical stage II right breast invasive lobular cancer Postoperative diagnosis: Same as above Procedure: 1.  Right breast seed bracketed lumpectomy 2.  Right deep axillary sentinel lymph node biopsy Surgeon: Dr. Serita Grammes Anesthesia: General with pectoral block Estimated blood loss: 20 cc Specimens: 1.  Right breast tissue containing 2 seeds and 2 clips marked with paint 2.  Right axillary sentinel lymph nodes with highest count of 784 Complications: None Drains: None Sponge and count was correct at completion Decision to recovery stable condition  Indications: This is a 72 year old femaleWho I know from her daughter's breast cancer previously.  She was noted on screening mammography to have a right breast distortion and 2 masses.  Both of these were biopsied and are hormone receptor positive invasive lobular cancer.  There is a larger area on an MRI.  We discussed all of her different options and elected to proceed with a seed bracketed lumpectomy and sentinel node biopsy.  Procedure: After informed consent was obtained the patient was administered a pectoral block.  She was given technetium in the standard periareolar fashion.  She had 2 seeds placed prior to this and I had these mammograms in the operating room.  She was placed under general anesthesia.  She received antibiotics.  SCDs were in place.  She was prepped and draped in the standard sterile surgical fashion.  Surgical timeout was then performed.  I first did the sentinel lymph node biopsy.  I identified the location and made a curvilinear incision below the axillary hairline.  I then carried this through the axillary fascia.  There were several sentinel lymph nodes that were visualized and removed.  The background radioactivity was 0.  I placed some Arista in this cavity.  I then closed the deep layers with 2-0 Vicryl.  The skin was closed with 3-0 Vicryl and 4-0 Monocryl.  I applied glue and  Steri-Strips.  I then located the seeds in the upper outer quadrant.  I elected to make a curvilinear incision overlying both of these as they were in the anterior posterior direction.  I then used the neoprobe to guide the excision of both seeds and the surrounding tissue with an attempt to get a clear margin.  This is a fairly large area of tissue.  Mammogram confirmed removal of both clips and both seeds.  Hemostasis was then obtained.  I did enter into my sentinel node cavity due to the location.  I closed this completely down with multiple 2-0 Vicryl sutures.  I then placed clips in my cavity.  I then closed all of the breast tissue with 2-0 Vicryl.  I used a layered closure with a 2-0 Vicryl, 3-0 Vicryl, and 4-0 Monocryl to re-create the defect.  I then used glue and Steri-Strips.  She tolerated this well was extubated and transferred to recovery stable.

## 2018-03-09 NOTE — H&P (View-Only) (Signed)
47 yof seen for second opinion from new right breast cancer. she has been seen for breast pain in past but evaluation was negative. she notes that she thinks this area has been present for a couple years but also states she has same thing on left side and in upper left arm. I previously took care of her duaghter Patrice in 2010. mm shows c density breasts. there was ruoq irregular distortion and a posterio amd middle depth mass. these measured 1.8 and 1.7 cm on [place]. [place] axilla was negative. biopsy of both are invasive lobular carcinoma grade I-II, er pos, pr pos, her2 negative and Ki is 1%. she then had mri that showed normal nodes. on the left side there are multiple nodules measuring 3.1x6.7x4.3 cm that are biopsied and are benign breast tissue with fcc, sclerosing adenosis. the right side shows an area of 2.2x9.3x1.9 cm in size in the right upper outer quadrant. it appears the two masses are at either end of this. I do not have films or clip films today. she has negative genetic testing. she has been seen already and wanted second opinion.    Past Surgical History Rolm Bookbinder, MD; 02/17/2018 6:51 PM) Breast Biopsy  Right. Hysterectomy (not due to cancer) - Partial  Oral Surgery   Diagnostic Studies History Rolm Bookbinder, MD; 02/17/2018 6:51 PM) Colonoscopy  1-5 years ago Mammogram  within last year Pap Smear  1-5 years ago  Allergies Rolm Bookbinder, MD; 02/17/2018 6:51 PM) Sulfadimethoxine *CHEMICALS*  Rash. Sulfa Antibiotics Adhesive Tape 1/2"x10yd *MEDICAL DEVICES AND SUPPLIES*  Latex Exam Gloves *MEDICAL DEVICES AND SUPPLIES*  Not specified Hydrogen Peroxide *Antiseptics & Disinfectants**  Neomycin Sulfate *Aminoglycosides**  Statins Support *DIETARY PRODUCTS/DIETARY MANAGEMENT PRODUCTS*   Medication History Rolm Bookbinder, MD; 02/17/2018 6:51 PM) Medications Reconciled Calcium Hydroxide Active. ZyrTEC (10MG Tablet, Oral) Active. Coenzyme  Q10 (10MG Capsule, Oral) Active. NexIUM (40MG Packet, Oral) Active. Iron Combinations (Oral) Active. Synthroid (125MCG Tablet, Oral) Active. Multiple Vitamin (Oral) Active. Probiotic (Oral) Active. Avapro (300MG Tablet, Oral) Active.  Social History Rolm Bookbinder, MD; 02/17/2018 6:51 PM) Alcohol use  Occasional alcohol use. Caffeine use  Carbonated beverages, Tea. No drug use  Tobacco use  Former smoker.  Family History Rolm Bookbinder, MD; 02/17/2018 6:51 PM) Arthritis  Father. Hypertension  Father, Mother. Malignant Neoplasm Of Pancreas  Mother. Respiratory Condition  Father. Thyroid problems  Family Members In General.  Vitals Lars Mage Spillers CMA; 02/17/2018 3:42 PM) 02/17/2018 3:41 PM Height: 62in BP: 132/80 (Sitting, Left Arm, Standard)  Physical Exam Rolm Bookbinder MD; 02/17/2018 6:51 PM) General Mental Status-Alert.  Eye Sclera/Conjunctiva - Bilateral-No scleral icterus.  Chest and Lung Exam Chest and lung exam reveals -quiet, even and easy respiratory effort with no use of accessory muscles and on auscultation, normal breath sounds, no adventitious sounds and normal vocal resonance.  Breast Nipples-No Discharge. Breast Lump-No Palpable Breast Mass.  Cardiovascular Cardiovascular examination reveals -normal heart sounds, regular rate and rhythm with no murmurs.  Abdomen Note: soft nt   Neurologic Neurologic evaluation reveals -alert and oriented x 3 with no impairment of recent or remote memory.  Neuropsychiatric Mental status exam performed with findings of-Oriented X3 with appropriate mood and affect.  Lymphatic Head & Neck  General Head & Neck Lymphatics: Bilateral - Description - Normal. Axillary  General Axillary Region: Bilateral - Description - Normal. Note: no [place] adenopathy     Assessment & Plan Rolm Bookbinder MD; 02/17/2018 7:43 PM) MALIGNANT NEOPLASM OF UPPER-OUTER QUADRANT OF RIGHT  BREAST IN FEMALE,  ESTROGEN RECEPTOR POSITIVE (C50.411) Story: Right breast seed bracketed lumpectomy, right ax sn biopsy I will discuss left side with benign biopsy for mr finding with radiology as well as seed placement We discussed a sentinel lymph node biopsy as she does not appear to having lymph node involvement right now. We discussed the performance of that with injection of radioactive tracer. We discussed that there is a chance of having a positive node with a sentinel lymph node biopsy and we will await the permanent pathology to make any other first further decisions in terms of her treatment.We discussed up to a 5% risk lifetime of chronic shoulder pain as well as lymphedema associated with a sentinel lymph node biopsy. We discussed the options for treatment of the breast cancer which included lumpectomy versus a mastectomy. We discussed the performance of the lumpectomy with radioactive seeds bracketing. We discussed up to 20% chance of a positive margin requiring reexcision in the operating room given her disease and size of this area We also discussed that she will need radiation therapy if she undergoes lumpectomy. We discussed the mastectomy (removal of whole breast) and the postoperative care for that as well. Mastectomy can be followed by reconstruction. The decision for lumpectomy vs mastectomy has no impact on decision for chemotherapy. We discussed that there is no difference in her survival whether she undergoes lumpectomy with radiation therapy or antiestrogen therapy versus a mastectomy. There is also no real difference between her recurrence in the breast. We discussed the risks of operation including bleeding, infection, possible reoperation. She understands her further therapy will be based on what her stage is at the time of her operation.

## 2018-03-09 NOTE — Anesthesia Preprocedure Evaluation (Signed)
Anesthesia Evaluation  Patient identified by MRN, date of birth, ID band Patient awake    Reviewed: Allergy & Precautions, NPO status , Patient's Chart, lab work & pertinent test results  Airway Mallampati: II  TM Distance: >3 FB Neck ROM: Full    Dental no notable dental hx.    Pulmonary neg pulmonary ROS, asthma , former smoker,    Pulmonary exam normal breath sounds clear to auscultation       Cardiovascular hypertension, negative cardio ROS Normal cardiovascular exam Rhythm:Regular Rate:Normal     Neuro/Psych negative neurological ROS  negative psych ROS   GI/Hepatic negative GI ROS, Neg liver ROS, GERD  ,  Endo/Other  negative endocrine ROSMorbid obesity  Renal/GU negative Renal ROS  negative genitourinary   Musculoskeletal negative musculoskeletal ROS (+)   Abdominal (+) + obese,   Peds negative pediatric ROS (+)  Hematology negative hematology ROS (+)   Anesthesia Other Findings Breast Cancer  Reproductive/Obstetrics negative OB ROS                             Anesthesia Physical Anesthesia Plan  ASA: III  Anesthesia Plan: General   Post-op Pain Management:  Regional for Post-op pain   Induction: Intravenous  PONV Risk Score and Plan: 3 and Ondansetron, Dexamethasone and Midazolam  Airway Management Planned: LMA and Oral ETT  Additional Equipment:   Intra-op Plan:   Post-operative Plan: Extubation in OR  Informed Consent: I have reviewed the patients History and Physical, chart, labs and discussed the procedure including the risks, benefits and alternatives for the proposed anesthesia with the patient or authorized representative who has indicated his/her understanding and acceptance.   Dental advisory given  Plan Discussed with: CRNA  Anesthesia Plan Comments:         Anesthesia Quick Evaluation

## 2018-03-09 NOTE — Transfer of Care (Signed)
Immediate Anesthesia Transfer of Care Note  Patient: Lisa Horton  Procedure(s) Performed: RIGHT BREAST LUMPECTOMY WITH BRACKETED RADIOACTIVE SEED AND SENTINEL LYMPH NODE BIOPSY (Right Breast)  Patient Location: PACU  Anesthesia Type:GA combined with regional for post-op pain  Level of Consciousness: awake  Airway & Oxygen Therapy: Patient Spontanous Breathing and Patient connected to face mask oxygen  Post-op Assessment: Report given to RN and Post -op Vital signs reviewed and stable  Post vital signs: Reviewed and stable  Last Vitals:  Vitals Value Taken Time  BP 157/91 03/09/2018 12:46 PM  Temp    Pulse 98 03/09/2018 12:49 PM  Resp 20 03/09/2018 12:49 PM  SpO2 100 % 03/09/2018 12:49 PM  Vitals shown include unvalidated device data.  Last Pain:  Vitals:   03/09/18 0959  TempSrc: Oral  PainSc: 0-No pain         Complications: No apparent anesthesia complications

## 2018-03-09 NOTE — Discharge Instructions (Signed)
Post Anesthesia Home Care Instructions  Activity: Get plenty of rest for the remainder of the day. A responsible individual must stay with you for 24 hours following the procedure.  For the next 24 hours, DO NOT: -Drive a car -Paediatric nurse -Drink alcoholic beverages -Take any medication unless instructed by your physician -Make any legal decisions or sign important papers.  Meals: Start with liquid foods such as gelatin or soup. Progress to regular foods as tolerated. Avoid greasy, spicy, heavy foods. If nausea and/or vomiting occur, drink only clear liquids until the nausea and/or vomiting subsides. Call your physician if vomiting continues.  Special Instructions/Symptoms: Your throat may feel dry or sore from the anesthesia or the breathing tube placed in your throat during surgery. If this causes discomfort, gargle with warm salt water. The discomfort should disappear within 24 hours.  If you had a scopolamine patch placed behind your ear for the management of post- operative nausea and/or vomiting:  1. The medication in the patch is effective for 72 hours, after which it should be removed.  Wrap patch in a tissue and discard in the trash. Wash hands thoroughly with soap and water. 2. You may remove the patch earlier than 72 hours if you experience unpleasant side effects which may include dry mouth, dizziness or visual disturbances. 3. Avoid touching the patch. Wash your hands with soap and water after contact with the patch.      [facility] Phone Number [phone] POST OP INSTRUCTIONS Take tylenol 650 mg every six hours or ibuprofen 400 mg every 8 hours for next 48 hours then as needed. Apply ice today and daily for next 72 hours.  Always review your discharge instruction sheet given to you by the facility where your surgery was performed.  IF YOU HAVE DISABILITY OR FAMILY LEAVE FORMS, YOU MUST BRING THEM TO THE OFFICE FOR PROCESSING.  DO NOT  GIVE THEM TO YOUR DOCTOR.  1. A prescription for pain medication may be given to you upon discharge.  Take your pain medication as prescribed, if needed.  If narcotic pain medicine is not needed, then you may take acetaminophen (Tylenol), naprosyn (Alleve) or ibuprofen (Advil) as needed. 2. Take your usually prescribed medications unless otherwise directed 3. If you need a refill on your pain medication, please contact your pharmacy.  They will contact our office to request authorization.  Prescriptions will not be filled after 5pm or on week-ends. 4. You should eat very light the first 24 hours after surgery, such as soup, crackers, pudding, etc.  Resume your normal diet the day after surgery. 5. Most patients will experience some swelling and bruising in the breast.  Ice packs and a good support bra will help.  Wear the breast binder provided or a sports bra for 72 hours day and night.  After that wear a sports bra during the day until you return to the office. Swelling and bruising can take several days to resolve.  6. It is common to experience some constipation if taking pain medication after surgery.  Increasing fluid intake and taking a stool softener will usually help or prevent this problem from occurring.  A mild laxative (Milk of Magnesia or Miralax) should be taken according to package directions if there are no bowel movements after 48 hours. 7. Unless discharge instructions indicate otherwise, you may remove your bandages 48 hours after surgery and you may shower at that time.  You may have steri-strips (small skin tapes) in place directly  over the incision.  These strips should be left on the skin for 7-10 days and will come off on their own.  If your surgeon used skin glue on the incision, you may shower in 24 hours.  The glue will flake off over the next 2-3 weeks.  Any sutures or staples will be removed at the office during your follow-up visit. 8. ACTIVITIES:  You may resume regular daily  activities (gradually increasing) beginning the next day.  Wearing a good support bra or sports bra minimizes pain and swelling.  You may have sexual intercourse when it is comfortable. a. You may drive when you no longer are taking prescription pain medication, you can comfortably wear a seatbelt, and you can safely maneuver your car and apply brakes. b. RETURN TO WORK:  ______________________________________________________________________________________ 9. You should see your doctor in the office for a follow-up appointment approximately two weeks after your surgery.  Your doctors nurse will typically make your follow-up appointment when she calls you with your pathology report.  Expect your pathology report 3-4 business days after your surgery.  You may call to check if you do not hear from [place] after three days. 10. OTHER INSTRUCTIONS: _______________________________________________________________________________________________ _____________________________________________________________________________________________________________________________________ _____________________________________________________________________________________________________________________________________ _____________________________________________________________________________________________________________________________________  WHEN TO CALL DR WAKEFIELD: 1. Fever over 101.0 2. Nausea and/or vomiting. 3. Extreme swelling or bruising. 4. Continued bleeding from incision. 5. Increased pain, redness, or drainage from the incision.  The clinic staff is available to answer your questions during regular business hours.  Please dont hesitate to call and ask to speak to one of the nurses for clinical concerns.  If you have a medical emergency, go to the nearest emergency room or call 911.  A surgeon from [facility] is always on call at the hospital.  For further questions, please visit  [email or web address]

## 2018-03-09 NOTE — Anesthesia Procedure Notes (Signed)
Procedure Name: LMA Insertion Date/Time: 03/09/2018 11:18 AM Performed by: Marrianne Mood, CRNA Pre-anesthesia Checklist: Patient identified, Emergency Drugs available, Suction available, Patient being monitored and Timeout performed Patient Re-evaluated:Patient Re-evaluated prior to induction Oxygen Delivery Method: Circle system utilized Preoxygenation: Pre-oxygenation with 100% oxygen Induction Type: IV induction Ventilation: Mask ventilation without difficulty LMA: LMA inserted LMA Size: 4.0 Number of attempts: 1 Airway Equipment and Method: Bite block Placement Confirmation: positive ETCO2 Tube secured with: Tape Dental Injury: Teeth and Oropharynx as per pre-operative assessment

## 2018-03-09 NOTE — Anesthesia Postprocedure Evaluation (Signed)
Anesthesia Post Note  Patient: Lisa Horton  Procedure(s) Performed: RIGHT BREAST LUMPECTOMY WITH BRACKETED RADIOACTIVE SEED AND SENTINEL LYMPH NODE BIOPSY (Right Breast)     Patient location during evaluation: PACU Anesthesia Type: General Level of consciousness: awake and alert Pain management: pain level controlled Vital Signs Assessment: post-procedure vital signs reviewed and stable Respiratory status: spontaneous breathing, nonlabored ventilation and respiratory function stable Cardiovascular status: blood pressure returned to baseline and stable Postop Assessment: no apparent nausea or vomiting Anesthetic complications: no    Last Vitals:  Vitals:   03/09/18 1345 03/09/18 1415  BP: (!) 130/96 (!) 160/80  Pulse: 96 95  Resp: 17 18  Temp:  36.7 C  SpO2: 95% 95%    Last Pain:  Vitals:   03/09/18 1415  TempSrc:   PainSc: 0-No pain                 Lynda Rainwater

## 2018-03-09 NOTE — Progress Notes (Signed)
Assisted Dr. Miller with right, ultrasound guided, pectoralis block. Side rails up, monitors on throughout procedure. See vital signs in flow sheet. Tolerated Procedure well. 

## 2018-03-09 NOTE — H&P (Signed)
76 yof seen for second opinion from new right breast cancer. she has been seen for breast pain in past but evaluation was negative. she notes that she thinks this area has been present for a couple years but also states she has same thing on left side and in upper left arm. I previously took care of her duaghter Patrice in 2010. mm shows c density breasts. there was ruoq irregular distortion and a posterio amd middle depth mass. these measured 1.8 and 1.7 cm on [place]. [place] axilla was negative. biopsy of both are invasive lobular carcinoma grade I-II, er pos, pr pos, her2 negative and Ki is 1%. she then had mri that showed normal nodes. on the left side there are multiple nodules measuring 3.1x6.7x4.3 cm that are biopsied and are benign breast tissue with fcc, sclerosing adenosis. the right side shows an area of 2.2x9.3x1.9 cm in size in the right upper outer quadrant. it appears the two masses are at either end of this. I do not have films or clip films today. she has negative genetic testing. she has been seen already and wanted second opinion.    Past Surgical History Rolm Bookbinder, MD; 02/17/2018 6:51 PM) Breast Biopsy  Right. Hysterectomy (not due to cancer) - Partial  Oral Surgery   Diagnostic Studies History Rolm Bookbinder, MD; 02/17/2018 6:51 PM) Colonoscopy  1-5 years ago Mammogram  within last year Pap Smear  1-5 years ago  Allergies Rolm Bookbinder, MD; 02/17/2018 6:51 PM) Sulfadimethoxine *CHEMICALS*  Rash. Sulfa Antibiotics Adhesive Tape 1/2"x10yd *MEDICAL DEVICES AND SUPPLIES*  Latex Exam Gloves *MEDICAL DEVICES AND SUPPLIES*  Not specified Hydrogen Peroxide *Antiseptics & Disinfectants**  Neomycin Sulfate *Aminoglycosides**  Statins Support *DIETARY PRODUCTS/DIETARY MANAGEMENT PRODUCTS*   Medication History Rolm Bookbinder, MD; 02/17/2018 6:51 PM) Medications Reconciled Calcium Hydroxide Active. ZyrTEC (10MG Tablet, Oral) Active. Coenzyme  Q10 (10MG Capsule, Oral) Active. NexIUM (40MG Packet, Oral) Active. Iron Combinations (Oral) Active. Synthroid (125MCG Tablet, Oral) Active. Multiple Vitamin (Oral) Active. Probiotic (Oral) Active. Avapro (300MG Tablet, Oral) Active.  Social History Rolm Bookbinder, MD; 02/17/2018 6:51 PM) Alcohol use  Occasional alcohol use. Caffeine use  Carbonated beverages, Tea. No drug use  Tobacco use  Former smoker.  Family History Rolm Bookbinder, MD; 02/17/2018 6:51 PM) Arthritis  Father. Hypertension  Father, Mother. Malignant Neoplasm Of Pancreas  Mother. Respiratory Condition  Father. Thyroid problems  Family Members In General.  Vitals Lars Mage Spillers CMA; 02/17/2018 3:42 PM) 02/17/2018 3:41 PM Height: 62in BP: 132/80 (Sitting, Left Arm, Standard)  Physical Exam Rolm Bookbinder MD; 02/17/2018 6:51 PM) General Mental Status-Alert.  Eye Sclera/Conjunctiva - Bilateral-No scleral icterus.  Chest and Lung Exam Chest and lung exam reveals -quiet, even and easy respiratory effort with no use of accessory muscles and on auscultation, normal breath sounds, no adventitious sounds and normal vocal resonance.  Breast Nipples-No Discharge. Breast Lump-No Palpable Breast Mass.  Cardiovascular Cardiovascular examination reveals -normal heart sounds, regular rate and rhythm with no murmurs.  Abdomen Note: soft nt   Neurologic Neurologic evaluation reveals -alert and oriented x 3 with no impairment of recent or remote memory.  Neuropsychiatric Mental status exam performed with findings of-Oriented X3 with appropriate mood and affect.  Lymphatic Head & Neck  General Head & Neck Lymphatics: Bilateral - Description - Normal. Axillary  General Axillary Region: Bilateral - Description - Normal. Note: no [place] adenopathy     Assessment & Plan Rolm Bookbinder MD; 02/17/2018 7:43 PM) MALIGNANT NEOPLASM OF UPPER-OUTER QUADRANT OF RIGHT  BREAST IN FEMALE,  ESTROGEN RECEPTOR POSITIVE (C50.411) Story: Right breast seed bracketed lumpectomy, right ax sn biopsy I will discuss left side with benign biopsy for mr finding with radiology as well as seed placement We discussed a sentinel lymph node biopsy as she does not appear to having lymph node involvement right now. We discussed the performance of that with injection of radioactive tracer. We discussed that there is a chance of having a positive node with a sentinel lymph node biopsy and we will await the permanent pathology to make any other first further decisions in terms of her treatment.We discussed up to a 5% risk lifetime of chronic shoulder pain as well as lymphedema associated with a sentinel lymph node biopsy. We discussed the options for treatment of the breast cancer which included lumpectomy versus a mastectomy. We discussed the performance of the lumpectomy with radioactive seeds bracketing. We discussed up to 20% chance of a positive margin requiring reexcision in the operating room given her disease and size of this area We also discussed that she will need radiation therapy if she undergoes lumpectomy. We discussed the mastectomy (removal of whole breast) and the postoperative care for that as well. Mastectomy can be followed by reconstruction. The decision for lumpectomy vs mastectomy has no impact on decision for chemotherapy. We discussed that there is no difference in her survival whether she undergoes lumpectomy with radiation therapy or antiestrogen therapy versus a mastectomy. There is also no real difference between her recurrence in the breast. We discussed the risks of operation including bleeding, infection, possible reoperation. She understands her further therapy will be based on what her stage is at the time of her operation.

## 2018-03-09 NOTE — Anesthesia Procedure Notes (Signed)
Anesthesia Regional Block: Pectoralis block   Pre-Anesthetic Checklist: ,, timeout performed, Correct Patient, Correct Site, Correct Laterality, Correct Procedure, Correct Position, site marked, Risks and benefits discussed,  Surgical consent,  Pre-op evaluation,  At surgeon's request and post-op pain management  Laterality: Right  Prep: chloraprep       Needles:  Injection technique: Single-shot  Needle Type: Stimiplex     Needle Length: 9cm  Needle Gauge: 22     Additional Needles:   Procedures:,,,, ultrasound used (permanent image in chart),,,,  Narrative:  Start time: 03/09/2018 10:38 AM End time: 03/09/2018 10:43 AM Injection made incrementally with aspirations every 5 mL.  Performed by: Personally  Anesthesiologist: Lynda Rainwater, MD

## 2018-03-10 ENCOUNTER — Encounter (HOSPITAL_BASED_OUTPATIENT_CLINIC_OR_DEPARTMENT_OTHER): Payer: Self-pay | Admitting: General Surgery

## 2018-03-14 ENCOUNTER — Telehealth: Payer: Self-pay | Admitting: *Deleted

## 2018-03-14 ENCOUNTER — Other Ambulatory Visit: Payer: Self-pay | Admitting: General Surgery

## 2018-03-14 NOTE — Telephone Encounter (Signed)
Ordered oncotype per Dr. Burr Medico.  Requisition sent to pathology and confirmed receipt.

## 2018-03-16 ENCOUNTER — Encounter (HOSPITAL_BASED_OUTPATIENT_CLINIC_OR_DEPARTMENT_OTHER): Payer: Self-pay | Admitting: *Deleted

## 2018-03-16 ENCOUNTER — Other Ambulatory Visit: Payer: Self-pay

## 2018-03-25 ENCOUNTER — Encounter (HOSPITAL_COMMUNITY): Payer: Self-pay | Admitting: Hematology

## 2018-03-25 ENCOUNTER — Telehealth: Payer: Self-pay | Admitting: *Deleted

## 2018-03-25 DIAGNOSIS — C50411 Malignant neoplasm of upper-outer quadrant of right female breast: Secondary | ICD-10-CM

## 2018-03-25 DIAGNOSIS — Z170 Estrogen receptor positive status [ER+]: Principal | ICD-10-CM

## 2018-03-25 NOTE — Telephone Encounter (Signed)
Received oncotype score of 17/5%. Physician team notified. Called pt with results and discussed chemotherapy not recommended and next step is re-excision followed by xrt with Dr. Sondra Come. Received verbal understanding. Denies further needs or questions at this time.

## 2018-03-28 ENCOUNTER — Encounter: Payer: Self-pay | Admitting: Radiation Oncology

## 2018-03-29 NOTE — Progress Notes (Signed)
Pt by to pick up pre-surgery Ensure, will consume by 0745 day of surgery

## 2018-03-31 ENCOUNTER — Ambulatory Visit (HOSPITAL_BASED_OUTPATIENT_CLINIC_OR_DEPARTMENT_OTHER)
Admission: RE | Admit: 2018-03-31 | Discharge: 2018-03-31 | Disposition: A | Discharge status: Home / Self Care | Payer: Medicare Other | Source: Ambulatory Visit | Attending: General Surgery | Admitting: General Surgery

## 2018-03-31 ENCOUNTER — Other Ambulatory Visit: Payer: Self-pay

## 2018-03-31 ENCOUNTER — Encounter (HOSPITAL_BASED_OUTPATIENT_CLINIC_OR_DEPARTMENT_OTHER): Payer: Self-pay | Admitting: Certified Registered Nurse Anesthetist

## 2018-03-31 ENCOUNTER — Encounter (HOSPITAL_BASED_OUTPATIENT_CLINIC_OR_DEPARTMENT_OTHER)
Admission: RE | Disposition: A | Discharge status: Home / Self Care | Payer: Self-pay | Source: Ambulatory Visit | Attending: General Surgery

## 2018-03-31 ENCOUNTER — Ambulatory Visit (HOSPITAL_BASED_OUTPATIENT_CLINIC_OR_DEPARTMENT_OTHER): Payer: Medicare Other | Admitting: Certified Registered Nurse Anesthetist

## 2018-03-31 DIAGNOSIS — N6031 Fibrosclerosis of right breast: Secondary | ICD-10-CM | POA: Diagnosis not present

## 2018-03-31 DIAGNOSIS — Z87891 Personal history of nicotine dependence: Secondary | ICD-10-CM | POA: Diagnosis not present

## 2018-03-31 DIAGNOSIS — Z888 Allergy status to other drugs, medicaments and biological substances status: Secondary | ICD-10-CM | POA: Insufficient documentation

## 2018-03-31 DIAGNOSIS — Z882 Allergy status to sulfonamides status: Secondary | ICD-10-CM | POA: Diagnosis not present

## 2018-03-31 DIAGNOSIS — Z881 Allergy status to other antibiotic agents status: Secondary | ICD-10-CM | POA: Diagnosis not present

## 2018-03-31 DIAGNOSIS — Z800 Family history of malignant neoplasm of digestive organs: Secondary | ICD-10-CM | POA: Diagnosis not present

## 2018-03-31 DIAGNOSIS — Z8249 Family history of ischemic heart disease and other diseases of the circulatory system: Secondary | ICD-10-CM | POA: Insufficient documentation

## 2018-03-31 DIAGNOSIS — C50911 Malignant neoplasm of unspecified site of right female breast: Secondary | ICD-10-CM | POA: Diagnosis present

## 2018-03-31 DIAGNOSIS — Z79890 Hormone replacement therapy (postmenopausal): Secondary | ICD-10-CM | POA: Diagnosis not present

## 2018-03-31 DIAGNOSIS — Z90710 Acquired absence of both cervix and uterus: Secondary | ICD-10-CM | POA: Insufficient documentation

## 2018-03-31 DIAGNOSIS — Z91040 Latex allergy status: Secondary | ICD-10-CM | POA: Diagnosis not present

## 2018-03-31 DIAGNOSIS — Z6841 Body Mass Index (BMI) 40.0 and over, adult: Secondary | ICD-10-CM | POA: Diagnosis not present

## 2018-03-31 DIAGNOSIS — Z79899 Other long term (current) drug therapy: Secondary | ICD-10-CM | POA: Diagnosis not present

## 2018-03-31 DIAGNOSIS — I10 Essential (primary) hypertension: Secondary | ICD-10-CM | POA: Diagnosis not present

## 2018-03-31 HISTORY — PX: RE-EXCISION OF BREAST LUMPECTOMY: SHX6048

## 2018-03-31 SURGERY — EXCISION, LESION, BREAST
Anesthesia: General | Site: Breast | Laterality: Right

## 2018-03-31 MED ORDER — MIDAZOLAM HCL 2 MG/2ML IJ SOLN
INTRAMUSCULAR | Status: AC
  Filled 2018-03-31: qty 2

## 2018-03-31 MED ORDER — TRAMADOL HCL 50 MG PO TABS: 100.0000 mg | ORAL_TABLET | Freq: Four times a day (QID) | ORAL | 0 refills | Status: DC | PRN

## 2018-03-31 MED ORDER — PHENYLEPHRINE 40 MCG/ML (10ML) SYRINGE FOR IV PUSH (FOR BLOOD PRESSURE SUPPORT)
PREFILLED_SYRINGE | INTRAVENOUS | Status: DC | PRN
  Administered 2018-03-31: 120 ug via INTRAVENOUS
  Administered 2018-03-31: 80 ug via INTRAVENOUS
  Administered 2018-03-31: 120 ug via INTRAVENOUS

## 2018-03-31 MED ORDER — MIDAZOLAM HCL 2 MG/2ML IJ SOLN: 1.0000 mg | INTRAMUSCULAR | Status: DC | PRN

## 2018-03-31 MED ORDER — GABAPENTIN 100 MG PO CAPS
ORAL_CAPSULE | ORAL | Status: AC
  Filled 2018-03-31: qty 1

## 2018-03-31 MED ORDER — PHENYLEPHRINE 40 MCG/ML (10ML) SYRINGE FOR IV PUSH (FOR BLOOD PRESSURE SUPPORT)
PREFILLED_SYRINGE | INTRAVENOUS | Status: AC
  Filled 2018-03-31: qty 10

## 2018-03-31 MED ORDER — OXYCODONE HCL 5 MG/5ML PO SOLN: 5.0000 mg | Freq: Once | ORAL | Status: DC | PRN

## 2018-03-31 MED ORDER — MIDAZOLAM HCL 5 MG/5ML IJ SOLN
INTRAMUSCULAR | Status: DC | PRN
  Administered 2018-03-31: 2 mg via INTRAVENOUS

## 2018-03-31 MED ORDER — MEPERIDINE HCL 25 MG/ML IJ SOLN: 6.2500 mg | INTRAMUSCULAR | Status: DC | PRN

## 2018-03-31 MED ORDER — ONDANSETRON HCL 4 MG/2ML IJ SOLN
INTRAMUSCULAR | Status: DC | PRN
  Administered 2018-03-31: 4 mg via INTRAVENOUS

## 2018-03-31 MED ORDER — OXYCODONE HCL 5 MG PO TABS: 5.0000 mg | ORAL_TABLET | Freq: Once | ORAL | Status: DC | PRN

## 2018-03-31 MED ORDER — SCOPOLAMINE 1 MG/3DAYS TD PT72: 1.0000 | MEDICATED_PATCH | Freq: Once | TRANSDERMAL | Status: DC | PRN

## 2018-03-31 MED ORDER — CEFAZOLIN SODIUM-DEXTROSE 2-4 GM/100ML-% IV SOLN
INTRAVENOUS | Status: AC
  Filled 2018-03-31: qty 100

## 2018-03-31 MED ORDER — CEFAZOLIN SODIUM-DEXTROSE 2-4 GM/100ML-% IV SOLN
2.0000 g | INTRAVENOUS | Status: AC
  Administered 2018-03-31: 2 g via INTRAVENOUS

## 2018-03-31 MED ORDER — LACTATED RINGERS IV SOLN
INTRAVENOUS | Status: DC
  Administered 2018-03-31: 10:00:00 via INTRAVENOUS

## 2018-03-31 MED ORDER — FENTANYL CITRATE (PF) 100 MCG/2ML IJ SOLN: 50.0000 ug | INTRAMUSCULAR | Status: DC | PRN

## 2018-03-31 MED ORDER — ENSURE PRE-SURGERY PO LIQD: 296.0000 mL | Freq: Once | ORAL | Status: DC

## 2018-03-31 MED ORDER — FENTANYL CITRATE (PF) 100 MCG/2ML IJ SOLN
INTRAMUSCULAR | Status: DC | PRN
  Administered 2018-03-31 (×2): 25 ug via INTRAVENOUS

## 2018-03-31 MED ORDER — LIDOCAINE 2% (20 MG/ML) 5 ML SYRINGE
INTRAMUSCULAR | Status: AC
  Filled 2018-03-31: qty 5

## 2018-03-31 MED ORDER — BUPIVACAINE HCL (PF) 0.25 % IJ SOLN
INTRAMUSCULAR | Status: DC | PRN
  Administered 2018-03-31: 20 mL

## 2018-03-31 MED ORDER — ACETAMINOPHEN 500 MG PO TABS
1000.0000 mg | ORAL_TABLET | ORAL | Status: AC
  Administered 2018-03-31: 1000 mg via ORAL

## 2018-03-31 MED ORDER — DEXAMETHASONE SODIUM PHOSPHATE 10 MG/ML IJ SOLN
INTRAMUSCULAR | Status: AC
  Filled 2018-03-31: qty 1

## 2018-03-31 MED ORDER — ONDANSETRON HCL 4 MG/2ML IJ SOLN
INTRAMUSCULAR | Status: AC
  Filled 2018-03-31: qty 2

## 2018-03-31 MED ORDER — LIDOCAINE 2% (20 MG/ML) 5 ML SYRINGE
INTRAMUSCULAR | Status: DC | PRN
  Administered 2018-03-31: 80 mg via INTRAVENOUS

## 2018-03-31 MED ORDER — DEXAMETHASONE SODIUM PHOSPHATE 10 MG/ML IJ SOLN
INTRAMUSCULAR | Status: DC | PRN
  Administered 2018-03-31: 10 mg via INTRAVENOUS

## 2018-03-31 MED ORDER — HYDROMORPHONE HCL 1 MG/ML IJ SOLN: 0.2500 mg | INTRAMUSCULAR | Status: DC | PRN

## 2018-03-31 MED ORDER — PROPOFOL 10 MG/ML IV BOLUS
INTRAVENOUS | Status: AC
  Filled 2018-03-31: qty 20

## 2018-03-31 MED ORDER — ACETAMINOPHEN 500 MG PO TABS
ORAL_TABLET | ORAL | Status: AC
  Filled 2018-03-31: qty 2

## 2018-03-31 MED ORDER — PROMETHAZINE HCL 25 MG/ML IJ SOLN: 6.2500 mg | INTRAMUSCULAR | Status: DC | PRN

## 2018-03-31 MED ORDER — GABAPENTIN 100 MG PO CAPS
100.0000 mg | ORAL_CAPSULE | ORAL | Status: AC
  Administered 2018-03-31: 100 mg via ORAL

## 2018-03-31 MED ORDER — PROPOFOL 10 MG/ML IV BOLUS
INTRAVENOUS | Status: DC | PRN
  Administered 2018-03-31: 150 mg via INTRAVENOUS

## 2018-03-31 MED ORDER — FENTANYL CITRATE (PF) 100 MCG/2ML IJ SOLN
INTRAMUSCULAR | Status: AC
  Filled 2018-03-31: qty 2

## 2018-03-31 SURGICAL SUPPLY — 56 items
BINDER BREAST LRG (GAUZE/BANDAGES/DRESSINGS) IMPLANT
BINDER BREAST MEDIUM (GAUZE/BANDAGES/DRESSINGS) IMPLANT
BINDER BREAST XLRG (GAUZE/BANDAGES/DRESSINGS) IMPLANT
BINDER BREAST XXLRG (GAUZE/BANDAGES/DRESSINGS) IMPLANT
BLADE SURG 15 STRL LF DISP TIS (BLADE) ×1 IMPLANT
BLADE SURG 15 STRL SS (BLADE) ×1
CANISTER SUCT 1200ML W/VALVE (MISCELLANEOUS) ×2 IMPLANT
CHLORAPREP W/TINT 26ML (MISCELLANEOUS) ×2 IMPLANT
CLIP VESOCCLUDE SM WIDE 6/CT (CLIP) IMPLANT
COVER BACK TABLE 60X90IN (DRAPES) ×2 IMPLANT
COVER MAYO STAND STRL (DRAPES) ×2 IMPLANT
DECANTER SPIKE VIAL GLASS SM (MISCELLANEOUS) IMPLANT
DERMABOND ADVANCED (GAUZE/BANDAGES/DRESSINGS) ×1
DERMABOND ADVANCED .7 DNX12 (GAUZE/BANDAGES/DRESSINGS) ×1 IMPLANT
DRAPE LAPAROSCOPIC ABDOMINAL (DRAPES) ×2 IMPLANT
DRAPE UTILITY XL STRL (DRAPES) ×2 IMPLANT
DRSG TEGADERM 4X4.75 (GAUZE/BANDAGES/DRESSINGS) IMPLANT
ELECT COATED BLADE 2.86 ST (ELECTRODE) ×2 IMPLANT
ELECT REM PT RETURN 9FT ADLT (ELECTROSURGICAL) ×2
ELECTRODE REM PT RTRN 9FT ADLT (ELECTROSURGICAL) ×1 IMPLANT
GAUZE SPONGE 4X4 12PLY STRL LF (GAUZE/BANDAGES/DRESSINGS) IMPLANT
GLOVE BIO SURGEON STRL SZ7 (GLOVE) IMPLANT
GLOVE BIOGEL PI IND STRL 7.0 (GLOVE) ×3 IMPLANT
GLOVE BIOGEL PI IND STRL 7.5 (GLOVE) ×1 IMPLANT
GLOVE BIOGEL PI INDICATOR 7.0 (GLOVE) ×3
GLOVE BIOGEL PI INDICATOR 7.5 (GLOVE) ×1
GLOVE SURG SS PI 6.5 STRL IVOR (GLOVE) ×2 IMPLANT
GLOVE SURG SS PI 7.0 STRL IVOR (GLOVE) ×6 IMPLANT
GOWN STRL REUS W/ TWL LRG LVL3 (GOWN DISPOSABLE) ×2 IMPLANT
GOWN STRL REUS W/TWL LRG LVL3 (GOWN DISPOSABLE) ×2
HEMOSTAT ARISTA ABSORB 3G PWDR (MISCELLANEOUS) ×2 IMPLANT
ILLUMINATOR WAVEGUIDE N/F (MISCELLANEOUS) IMPLANT
KIT MARKER MARGIN INK (KITS) ×2 IMPLANT
LIGHT WAVEGUIDE WIDE FLAT (MISCELLANEOUS) IMPLANT
NEEDLE HYPO 25X1 1.5 SAFETY (NEEDLE) ×2 IMPLANT
NS IRRIG 1000ML POUR BTL (IV SOLUTION) IMPLANT
PACK BASIN DAY SURGERY FS (CUSTOM PROCEDURE TRAY) ×2 IMPLANT
PENCIL BUTTON HOLSTER BLD 10FT (ELECTRODE) ×2 IMPLANT
SLEEVE SCD COMPRESS KNEE MED (MISCELLANEOUS) ×2 IMPLANT
SPONGE LAP 4X18 RFD (DISPOSABLE) ×2 IMPLANT
STRIP CLOSURE SKIN 1/2X4 (GAUZE/BANDAGES/DRESSINGS) IMPLANT
SUT MNCRL AB 3-0 PS2 18 (SUTURE) IMPLANT
SUT MNCRL AB 4-0 PS2 18 (SUTURE) ×2 IMPLANT
SUT MON AB 5-0 PS2 18 (SUTURE) IMPLANT
SUT SILK 2 0 SH (SUTURE) IMPLANT
SUT VIC AB 2-0 SH 27 (SUTURE) ×2
SUT VIC AB 2-0 SH 27XBRD (SUTURE) ×2 IMPLANT
SUT VIC AB 3-0 SH 27 (SUTURE) ×1
SUT VIC AB 3-0 SH 27X BRD (SUTURE) ×1 IMPLANT
SUT VIC AB 5-0 PS2 18 (SUTURE) IMPLANT
SUT VICRYL AB 3 0 TIES (SUTURE) IMPLANT
SYR CONTROL 10ML LL (SYRINGE) ×2 IMPLANT
TOWEL GREEN STERILE FF (TOWEL DISPOSABLE) ×2 IMPLANT
TOWEL OR NON WOVEN STRL DISP B (DISPOSABLE) ×2 IMPLANT
TUBE CONNECTING 20X1/4 (TUBING) ×2 IMPLANT
YANKAUER SUCT BULB TIP NO VENT (SUCTIONS) ×2 IMPLANT

## 2018-03-31 NOTE — Anesthesia Postprocedure Evaluation (Signed)
Anesthesia Post Note  Patient: Lisa Horton  Procedure(s) Performed: RE-EXCISION OF BREAST LUMPECTOMY (Right Breast)     Patient location during evaluation: PACU Anesthesia Type: General Level of consciousness: awake and alert Pain management: pain level controlled Vital Signs Assessment: post-procedure vital signs reviewed and stable Respiratory status: spontaneous breathing, nonlabored ventilation and respiratory function stable Cardiovascular status: blood pressure returned to baseline and stable Postop Assessment: no apparent nausea or vomiting Anesthetic complications: no    Last Vitals:  Vitals:   03/31/18 1200 03/31/18 1230  BP: 128/67 132/71  Pulse: 66 64  Resp: 20 20  Temp:  36.8 C  SpO2: 98% 100%    Last Pain:  Vitals:   03/31/18 1230  TempSrc:   PainSc: 0-No pain                 Lynda Rainwater

## 2018-03-31 NOTE — Op Note (Signed)
Preoperative diagnosis: Right breast stage II invasive lobular cancer with positive superior margin, low Oncotype Postoperative diagnosis: Same as above Procedure: Right breast reexcision lumpectomy Surgeon: Dr. Serita Grammes Anesthesia: General Estimated blood loss: Minimal Specimens: Right breast superior margin marked with painted Drains: None Complications: None Sponge needle count was correct at completion Disposition to recovery in stable condition  Indications: This is a 72 year old female who underwent a seed bracketed lumpectomy of a right upper outer quadrant invasive lobular cancer.  Her superior margin is positive.  Her lymph nodes are negative.  Her Oncotype was 17.  I discussed going back to the operating room and re-excising the superior margin.  Procedure: After informed consent was obtained the patient was taken to the operating room.  She was given antibiotics.  SCDs were in place.  She was placed under general anesthesia without complication.  Her breast was prepped and draped in the standard sterile surgical fashion.  A surgical timeout was then performed.  I infiltrated Marcaine around her old incision and in the field.  I then reentered her old incision.  I released all my sutures and drained a small seroma.  I then identified the superior margin.  I excised this in its entirety.  I then painted this and passed this off the table.  Hemostasis was obtained.  I then closed back the cavity with 2-0 Vicryl.  The skin was closed with 3-0 Vicryl and 4-0 Monocryl.  Glue and Steri-Strips were applied.  She tolerated this well was extubated and transferred to recovery stable.

## 2018-03-31 NOTE — Anesthesia Procedure Notes (Signed)
Procedure Name: LMA Insertion Date/Time: 03/31/2018 10:32 AM Performed by: Genelle Bal, CRNA Pre-anesthesia Checklist: Patient identified, Emergency Drugs available, Suction available and Patient being monitored Patient Re-evaluated:Patient Re-evaluated prior to induction Oxygen Delivery Method: Circle system utilized Preoxygenation: Pre-oxygenation with 100% oxygen Induction Type: IV induction Ventilation: Mask ventilation without difficulty LMA: LMA inserted LMA Size: 5.0 Number of attempts: 1 Placement Confirmation: positive ETCO2 Tube secured with: Tape Dental Injury: Teeth and Oropharynx as per pre-operative assessment

## 2018-03-31 NOTE — Discharge Instructions (Signed)
[place] Office Phone Number [phone]  POST OP INSTRUCTIONS  Always review your discharge instruction sheet given to you by the facility where your surgery was performed.  IF YOU HAVE DISABILITY OR FAMILY LEAVE FORMS, YOU MUST BRING THEM TO THE OFFICE FOR PROCESSING.  DO NOT GIVE THEM TO YOUR DOCTOR.  1. A prescription for pain medication may be given to you upon discharge.  Take your pain medication as prescribed, if needed.  If narcotic pain medicine is not needed, then you may take acetaminophen (Tylenol), naprosyn (Alleve) or ibuprofen (Advil) as needed. No Tylenol until 4pm. 2. Take your usually prescribed medications unless otherwise directed 3. If you need a refill on your pain medication, please contact your pharmacy.  They will contact our office to request authorization.  Prescriptions will not be filled after 5pm or on week-ends. 4. You should eat very light the first 24 hours after surgery, such as soup, crackers, pudding, etc.  Resume your normal diet the day after surgery. 5. Most patients will experience some swelling and bruising in the breast.  Ice packs and a good support bra will help.  Wear the breast binder provided or a sports bra for 72 hours day and night.  After that wear a sports bra during the day until you return to the office. Swelling and bruising can take several days to resolve.  6. It is common to experience some constipation if taking pain medication after surgery.  Increasing fluid intake and taking a stool softener will usually help or prevent this problem from occurring.  A mild laxative (Milk of Magnesia or Miralax) should be taken according to package directions if there are no bowel movements after 48 hours. 7. Unless discharge instructions indicate otherwise, you may remove your bandages 48 hours after surgery and you may shower at that time.  You may have steri-strips (small skin tapes) in place directly over the incision.  These strips  should be left on the skin for 7-10 days and will come off on their own.  If your surgeon used skin glue on the incision, you may shower in 24 hours.  The glue will flake off over the next 2-3 weeks.  Any sutures or staples will be removed at the office during your follow-up visit. 8. ACTIVITIES:  You may resume regular daily activities (gradually increasing) beginning the next day.  Wearing a good support bra or sports bra minimizes pain and swelling.  You may have sexual intercourse when it is comfortable. a. You may drive when you no longer are taking prescription pain medication, you can comfortably wear a seatbelt, and you can safely maneuver your car and apply brakes. b. RETURN TO WORK:  ______________________________________________________________________________________ 9. You should see your doctor in the office for a follow-up appointment approximately two weeks after your surgery.  Your doctors nurse will typically make your follow-up appointment when she calls you with your pathology report.  Expect your pathology report 3-4 business days after your surgery.  You may call to check if you do not hear from [place] after three days. 10. OTHER INSTRUCTIONS: _______________________________________________________________________________________________ _____________________________________________________________________________________________________________________________________ _____________________________________________________________________________________________________________________________________ _____________________________________________________________________________________________________________________________________  WHEN TO CALL DR WAKEFIELD: 1. Fever over 101.0 2. Nausea and/or vomiting. 3. Extreme swelling or bruising. 4. Continued bleeding from incision. 5. Increased pain, redness, or drainage from the incision.  The clinic staff is available to answer your  questions during regular business hours.  Please dont hesitate to call and ask to speak to one of the nurses for  clinical concerns.  If you have a medical emergency, go to the nearest emergency room or call 911.  A surgeon from [facility] is always on call at the hospital.  For further questions, please visit [email or web address]    Post Anesthesia Home Care Instructions  Activity: Get plenty of rest for the remainder of the day. A responsible individual must stay with you for 24 hours following the procedure.  For the next 24 hours, DO NOT: -Drive a car -Paediatric nurse -Drink alcoholic beverages -Take any medication unless instructed by your physician -Make any legal decisions or sign important papers.  Meals: Start with liquid foods such as gelatin or soup. Progress to regular foods as tolerated. Avoid greasy, spicy, heavy foods. If nausea and/or vomiting occur, drink only clear liquids until the nausea and/or vomiting subsides. Call your physician if vomiting continues.  Special Instructions/Symptoms: Your throat may feel dry or sore from the anesthesia or the breathing tube placed in your throat during surgery. If this causes discomfort, gargle with warm salt water. The discomfort should disappear within 24 hours.  If you had a scopolamine patch placed behind your ear for the management of post- operative nausea and/or vomiting:  1. The medication in the patch is effective for 72 hours, after which it should be removed.  Wrap patch in a tissue and discard in the trash. Wash hands thoroughly with soap and water. 2. You may remove the patch earlier than 72 hours if you experience unpleasant side effects which may include dry mouth, dizziness or visual disturbances. 3. Avoid touching the patch. Wash your hands with soap and water after contact with the patch.

## 2018-03-31 NOTE — Interval H&P Note (Signed)
History and Physical Interval Note:  03/31/2018 10:06 AM  Lisa Horton  has presented today for surgery, with the diagnosis of RIGHT BREAST CANCER POSITIVE MARGINS  The various methods of treatment have been discussed with the patient and family. After consideration of risks, benefits and other options for treatment, the patient has consented to  Procedure(s): RE-EXCISION OF BREAST LUMPECTOMY (Right) as a surgical intervention .  The patient's history has been reviewed, patient examined, no change in status, stable for surgery.  I have reviewed the patient's chart and labs.  Questions were answered to the patient's satisfaction.     Rolm Bookbinder

## 2018-03-31 NOTE — Transfer of Care (Signed)
Immediate Anesthesia Transfer of Care Note  Patient: Lisa Horton  Procedure(s) Performed: RE-EXCISION OF BREAST LUMPECTOMY (Right Breast)  Patient Location: PACU  Anesthesia Type:General  Level of Consciousness: awake, alert  and oriented  Airway & Oxygen Therapy: Patient Spontanous Breathing and Patient connected to face mask oxygen  Post-op Assessment: Report given to RN and Post -op Vital signs reviewed and stable  Post vital signs: Reviewed and stable  Last Vitals:  Vitals Value Taken Time  BP    Temp    Pulse 70 03/31/2018 11:16 AM  Resp 15 03/31/2018 11:16 AM  SpO2 100 % 03/31/2018 11:16 AM  Vitals shown include unvalidated device data.  Last Pain:  Vitals:   03/31/18 0951  TempSrc: Oral  PainSc: 0-No pain      Patients Stated Pain Goal: 0 (22/58/34 6219)  Complications: No apparent anesthesia complications

## 2018-03-31 NOTE — Anesthesia Preprocedure Evaluation (Signed)
Anesthesia Evaluation  Patient identified by MRN, date of birth, ID band Patient awake    Reviewed: Allergy & Precautions, NPO status , Patient's Chart, lab work & pertinent test results  Airway Mallampati: II  TM Distance: >3 FB Neck ROM: Full    Dental no notable dental hx.    Pulmonary neg pulmonary ROS, asthma , former smoker,    Pulmonary exam normal breath sounds clear to auscultation       Cardiovascular hypertension, negative cardio ROS Normal cardiovascular exam Rhythm:Regular Rate:Normal     Neuro/Psych negative neurological ROS  negative psych ROS   GI/Hepatic negative GI ROS, Neg liver ROS, GERD  ,  Endo/Other  negative endocrine ROSMorbid obesity  Renal/GU negative Renal ROS  negative genitourinary   Musculoskeletal negative musculoskeletal ROS (+)   Abdominal (+) + obese,   Peds negative pediatric ROS (+)  Hematology negative hematology ROS (+)   Anesthesia Other Findings Breast Cancer  Reproductive/Obstetrics negative OB ROS                             Anesthesia Physical  Anesthesia Plan  ASA: III  Anesthesia Plan: General   Post-op Pain Management:    Induction: Intravenous  PONV Risk Score and Plan: 3 and Ondansetron, Dexamethasone and Midazolam  Airway Management Planned: LMA  Additional Equipment:   Intra-op Plan:   Post-operative Plan: Extubation in OR  Informed Consent: I have reviewed the patients History and Physical, chart, labs and discussed the procedure including the risks, benefits and alternatives for the proposed anesthesia with the patient or authorized representative who has indicated his/her understanding and acceptance.   Dental advisory given  Plan Discussed with: CRNA  Anesthesia Plan Comments:         Anesthesia Quick Evaluation

## 2018-04-01 ENCOUNTER — Encounter (HOSPITAL_BASED_OUTPATIENT_CLINIC_OR_DEPARTMENT_OTHER): Payer: Self-pay | Admitting: General Surgery

## 2018-04-25 ENCOUNTER — Ambulatory Visit: Payer: Medicare Other | Admitting: Radiation Oncology

## 2018-04-25 ENCOUNTER — Institutional Professional Consult (permissible substitution): Payer: Self-pay | Admitting: Radiation Oncology

## 2018-04-27 NOTE — Progress Notes (Signed)
Location of Breast Cancer: RIGHT breast  Histology per Pathology Report: 03/09/18:  Breast, lumpectomy, Right w/seed x2 - INVASIVE LOBULAR CARCINOMA, GRADE II/III, TWO FOCI SPANNING 2.5 CM AND 2.0 CM. - INVASIVE CARCINOMA IS BROADLY PRESENT AT THE SUPERIOR MARGIN. - SEE ONCOLOGY TABLE BELOW.  Receptor Status: ER(60%), PR (90%), Her2-neu (negative), Ki-(1%)  Did patient present with symptoms (if so, please note symptoms) or was this found on screening mammography?:  She initially presented with a gradually enlarging sore lump to right UOQ for 2 years and increasing in size over the past year. She was initially evaluated by Dr. Marlou Starks with a normal mammogram June 2018 initially. She notes that she spoke with Dr. Marlou Starks and she is interested in Dr. Donne Hazel on completing her surgery. She has had a right breast benign biopsy in 2010. She continues with yearly mammograms.   She underwent bilateral diagnostic mammography with tomography and right breast ultrasonography at [facility] on 12/28/2017 showing: The irregular architectural distortion in the right breast upper outer quadrant posterior depth is indeterminate. The architectural distortion in the right breast upper outer quadrant middle depth is indeterminate.   Past/Anticipated interventions by surgeon, if any:  03/31/18:  Procedure: Right breast reexcision lumpectomy Surgeon: Dr. Serita Grammes  03/09/18:  Procedure: 1.  Right breast seed bracketed lumpectomy 2.  Right deep axillary sentinel lymph node biopsy Surgeon: Dr. Serita Grammes  01/03/2018 she proceeded to right needle core biopsy with pathology showing: Breast, right, needle core biopsy, (A) 11 o'clock with invasive mammary carcinoma, grade I-II. Breast, right, needle core biopsy, (B) 11 o'clock with invasive mammary carcinoma, grade I-II. Prognostic indicators significant for: ER, 60% positive with moderate staining intensity and PR, 90% positive with strong staining intensity. Proliferation  marker Ki67 at 1%. HER2 negative.  Past/Anticipated interventions by medical oncology, if any: Chemotherapy: None at this time.  Lymphedema issues, if any:  Pt reports axilla is tender, but no other c/o lymphedema concerns.  Pain issues, if any:  Pt reports breast and axilla tenderness. Pt reports that breast is swollen.   SAFETY ISSUES:  Prior radiation? No  Pacemaker/ICD? No  Possible current pregnancy? N/A, pt is post-surgical  Is the patient on methotrexate? No  Current Complaints / other details:  Pt presents today for consult with Dr. Sondra Come for Radiation Oncology. Pt is unaccompanied. Pt was previously seen by Dr. Sondra Come in [facility].     Loma Sousa, RN 05/02/2018,12:35 PM

## 2018-05-02 ENCOUNTER — Encounter: Payer: Self-pay | Admitting: Radiation Oncology

## 2018-05-02 ENCOUNTER — Other Ambulatory Visit: Payer: Self-pay

## 2018-05-02 ENCOUNTER — Ambulatory Visit
Admission: RE | Admit: 2018-05-02 | Discharge: 2018-05-02 | Disposition: A | Discharge status: Home / Self Care | Payer: Medicare Other | Source: Ambulatory Visit | Attending: Radiation Oncology | Admitting: Radiation Oncology

## 2018-05-02 VITALS — BP 144/77 | HR 75 | Temp 98.3°F | Resp 18 | Ht 63.0 in | Wt 223.0 lb

## 2018-05-02 DIAGNOSIS — Z170 Estrogen receptor positive status [ER+]: Secondary | ICD-10-CM | POA: Diagnosis not present

## 2018-05-02 DIAGNOSIS — Z79890 Hormone replacement therapy (postmenopausal): Secondary | ICD-10-CM | POA: Insufficient documentation

## 2018-05-02 DIAGNOSIS — Z888 Allergy status to other drugs, medicaments and biological substances status: Secondary | ICD-10-CM | POA: Insufficient documentation

## 2018-05-02 DIAGNOSIS — C50411 Malignant neoplasm of upper-outer quadrant of right female breast: Secondary | ICD-10-CM | POA: Insufficient documentation

## 2018-05-02 DIAGNOSIS — Z510 Encounter for antineoplastic radiation therapy: Secondary | ICD-10-CM | POA: Insufficient documentation

## 2018-05-02 DIAGNOSIS — Z79899 Other long term (current) drug therapy: Secondary | ICD-10-CM | POA: Diagnosis not present

## 2018-05-02 DIAGNOSIS — Z882 Allergy status to sulfonamides status: Secondary | ICD-10-CM | POA: Insufficient documentation

## 2018-05-02 DIAGNOSIS — Z881 Allergy status to other antibiotic agents status: Secondary | ICD-10-CM | POA: Insufficient documentation

## 2018-05-02 NOTE — Progress Notes (Addendum)
Radiation Oncology         [phone] ________________________________  Name: Lisa Horton MRN: [id number]  Date: 05/02/2018  DOB: Nov 10, 1945  Re-Evaluation Visit Note  CC: Maury Dus, MD  [name], MD    ICD-10-CM   1. Malignant neoplasm of upper-outer quadrant of right breast in female, estrogen receptor positive ([place]) C50.411    Z17.0     Diagnosis: Pathological Stage IA, mpT2, pN0 Right Breast UOQ Multifocal Invasive lobular Carcinoma, ER(+) / PR(+) / Her2(-), Grade II   Narrative:  The patient returns today for re-evaluation following her surgeries with her most recent being on 03/31/2018. Since her surgeries she has felt well. She has associated pain to the surgical sites (treated with tylenol or Ibuprofen PRN, right breast swelling, resolved fatigue, pulling sensation to right arm when she lifts it, and tingling to right hand. She denies right arm swelling, weakness to right hand, and any other symptoms. She was advised that she has a cyst to her breast and she was advised to wear a compression bra. She lives in [place] and she is not currently working. She doesn't have any trips planned in the next couple of weeks.   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  She also had a left needle core biopsy on 01/24/2018 that showed: Breast, left, needle core biopsy, upper, inner quadrant with benign breast tissue with fibrocystic change, including sclerosing adenosis and associated microcalcifications. Negative for carcinoma.   On 03/09/2018 she had sentinel node biopsy and right lumpectomy that showed: Lymph node, sentinel, biopsy, Right axillary. Of the 5 lymph nodes biopsied via sentinel node, all were without evidence of carcinoma. Breast, lumpectomy, Right w/seed x2 with invasive lobular carcinoma, grade II/III. Two foci spanning 2.5 cm and 2.0 cm. Invasive carcinoma is  broadly present at the superior margin. Prognostic indicators significant for: ER, 60% positive with moderate staining intensity and PR, 90% positive with strong staining intensity. Proliferation marker Ki67 at 1%. HER2 negative.   The Oncotype DX score on 03/09/2018 was 17 predicting a risk of outside the breast recurrence over the next 9 years of 5% if the patient's only systemic therapy is tamoxifen for 5 years. It also predicts no benefit from chemotherapy.  On 03/31/2018, she underwent right breast excision that showed: Breast, excision, Right Superior Margin with inflammation and fibrosis consistent with previous lumpectomy. No residual carcinoma identified. Final superior margin clear.                   ALLERGIES:  is allergic to statins; adhesive [tape]; hydrogen peroxide; lidocaine; metrogel [metronidazole]; latex; neomycin-bacitracin zn-polymyx; sulfa antibiotics; and sulfonamide derivatives.  Meds: Current Outpatient Medications  Medication Sig Dispense Refill  . Albuterol Sulfate (PROAIR RESPICLICK) 476 (90 Base) MCG/ACT AEPB Inhale 2 puffs into the lungs every 4 (four) hours as needed. 1 each 0  . CALCIUM PO Take 1 tablet by mouth daily. Reported on 01/06/2016    . cetirizine (ZYRTEC) 10 MG tablet Take 10 mg by mouth daily.      . fluticasone furoate-vilanterol (BREO ELLIPTA) 200-25 MCG/INH AEPB Inhale 1 puff into the lungs daily. 30 each 5  . irbesartan (AVAPRO) 300 MG tablet Take 300 mg by mouth daily.     Marland Kitchen levothyroxine (SYNTHROID, LEVOTHROID) 112 MCG tablet Take 1 tablet by mouth daily.    . Multiple Vitamins-Minerals (MULTIVITAMINS THER. W/MINERALS) TABS Take 1 tablet  by mouth daily.    Marland Kitchen omeprazole (PRILOSEC) 40 MG capsule Take 1 capsule by mouth daily.    Marland Kitchen oxyCODONE (OXY IR/ROXICODONE) 5 MG immediate release tablet Take 1 tablet (5 mg total) by mouth every 6 (six) hours as needed for moderate pain, severe pain or breakthrough pain. 10 tablet 0  . traMADol (ULTRAM) 50 MG tablet  Take 2 tablets (100 mg total) by mouth every 6 (six) hours as needed. 10 tablet 0  . Vitamin D, Ergocalciferol, (DRISDOL) 50000 units CAPS capsule Take 1 capsule by mouth once a week.  3   No current facility-administered medications for this encounter.     Physical Findings: The patient is in no acute distress. Patient is alert and oriented.  height is '5\' 3"'  (1.6 m) and weight is 223 lb (101.2 kg). Her oral temperature is 98.3 F (36.8 C). Her blood pressure is 144/77 (abnormal) and her pulse is 75. Her respiration is 18 and oxygen saturation is 97%.   Lungs are clear to auscultation bilaterally. Heart has regular rate and rhythm. No palpable cervical, supraclavicular, or axillary adenopathy. Abdomen soft, non-tender, normal bowel sounds. Right breast large and pendulous with scar on the LOQ which is healed well without signs of drainage or infection. Pt has a separate scar in the axillary region from her sentinel node procedure.    Lab Findings: Lab Results  Component Value Date   WBC 5.4 01/12/2018   HGB 12.8 01/12/2018   HCT 39.7 01/12/2018   MCV 92.2 01/12/2018   PLT 171 01/12/2018    Radiographic Findings: No results found.  Impression:  Pathological Stage IA, mpT2, pN0 Right Breast UOQ Multifocal Invasive lobular Carcinoma, ER(+) / PR(+) / Her2(-), Grade II  The pt would be a good candidate for breast conservation with radiation therapy directed at the right breast.   Today, I talked to the patient and family about the findings and work-up thus far.  We discussed the natural history of right breast cancer and general treatment, highlighting the role of radiotherapy in the management.  We discussed the available radiation techniques, and focused on the details of logistics and delivery.  We reviewed the anticipated acute and late sequelae associated with radiation in this setting.  The patient was encouraged to ask questions that I answered to the best of my ability.  A patient  consent form was discussed and signed.  We retained a copy for our records.  The patient would like to proceed with radiation and will be scheduled for CT simulation.   Plan:  Pt will proceed with CT simulation this afternoon with treatments to began in approximately 1 week. The pt would not be a good candidate for hypofractionated acceleration, given her large pendulous breast. Anticipate approximately 6.5 weeks of radiotherapy.   ____________________________________   Blair Promise, PhD, MD    This document serves as a record of services personally performed by Gery Pray, MD. It was created on his behalf by [facility], a trained medical scribe. The creation of this record is based on the scribe's personal observations and the provider's statements to them. This document has been checked and approved by the attending provider.

## 2018-05-02 NOTE — Progress Notes (Signed)
  Radiation Oncology         [phone] ________________________________  Name: Lisa Horton MRN: [id number]  Date: 05/02/2018  DOB: January 07, 1946  SIMULATION AND TREATMENT PLANNING NOTE    ICD-10-CM   1. Malignant neoplasm of upper-outer quadrant of right breast in female, estrogen receptor positive ([place]) C50.411    Z17.0     DIAGNOSIS:  Pathological StageIA, mpT2, pN0 RightBreast UOQ Multifocal InvasivelobularCarcinoma, ER(+)/ PR(+)/ Her2(-), GradeII  NARRATIVE:  The patient was brought to the [place].  Identity was confirmed.  All relevant records and images related to the planned course of therapy were reviewed.  The patient freely provided informed written consent to proceed with treatment after reviewing the details related to the planned course of therapy. The consent form was witnessed and verified by the simulation staff.  Then, the patient was set-up in a stable reproducible  supine position for radiation therapy.  CT images were obtained.  Surface markings were placed.  The CT images were loaded into the planning software.  Then the target and avoidance structures were contoured.  Treatment planning then occurred.  The radiation prescription was entered and confirmed.  Then, I designed and supervised the construction of a total of 3 medically necessary complex treatment devices.  I have requested : 3D Simulation  I have requested a DVH of the following structures: heart, lungs, lumpectomy cavity.  I have ordered:dose calc.  PLAN:  The patient will receive 50.4 Gy in 28 fractions followed by a boost to the lumpectomy cavity of 10 gray in 5 fractions.  -----------------------------------   Optical Surface Tracking Plan:  Since intensity modulated radiotherapy (IMRT) and 3D conformal radiation treatment methods are predicated on accurate and precise positioning for treatment, intrafraction motion monitoring is medically necessary to ensure accurate and  safe treatment delivery.  The ability to quantify intrafraction motion without excessive ionizing radiation dose can only be performed with optical surface tracking. Accordingly, surface imaging offers the opportunity to obtain 3D measurements of patient position throughout IMRT and 3D treatments without excessive radiation exposure.  I am ordering optical surface tracking for this patient's upcoming course of radiotherapy. ________________________________     Blair Promise, PhD, MD  This document serves as a record of services personally performed by Gery Pray, MD. It was created on his behalf by [facility], a trained medical scribe. The creation of this record is based on the scribe's personal observations and the provider's statements to them. This document has been checked and approved by the attending provider.

## 2018-05-03 ENCOUNTER — Telehealth: Payer: Self-pay | Admitting: Hematology and Oncology

## 2018-05-03 NOTE — Telephone Encounter (Signed)
Scheduled appt per 10/1 sch message - reminder letter sent in the mail with appt date and time

## 2018-05-04 DIAGNOSIS — Z170 Estrogen receptor positive status [ER+]: Secondary | ICD-10-CM | POA: Diagnosis not present

## 2018-05-04 DIAGNOSIS — Z510 Encounter for antineoplastic radiation therapy: Secondary | ICD-10-CM | POA: Diagnosis not present

## 2018-05-04 DIAGNOSIS — C50411 Malignant neoplasm of upper-outer quadrant of right female breast: Secondary | ICD-10-CM | POA: Diagnosis present

## 2018-05-09 ENCOUNTER — Ambulatory Visit
Admission: RE | Admit: 2018-05-09 | Discharge: 2018-05-09 | Disposition: A | Discharge status: Home / Self Care | Payer: Medicare Other | Source: Ambulatory Visit | Attending: Radiation Oncology | Admitting: Radiation Oncology

## 2018-05-09 DIAGNOSIS — Z170 Estrogen receptor positive status [ER+]: Secondary | ICD-10-CM

## 2018-05-09 DIAGNOSIS — C50411 Malignant neoplasm of upper-outer quadrant of right female breast: Secondary | ICD-10-CM | POA: Diagnosis not present

## 2018-05-09 NOTE — Progress Notes (Signed)
  Radiation Oncology         [phone] ________________________________  Name: MAKAIAH TERWILLIGER MRN: [id number]  Date: 05/09/2018  DOB: Sep 03, 1945  Simulation Verification Note    ICD-10-CM   1. Malignant neoplasm of upper-outer quadrant of right breast in female, estrogen receptor positive ([place]) C50.411    Z17.0     Status: outpatient  NARRATIVE: The patient was brought to the treatment unit and placed in the planned treatment position. The clinical setup was verified. Then port films were obtained and uploaded to the radiation oncology medical record software.  The treatment beams were carefully compared against the planned radiation fields. The position location and shape of the radiation fields was reviewed. They targeted volume of tissue appears to be appropriately covered by the radiation beams. Organs at risk appear to be excluded as planned.  Based on my personal review, I approved the simulation verification. The patient's treatment will proceed as planned.  -----------------------------------  Blair Promise, PhD, MD  This document serves as a record of services personally performed by Gery Pray, MD. It was created on his behalf by Wilburn Mylar, a trained medical scribe. The creation of this record is based on the scribe's personal observations and the provider's statements to them. This document has been checked and approved by the attending provider.

## 2018-05-10 ENCOUNTER — Ambulatory Visit
Admission: RE | Admit: 2018-05-10 | Discharge: 2018-05-10 | Disposition: A | Discharge status: Home / Self Care | Payer: Medicare Other | Source: Ambulatory Visit | Attending: Radiation Oncology | Admitting: Radiation Oncology

## 2018-05-10 DIAGNOSIS — Z170 Estrogen receptor positive status [ER+]: Principal | ICD-10-CM

## 2018-05-10 DIAGNOSIS — C50411 Malignant neoplasm of upper-outer quadrant of right female breast: Secondary | ICD-10-CM | POA: Diagnosis not present

## 2018-05-10 MED ORDER — RADIAPLEXRX EX GEL
Freq: Once | CUTANEOUS | Status: AC
  Administered 2018-05-10: 17:00:00 via TOPICAL

## 2018-05-10 MED ORDER — ALRA NON-METALLIC DEODORANT (RAD-ONC)
1.0000 "application " | Freq: Once | TOPICAL | Status: AC
  Administered 2018-05-10: 1 via TOPICAL

## 2018-05-10 NOTE — Progress Notes (Signed)
Pt here for patient teaching.  Pt given Radiation and You booklet, skin care instructions, Alra deodorant and Radiaplex gel.  Reviewed areas of pertinence such as fatigue, hair loss, skin changes, breast tenderness and breast swelling . Pt able to give teach back of to pat skin and use unscented/gentle soap,apply Radiaplex bid, avoid applying anything to skin within 4 hours of treatment, avoid wearing an under wire bra and to use an electric razor if they must shave. Pt demonstrated understanding and verbalizes understanding of information given and will contact nursing with any questions or concerns.     [email or web address]/[email or web address]  Jaiceon Collister W Treon Kehl, RN BSN       

## 2018-05-11 ENCOUNTER — Ambulatory Visit
Admission: RE | Admit: 2018-05-11 | Discharge: 2018-05-11 | Disposition: A | Discharge status: Home / Self Care | Payer: Medicare Other | Source: Ambulatory Visit | Attending: Radiation Oncology | Admitting: Radiation Oncology

## 2018-05-11 DIAGNOSIS — C50411 Malignant neoplasm of upper-outer quadrant of right female breast: Secondary | ICD-10-CM | POA: Diagnosis not present

## 2018-05-12 ENCOUNTER — Ambulatory Visit: Payer: Medicare Other | Admitting: Physical Therapy

## 2018-05-12 ENCOUNTER — Ambulatory Visit
Admission: RE | Admit: 2018-05-12 | Discharge: 2018-05-12 | Disposition: A | Discharge status: Home / Self Care | Payer: Medicare Other | Source: Ambulatory Visit | Attending: Radiation Oncology | Admitting: Radiation Oncology

## 2018-05-12 ENCOUNTER — Ambulatory Visit: Payer: Medicare Other

## 2018-05-12 DIAGNOSIS — C50411 Malignant neoplasm of upper-outer quadrant of right female breast: Secondary | ICD-10-CM | POA: Diagnosis not present

## 2018-05-13 ENCOUNTER — Ambulatory Visit: Payer: Medicare Other

## 2018-05-13 ENCOUNTER — Ambulatory Visit
Admission: RE | Admit: 2018-05-13 | Discharge: 2018-05-13 | Disposition: A | Discharge status: Home / Self Care | Payer: Medicare Other | Source: Ambulatory Visit | Attending: Radiation Oncology | Admitting: Radiation Oncology

## 2018-05-13 DIAGNOSIS — C50411 Malignant neoplasm of upper-outer quadrant of right female breast: Secondary | ICD-10-CM | POA: Diagnosis not present

## 2018-05-16 ENCOUNTER — Ambulatory Visit
Admission: RE | Admit: 2018-05-16 | Discharge: 2018-05-16 | Disposition: A | Discharge status: Home / Self Care | Payer: Medicare Other | Source: Ambulatory Visit | Attending: Radiation Oncology | Admitting: Radiation Oncology

## 2018-05-16 DIAGNOSIS — C50411 Malignant neoplasm of upper-outer quadrant of right female breast: Secondary | ICD-10-CM | POA: Diagnosis not present

## 2018-05-17 ENCOUNTER — Other Ambulatory Visit: Payer: Self-pay

## 2018-05-17 ENCOUNTER — Ambulatory Visit
Admission: RE | Admit: 2018-05-17 | Discharge: 2018-05-17 | Disposition: A | Discharge status: Home / Self Care | Payer: Medicare Other | Source: Ambulatory Visit | Attending: Radiation Oncology | Admitting: Radiation Oncology

## 2018-05-17 ENCOUNTER — Ambulatory Visit: Payer: Medicare Other | Attending: General Surgery | Admitting: Physical Therapy

## 2018-05-17 ENCOUNTER — Encounter: Payer: Self-pay | Admitting: Physical Therapy

## 2018-05-17 DIAGNOSIS — L599 Disorder of the skin and subcutaneous tissue related to radiation, unspecified: Secondary | ICD-10-CM | POA: Insufficient documentation

## 2018-05-17 DIAGNOSIS — I890 Lymphedema, not elsewhere classified: Secondary | ICD-10-CM | POA: Diagnosis present

## 2018-05-17 DIAGNOSIS — C50411 Malignant neoplasm of upper-outer quadrant of right female breast: Secondary | ICD-10-CM | POA: Diagnosis not present

## 2018-05-17 DIAGNOSIS — R293 Abnormal posture: Secondary | ICD-10-CM | POA: Diagnosis present

## 2018-05-17 DIAGNOSIS — M25611 Stiffness of right shoulder, not elsewhere classified: Secondary | ICD-10-CM | POA: Diagnosis present

## 2018-05-17 DIAGNOSIS — N6320 Unspecified lump in the left breast, unspecified quadrant: Secondary | ICD-10-CM

## 2018-05-17 DIAGNOSIS — M6281 Muscle weakness (generalized): Secondary | ICD-10-CM | POA: Insufficient documentation

## 2018-05-17 MED ORDER — RADIAPLEXRX EX GEL
Freq: Once | CUTANEOUS | Status: AC
  Administered 2018-05-17: 12:00:00 via TOPICAL

## 2018-05-17 NOTE — Patient Instructions (Signed)
Shoulder: Flexion (Supine)    With hands shoulder width apart, slowly lower dowel to floor behind head. Do not let elbows bend. Keep back flat. Hold _15-30___ seconds. Repeat _10___ times. Do __2__ sessions per day. CAUTION: Stretch slowly and gently.  Copyright  VHI. All rights reserved.  Shoulder: Abduction (Supine)    With right arm flat on floor, hold dowel in palm. Slowly move arm up to side of head by pushing with opposite arm. Do not let elbow bend. Hold _15-30___ seconds. Repeat _10___ times. Do __2__ sessions per day. CAUTION: Stretch slowly and gently.  Copyright  VHI. All rights reserved.   

## 2018-05-17 NOTE — Therapy (Signed)
[place], [place], [address] Phone: [phone]   Fax:  [phone]  Physical Therapy Evaluation  Patient Details  Name: Lisa Horton MRN: [id number] Date of Birth: 01/21/1946 Referring Provider (PT): Dr. Donne Hazel   Encounter Date: 05/17/2018  PT End of Session - 05/17/18 1511    Visit Number  1    Number of Visits  9    Date for PT Re-Evaluation  06/14/18    PT Start Time  6962    PT Stop Time  1511    PT Time Calculation (min)  42 min    Activity Tolerance  Patient tolerated treatment well    Behavior During Therapy  [facility] for tasks assessed/performed       Past Medical History:  Diagnosis Date  . Acid reflux   . Allergic rhinitis   . Arthritis   . Asthma   . Breast lump   . Bruises easily   . Cancer ([place])   . Chicken pox   . Colon polyp   . Family history of breast cancer   . Family history of pancreatic cancer   . Family history of prostate cancer   . Gum disease   . Hypertension   . Hypothyroidism   . Incontinence   . Measles   . Myopathy    arms and legs, statin drug related  . Night sweats   . Nipple discharge   . Pre-diabetes   . Prediabetes 06/2014    Past Surgical History:  Procedure Laterality Date  . ABDOMINAL HYSTERECTOMY  1986   secondary to fibroids  . BREAST LUMPECTOMY WITH RADIOACTIVE SEED AND SENTINEL LYMPH NODE BIOPSY Right 03/09/2018   Procedure: RIGHT BREAST LUMPECTOMY WITH BRACKETED RADIOACTIVE SEED AND SENTINEL LYMPH NODE BIOPSY;  Surgeon: Rolm Bookbinder, MD;  Location: [place];  Service: General;  Laterality: Right;  . BREAST SURGERY  07/09/2008   mass removal  . CHOLECYSTECTOMY  03/17/11  . RE-EXCISION OF BREAST LUMPECTOMY Right 03/31/2018   Procedure: RE-EXCISION OF BREAST LUMPECTOMY;  Surgeon: Rolm Bookbinder, MD;  Location: [place];  Service: General;  Laterality: Right;  . SKIN TAG REMOVAL     brow and  lid  . THIGH / KNEE SOFT TISSUE BIOPSY  09/05/2009  . TUBAL LIGATION  1980    There were no vitals filed for this visit.   Subjective Assessment - 05/17/18 1433    Subjective  I had a right breast lumpectomy on 03/09/18 and they had to go back in on 03/31/18. I am having a lot of soreness due to the swelling and the radiation. I have radiation until the middle of November.     Pertinent History  Patient was diagnosed on 12/28/17 with right grade I invasive lobular carcinoma breast cancer. There are 2 areas in the upper outer quadrant that measure 1.7 cm and 1.8 cm. They are ER/PR positive and HER2 negative with a Ki67 of 1%. She has diabetes and hypertension. R lumpectomy 03/09/18, re excision 03/31/18, currently undergoing radiation    Patient Stated Goals  be able to lift arm all the way up without pain    Currently in Pain?  No/denies    Pain Score  0-No pain         [facility] PT Assessment - 05/17/18 0001      Assessment   Medical Diagnosis  Right breast cancer    Referring Provider (PT)  Dr. Donne Hazel    Onset  Date/Surgical Date  03/09/18    Hand Dominance  Right    Prior Therapy  breast clinic 1 time visit      Precautions   Precautions  Other (comment)    Precaution Comments  at risk for lymphedema      Restrictions   Weight Bearing Restrictions  No      Balance Screen   Has the patient fallen in the past 6 months  No    Has the patient had a decrease in activity level because of a fear of falling?   No    Is the patient reluctant to leave their home because of a fear of falling?   No      Home Film/video editor residence    Living Arrangements  Children   Adult daughter Lisa Horton   Available Help at Discharge  Family      Prior Function   Level of [place]  Retired    Leisure  pt has a Building services engineer and wants to start going to the gym      Cognition   Overall Cognitive Status  Within Functional Limits for tasks assessed       Observation/Other Assessments   Observations  right breast twice the size of left breast with fibrosis in area of lumpectomy and scar and in inferior of breast, breast more red on right due to radiation      Posture/Postural Control   Posture/Postural Control  Postural limitations    Postural Limitations  Rounded Shoulders;Forward head      AROM   Right Shoulder Extension  --    Right Shoulder Flexion  134 Degrees    Right Shoulder ABduction  131 Degrees    Right Shoulder Internal Rotation  57 Degrees    Right Shoulder External Rotation  90 Degrees    Left Shoulder Extension  --    Left Shoulder Flexion  156 Degrees    Left Shoulder ABduction  162 Degrees    Left Shoulder Internal Rotation  65 Degrees    Left Shoulder External Rotation  82 Degrees    Cervical Flexion  --    Cervical Extension  --    Cervical - Right Side Bend  --    Cervical - Left Side Bend  --    Cervical - Right Rotation  --    Cervical - Left Rotation  --      Strength   Overall Strength  --        LYMPHEDEMA/ONCOLOGY QUESTIONNAIRE - 05/17/18 1449      Type   Cancer Type  Right breast cancer      Surgeries   Lumpectomy Date  03/09/18    Other Surgery Date  03/31/18   re excision   Number Lymph Nodes Removed  2      Date Lymphedema/Swelling Started   Date  03/09/18      Treatment   Active Chemotherapy Treatment  No    Past Chemotherapy Treatment  No    Active Radiation Treatment  Yes    Past Radiation Treatment  No      What other symptoms do you have   Are you Having Heaviness or Tightness  Yes    Are you having Pain  Yes    Are you having pitting edema  Yes    Is it Hard or Difficult finding clothes that fit  No    Do you  have infections  No    Is there Decreased scar mobility  Yes      Lymphedema Assessments   Lymphedema Assessments  --      Right Upper Extremity Lymphedema   10 cm Proximal to Olecranon Process  36 cm    Olecranon Process  28.9 cm    10 cm Proximal to  Ulnar Styloid Process  23.5 cm    Just Proximal to Ulnar Styloid Process  18.5 cm    Across Hand at [employer]  20.3 cm    At [place] of 2nd Digit  6.4 cm      Left Upper Extremity Lymphedema   10 cm Proximal to Olecranon Process  35.5 cm    Olecranon Process  30.2 cm    10 cm Proximal to Ulnar Styloid Process  25.1 cm    Just Proximal to Ulnar Styloid Process  17.8 cm    Across Hand at [employer]  20.2 cm    At [place] of 2nd Digit  6.1 cm             Outpatient Re[facility]17/[facility]  Lymphedema Life Impact Scale Total Score  26.47 %      Objective measurements completed on examination: See above findings.              PT Education - 05/17/18 1511    Education Details  anatomy and physiology of lymphatic system, lymphedema risk reduction practices, supine cane exercises    Person(s) Educated  Patient    Methods  Explanation;Handout;Demonstration    Comprehension  Verbalized understanding;Returned demonstration          PT Long Term Goals - 05/17/18 1711      PT LONG TERM GOAL #1   Title  Pt will report a 50% improvement in right breast swelling to decrease risk of infection    Baseline  R breast is 2x size of left breast    Time  4    Period  Weeks    Status  New    Target Date  06/14/18      PT LONG TERM GOAL #2   Title  Pt will demonstrate 150 degrees of right shoulder flexion to allow her to reach overhead    Baseline  134 degrees    Time  4    Period  Weeks    Status  New    Target Date  06/14/18      PT LONG TERM GOAL #3   Title  Pt will demonstrate 160 degrees of right shoulder abduction to allow her to reach out to sides    Baseline  131    Time  4    Period  Weeks    Status  New    Target Date  06/14/18      PT LONG TERM GOAL #4   Title  Pt will be independent in self breast MLD for long term management of swelling    Time  4    Period  Weeks    Status  New    Target Date   06/14/18      PT LONG TERM GOAL #5   Title  Pt will be independent in a home exercise program for continued strengthening and stretching    Time  4    Period  Weeks    Status  New    Target Date  06/14/18  Plan - 05/17/18 1512    Clinical Impression Statement  Pt presents to PT with R breast lymphedema and a seroma that began following a lumpectomy (03/09/18) and re excision on 03/31/18. Pt is currently undergoing radiation and reports that her swelling has been getting worse since then and her pain is the worst it has been. Pt's right breast is twice the size of her left and there is signficant fibrosis in the area of her lumpectomy scar and in inferior breast. She has a compression bra and wears it 24/7 but is not able to tell a difference in regards to her swelling. Pt also has decreased right shoulder ROM. She would benefit from skilled PT services to decrease right breast swelling, decrease fibrosis, improve scar mobililty, increase R shoulder ROM and instruct pt in a home exercise program.     History and Personal Factors relevant to plan of care:  right handed, had to have re excision of tumor    Clinical Presentation  Evolving    Clinical Presentation due to:  pt currently receiving radiation    Clinical Decision Making  Moderate    Rehab Potential  Good    Clinical Impairments Affecting Rehab Potential  undergoing radiation    PT Frequency  2x / week    PT Duration  4 weeks    PT Treatment/Interventions  ADLs/Self Care Home Management;Patient/family education;Therapeutic activities;Therapeutic exercise;Scar mobilization;Compression bandaging;Manual lymph drainage;Manual techniques;Passive range of motion;Taping;Vasopneumatic Device    PT Next Visit Plan  begin R breast MLD and instruct pt in this, assess indep with supine cane exercise    PT Home Exercise Plan  supine cane exercise    Consulted and Agree with Plan of Care  Patient       Patient will benefit from  skilled therapeutic intervention in order to improve the following deficits and impairments:  Increased fascial restricitons, Pain, Decreased scar mobility, Postural dysfunction, Decreased range of motion, Decreased strength, Impaired UE functional use, Increased edema  Visit Diagnosis: Lymphedema, not elsewhere classified - Plan: PT plan of care cert/re-cert  Stiffness of right shoulder joint - Plan: PT plan of care cert/re-cert  Disorder of the skin and subcutaneous tissue related to radiation, unspecified - Plan: PT plan of care cert/re-cert  Abnormal posture - Plan: PT plan of care cert/re-cert  Muscle weakness (generalized) - Plan: PT plan of care cert/re-cert     Problem List Patient Active Problem List   Diagnosis Date Noted  . Genetic testing 02/04/2018  . Family history of pancreatic cancer   . Family history of prostate cancer   . Family history of breast cancer   . Malignant neoplasm of upper-outer quadrant of right breast in female, estrogen receptor positive ([place]) 01/07/2018  . Moderate persistent asthma without complication 39/76/7341  . Seasonal and perennial allergic rhinitis 04/26/2017  . Allergy 01/28/2017  . Allergic rhinitis 11/28/2015  . Cough 11/28/2015  . Breast pain 08/25/2012  . Left breast mass 10/29/2011  . Symptomatic cholecystitis 02/24/2011  . HYPOTHYROIDISM 10/02/2010  . HYPERTENSION 10/02/2010  . G E R D 10/02/2010  . OSTEOARTHRITIS 10/02/2010    Allyson Sabal [facility] 05/17/2018, 5:18 PM  [place], [place], [address] Phone: [phone]   Fax:  [phone]  Name: Lisa Horton MRN: [id number] Date of Birth: [birth year]  Manus Gunning, PT 05/17/18 5:18 PM

## 2018-05-18 ENCOUNTER — Ambulatory Visit
Admission: RE | Admit: 2018-05-18 | Discharge: 2018-05-18 | Disposition: A | Discharge status: Home / Self Care | Payer: Medicare Other | Source: Ambulatory Visit | Attending: Radiation Oncology | Admitting: Radiation Oncology

## 2018-05-18 DIAGNOSIS — C50411 Malignant neoplasm of upper-outer quadrant of right female breast: Secondary | ICD-10-CM | POA: Diagnosis not present

## 2018-05-19 ENCOUNTER — Ambulatory Visit
Admission: RE | Admit: 2018-05-19 | Discharge: 2018-05-19 | Disposition: A | Discharge status: Home / Self Care | Payer: Medicare Other | Source: Ambulatory Visit | Attending: Radiation Oncology | Admitting: Radiation Oncology

## 2018-05-19 DIAGNOSIS — C50411 Malignant neoplasm of upper-outer quadrant of right female breast: Secondary | ICD-10-CM | POA: Diagnosis not present

## 2018-05-20 ENCOUNTER — Ambulatory Visit
Admission: RE | Admit: 2018-05-20 | Discharge: 2018-05-20 | Disposition: A | Discharge status: Home / Self Care | Payer: Medicare Other | Source: Ambulatory Visit | Attending: Radiation Oncology | Admitting: Radiation Oncology

## 2018-05-20 DIAGNOSIS — C50411 Malignant neoplasm of upper-outer quadrant of right female breast: Secondary | ICD-10-CM | POA: Diagnosis not present

## 2018-05-23 ENCOUNTER — Ambulatory Visit
Admission: RE | Admit: 2018-05-23 | Discharge: 2018-05-23 | Disposition: A | Discharge status: Home / Self Care | Payer: Medicare Other | Source: Ambulatory Visit | Attending: Radiation Oncology | Admitting: Radiation Oncology

## 2018-05-23 ENCOUNTER — Other Ambulatory Visit: Payer: Self-pay

## 2018-05-23 ENCOUNTER — Ambulatory Visit: Payer: Medicare Other | Admitting: Physical Therapy

## 2018-05-23 ENCOUNTER — Encounter: Payer: Self-pay | Admitting: Physical Therapy

## 2018-05-23 DIAGNOSIS — C50411 Malignant neoplasm of upper-outer quadrant of right female breast: Secondary | ICD-10-CM | POA: Diagnosis not present

## 2018-05-23 DIAGNOSIS — I890 Lymphedema, not elsewhere classified: Secondary | ICD-10-CM

## 2018-05-23 NOTE — Therapy (Signed)
[place], [place], [address] Phone: [phone]   Fax:  [phone]  Physical Therapy Treatment  Patient Details  Name: Lisa Horton MRN: [id number] Date of Birth: January 23, 1946 Referring Provider (PT): Dr. Donne Hazel   Encounter Date: 05/23/2018  PT End of Session - 05/23/18 1520    Visit Number  2    Number of Visits  9    Date for PT Re-Evaluation  06/14/18    PT Start Time  0240    PT Stop Time  1520    PT Time Calculation (min)  47 min    Activity Tolerance  Patient tolerated treatment well    Behavior During Therapy  [facility] for tasks assessed/performed       Past Medical History:  Diagnosis Date  . Acid reflux   . Allergic rhinitis   . Arthritis   . Asthma   . Breast lump   . Bruises easily   . Cancer ([place])   . Chicken pox   . Colon polyp   . Family history of breast cancer   . Family history of pancreatic cancer   . Family history of prostate cancer   . Gum disease   . Hypertension   . Hypothyroidism   . Incontinence   . Measles   . Myopathy    arms and legs, statin drug related  . Night sweats   . Nipple discharge   . Pre-diabetes   . Prediabetes 06/2014    Past Surgical History:  Procedure Laterality Date  . ABDOMINAL HYSTERECTOMY  1986   secondary to fibroids  . BREAST LUMPECTOMY WITH RADIOACTIVE SEED AND SENTINEL LYMPH NODE BIOPSY Right 03/09/2018   Procedure: RIGHT BREAST LUMPECTOMY WITH BRACKETED RADIOACTIVE SEED AND SENTINEL LYMPH NODE BIOPSY;  Surgeon: Rolm Bookbinder, MD;  Location: [place];  Service: General;  Laterality: Right;  . BREAST SURGERY  07/09/2008   mass removal  . CHOLECYSTECTOMY  03/17/11  . RE-EXCISION OF BREAST LUMPECTOMY Right 03/31/2018   Procedure: RE-EXCISION OF BREAST LUMPECTOMY;  Surgeon: Rolm Bookbinder, MD;  Location: [place];  Service: General;  Laterality: Right;  . SKIN TAG REMOVAL     brow and lid   . THIGH / KNEE SOFT TISSUE BIOPSY  09/05/2009  . TUBAL LIGATION  1980    There were no vitals filed for this visit.  Subjective Assessment - 05/23/18 1436    Subjective  My breast is hurting today. I have to hold this pillow against my breast when I drive to keep it from bouncing.     Pertinent History  Patient was diagnosed on 12/28/17 with right grade I invasive lobular carcinoma breast cancer. There are 2 areas in the upper outer quadrant that measure 1.7 cm and 1.8 cm. They are ER/PR positive and HER2 negative with a Ki67 of 1%. She has diabetes and hypertension. R lumpectomy 03/09/18, re excision 03/31/18, currently undergoing radiation    Patient Stated Goals  be able to lift arm all the way up without pain    Currently in Pain?  Yes    Pain Score  4     Pain Location  Breast    Pain Orientation  Right    Pain Descriptors / Indicators  Aching    Pain Type  Acute pain    Aggravating Factors   walking                  Outpatient Rehab from  [facility]17/[facility]  Lymphedema Life Impact Scale Total Score  26.47 %           [facility] Adult PT Treatment/Exercise - 05/23/18 0001      Manual Therapy   Manual Therapy  Manual Lymphatic Drainage (MLD);Soft tissue mobilization    Soft tissue mobilization  gently along lumpectomy scar in area of fibrosis    Manual Lymphatic Drainage (MLD)  short neck, superficial and deep abdominals, left axillary nodes and establishment of interaxillary pathway, right inguinal nodes and establishment of axillo inguinal pathway, right breast moving fluid towards pathways then retracing pathways                  PT Long Term Goals - 05/17/18 1711      PT LONG TERM GOAL #1   Title  Pt will report a 50% improvement in right breast swelling to decrease risk of infection    Baseline  R breast is 2x size of left breast    Time  4    Period  Weeks    Status  New    Target Date  06/14/18      PT LONG  TERM GOAL #2   Title  Pt will demonstrate 150 degrees of right shoulder flexion to allow her to reach overhead    Baseline  134 degrees    Time  4    Period  Weeks    Status  New    Target Date  06/14/18      PT LONG TERM GOAL #3   Title  Pt will demonstrate 160 degrees of right shoulder abduction to allow her to reach out to sides    Baseline  131    Time  4    Period  Weeks    Status  New    Target Date  06/14/18      PT LONG TERM GOAL #4   Title  Pt will be independent in self breast MLD for long term management of swelling    Time  4    Period  Weeks    Status  New    Target Date  06/14/18      PT LONG TERM GOAL #5   Title  Pt will be independent in a home exercise program for continued strengthening and stretching    Time  4    Period  Weeks    Status  New    Target Date  06/14/18            Plan - 05/23/18 1521    Clinical Impression Statement  Began MLD to R breast today. Pt was having increased pain and tenderness in right breast. She is currently undergoing radiation and has increased fibrosis in right breast especially in area of scars. Pt stated her breast felt better at end of session today.     Rehab Potential  Good    Clinical Impairments Affecting Rehab Potential  undergoing radiation    PT Frequency  2x / week    PT Duration  4 weeks    PT Treatment/Interventions  ADLs/Self Care Home Management;Patient/family education;Therapeutic activities;Therapeutic exercise;Scar mobilization;Compression bandaging;Manual lymph drainage;Manual techniques;Passive range of motion;Taping;Vasopneumatic Device    PT Next Visit Plan  continue R breast MLD and give intructions in correct technique    PT Home Exercise Plan  supine cane exercise    Consulted and Agree with Plan of Care  Patient       Patient will  benefit from skilled therapeutic intervention in order to improve the following deficits and impairments:  Increased fascial restricitons, Pain, Decreased scar  mobility, Postural dysfunction, Decreased range of motion, Decreased strength, Impaired UE functional use, Increased edema  Visit Diagnosis: Lymphedema, not elsewhere classified     Problem List Patient Active Problem List   Diagnosis Date Noted  . Genetic testing 02/04/2018  . Family history of pancreatic cancer   . Family history of prostate cancer   . Family history of breast cancer   . Malignant neoplasm of upper-outer quadrant of right breast in female, estrogen receptor positive ([place]) 01/07/2018  . Moderate persistent asthma without complication 57/97/2820  . Seasonal and perennial allergic rhinitis 04/26/2017  . Allergy 01/28/2017  . Allergic rhinitis 11/28/2015  . Cough 11/28/2015  . Breast pain 08/25/2012  . Left breast mass 10/29/2011  . Symptomatic cholecystitis 02/24/2011  . HYPOTHYROIDISM 10/02/2010  . HYPERTENSION 10/02/2010  . G E R D 10/02/2010  . OSTEOARTHRITIS 10/02/2010    Allyson Sabal [facility] 05/23/2018, 3:22 PM  [place], [place], [address] Phone: [phone]   Fax:  [phone]  Name: Lisa Horton MRN: [id number] Date of Birth: 1945-10-12  Manus Gunning, PT 05/23/18 3:23 PM

## 2018-05-24 ENCOUNTER — Ambulatory Visit
Admission: RE | Admit: 2018-05-24 | Discharge: 2018-05-24 | Disposition: A | Discharge status: Home / Self Care | Payer: Medicare Other | Source: Ambulatory Visit | Attending: Radiation Oncology | Admitting: Radiation Oncology

## 2018-05-24 DIAGNOSIS — C50411 Malignant neoplasm of upper-outer quadrant of right female breast: Secondary | ICD-10-CM | POA: Diagnosis not present

## 2018-05-25 ENCOUNTER — Ambulatory Visit: Payer: Medicare Other

## 2018-05-25 ENCOUNTER — Ambulatory Visit
Admission: RE | Admit: 2018-05-25 | Discharge: 2018-05-25 | Disposition: A | Discharge status: Home / Self Care | Payer: Medicare Other | Source: Ambulatory Visit | Attending: Radiation Oncology | Admitting: Radiation Oncology

## 2018-05-25 DIAGNOSIS — I890 Lymphedema, not elsewhere classified: Secondary | ICD-10-CM

## 2018-05-25 DIAGNOSIS — C50411 Malignant neoplasm of upper-outer quadrant of right female breast: Secondary | ICD-10-CM | POA: Diagnosis not present

## 2018-05-25 DIAGNOSIS — M25611 Stiffness of right shoulder, not elsewhere classified: Secondary | ICD-10-CM

## 2018-05-25 DIAGNOSIS — M6281 Muscle weakness (generalized): Secondary | ICD-10-CM

## 2018-05-25 DIAGNOSIS — R293 Abnormal posture: Secondary | ICD-10-CM

## 2018-05-25 DIAGNOSIS — L599 Disorder of the skin and subcutaneous tissue related to radiation, unspecified: Secondary | ICD-10-CM

## 2018-05-25 NOTE — Patient Instructions (Signed)
Self manual lymph drainage:     Cancer Rehab [phone] Perform this sequence once a day.  Only give enough pressure no your skin to make the skin move. Start with circles near neck above collarbones, 10 times each side. Diaphragmatic - Supine   Inhale through nose making navel move out toward hands. Exhale through puckered lips, hands follow navel in. Repeat _5__ times. Rest _10__ seconds between repeats.   Copyright  VHI. All rights reserved.  Hug yourself.  Do circles at your neck just above your collarbones.  Repeat this 10 times.  Axilla - One at a Time   Using full weight of flat hand and fingers at center of uninvolved armpit, make _10__ in-place circles.   Copyright  VHI. All rights reserved.  LEG: Inguinal Nodes Stimulation   With small finger side of hand against hip crease on involved side, gently perform circles at the crease. Repeat __10_ times.   Copyright  VHI. All rights reserved.  1) Axilla to Inguinal Nodes - Sweep   On involved side, sweep _4__ times from armpit along side of trunk to hip crease.  Now gently stretch skin from the involved side to the uninvolved side across the chest at the shoulder line.  Repeat that 4 times.  Draw an imaginary diagonal line from upper outer breast through the nipple area toward lower inner breast.  Direct fluid upward and inward from this line toward the pathway across your upper chest .  Do this in three rows to treat all of the upper inner breast tissue, and do each row 3-4x.      Direct fluid to treat all of lower outer breast tissue downward and outward toward pathway that is aimed at the right groin.  Finish by doing the pathways as described above going from your involved armpit to the same side groin and going across your upper chest from the involved shoulder to the uninvolved shoulder.  Repeat the steps above where you do circles in your right groin and left armpit.

## 2018-05-25 NOTE — Therapy (Signed)
[place], [place], [address] Phone: [phone]   Fax:  [phone]  Physical Therapy Treatment  Patient Details  Name: Lisa Horton MRN: [id number] Date of Birth: Dec 04, 1945 Referring Provider (PT): Dr. Donne Hazel   Encounter Date: 05/25/2018  PT End of Session - 05/25/18 1158    Visit Number  3    Number of Visits  9    Date for PT Re-Evaluation  06/14/18    PT Start Time  1107    PT Stop Time  1158    PT Time Calculation (min)  51 min    Activity Tolerance  Patient tolerated treatment well    Behavior During Therapy  [facility] for tasks assessed/performed       Past Medical History:  Diagnosis Date  . Acid reflux   . Allergic rhinitis   . Arthritis   . Asthma   . Breast lump   . Bruises easily   . Cancer ([place])   . Chicken pox   . Colon polyp   . Family history of breast cancer   . Family history of pancreatic cancer   . Family history of prostate cancer   . Gum disease   . Hypertension   . Hypothyroidism   . Incontinence   . Measles   . Myopathy    arms and legs, statin drug related  . Night sweats   . Nipple discharge   . Pre-diabetes   . Prediabetes 06/2014    Past Surgical History:  Procedure Laterality Date  . ABDOMINAL HYSTERECTOMY  1986   secondary to fibroids  . BREAST LUMPECTOMY WITH RADIOACTIVE SEED AND SENTINEL LYMPH NODE BIOPSY Right 03/09/2018   Procedure: RIGHT BREAST LUMPECTOMY WITH BRACKETED RADIOACTIVE SEED AND SENTINEL LYMPH NODE BIOPSY;  Surgeon: Rolm Bookbinder, MD;  Location: [facility];  Service: General;  Laterality: Right;  . BREAST SURGERY  07/09/2008   mass removal  . CHOLECYSTECTOMY  03/17/11  . RE-EXCISION OF BREAST LUMPECTOMY Right 03/31/2018   Procedure: RE-EXCISION OF BREAST LUMPECTOMY;  Surgeon: Rolm Bookbinder, MD;  Location: [facility];  Service: General;  Laterality: Right;  . SKIN TAG REMOVAL     brow and lid   . THIGH / KNEE SOFT TISSUE BIOPSY  09/05/2009  . TUBAL LIGATION  1980    There were no vitals filed for this visit.  Subjective Assessment - 05/25/18 1110    Subjective  My Rt breast felt good after last visit for a whole day! But the swelling was right back again after that. Right now I am just sore and tender at my Rt breast (incision) and axilla. When I walk I can feel fluid "sloshing" in my breast.     Pertinent History  Patient was diagnosed on 12/28/17 with right grade I invasive lobular carcinoma breast cancer. There are 2 areas in the upper outer quadrant that measure 1.7 cm and 1.8 cm. They are ER/PR positive and HER2 negative with a Ki67 of 1%. She has diabetes and hypertension. R lumpectomy 03/09/18, re excision 03/31/18, currently undergoing radiation    Patient Stated Goals  be able to lift arm all the way up without pain    Currently in Pain?  No/denies                  Outpatient Rehab [facility]/17/2018 [facility]  Lymphedema Life Impact Scale Total Score  26.47 %  [place] Adult PT Treatment/Exercise - 05/25/18 0001      Manual Therapy   Soft tissue mobilization  gently along lumpectomy scar in area of fibrosis    Manual Lymphatic Drainage (MLD)  short neck, superficial and deep abdominals, left axillary nodes and establishment of interaxillary pathway, right inguinal nodes and establishment of axillo inguinal pathway, right breast moving fluid towards pathways then retracing pathways; then into Lt S/L for better access to lateral breast and further work along Rt axillo-inguinal anastomosis, and briefly to posterior inter-axillary anastomosis, then finished in supine retracing steps along pathways instructing pt throughout and having her return demonstration.             PT Education - 05/25/18 1159    Education Details  Self MLD of Rt breast    Person(s) Educated  Patient    Methods   Explanation;Demonstration;Handout;Tactile cues    Comprehension  Verbalized understanding;Returned demonstration;Need further instruction;Tactile cues required;Verbal cues required          PT Long Term Goals - 05/17/18 1711      PT LONG TERM GOAL #1   Title  Pt will report a 50% improvement in right breast swelling to decrease risk of infection    Baseline  R breast is 2x size of left breast    Time  4    Period  Weeks    Status  New    Target Date  06/14/18      PT LONG TERM GOAL #2   Title  Pt will demonstrate 150 degrees of right shoulder flexion to allow her to reach overhead    Baseline  134 degrees    Time  4    Period  Weeks    Status  New    Target Date  06/14/18      PT LONG TERM GOAL #3   Title  Pt will demonstrate 160 degrees of right shoulder abduction to allow her to reach out to sides    Baseline  131    Time  4    Period  Weeks    Status  New    Target Date  06/14/18      PT LONG TERM GOAL #4   Title  Pt will be independent in self breast MLD for long term management of swelling    Time  4    Period  Weeks    Status  New    Target Date  06/14/18      PT LONG TERM GOAL #5   Title  Pt will be independent in a home exercise program for continued strengthening and stretching    Time  4    Period  Weeks    Status  New    Target Date  06/14/18            Plan - 05/25/18 1201    Clinical Impression Statement  Continued with manual lymph drainage to Rt breast today in supine and Lt S/L. When in Lt S/L the swelling was much more palpable and resembles a seroma so with patients permission updated Dr. Donne Hazel regarding these findings. Began instructing pt in self MLD today with having her return demonstration though she will need further review as she was having trouble with getting a good skin stretch.     Rehab Potential  Good    Clinical Impairments Affecting Rehab Potential  undergoing radiation    PT Frequency  2x / week    PT Duration  4  weeks     PT Treatment/Interventions  ADLs/Self Care Home Management;Patient/family education;Therapeutic activities;Therapeutic exercise;Scar mobilization;Compression bandaging;Manual lymph drainage;Manual techniques;Passive range of motion;Taping;Vasopneumatic Device    PT Next Visit Plan  continue Rt breast MLD and review for correct technique; assess how pt tolerated foam and did we/she hear from Dr. Donne Hazel?    Consulted and Agree with Plan of Care  Patient       Patient will benefit from skilled therapeutic intervention in order to improve the following deficits and impairments:  Increased fascial restricitons, Pain, Decreased scar mobility, Postural dysfunction, Decreased range of motion, Decreased strength, Impaired UE functional use, Increased edema  Visit Diagnosis: Lymphedema, not elsewhere classified  Stiffness of right shoulder joint  Disorder of the skin and subcutaneous tissue related to radiation, unspecified  Abnormal posture  Muscle weakness (generalized)     Problem List Patient Active Problem List   Diagnosis Date Noted  . Genetic testing 02/04/2018  . Family history of pancreatic cancer   . Family history of prostate cancer   . Family history of breast cancer   . Malignant neoplasm of upper-outer quadrant of right breast in female, estrogen receptor positive ([place]) 01/07/2018  . Moderate persistent asthma without complication 16/83/7290  . Seasonal and perennial allergic rhinitis 04/26/2017  . Allergy 01/28/2017  . Allergic rhinitis 11/28/2015  . Cough 11/28/2015  . Breast pain 08/25/2012  . Left breast mass 10/29/2011  . Symptomatic cholecystitis 02/24/2011  . HYPOTHYROIDISM 10/02/2010  . HYPERTENSION 10/02/2010  . G E R D 10/02/2010  . OSTEOARTHRITIS 10/02/2010    Otelia Limes, PTA 05/25/2018, 12:24 PM  [place], [place], [address] Phone: [phone]   Fax:   [phone]  Name: ALISIA VANENGEN MRN: [id number] Date of Birth: 1946-05-18

## 2018-05-26 ENCOUNTER — Ambulatory Visit
Admission: RE | Admit: 2018-05-26 | Discharge: 2018-05-26 | Disposition: A | Discharge status: Home / Self Care | Payer: Medicare Other | Source: Ambulatory Visit | Attending: Radiation Oncology | Admitting: Radiation Oncology

## 2018-05-26 DIAGNOSIS — C50411 Malignant neoplasm of upper-outer quadrant of right female breast: Secondary | ICD-10-CM | POA: Diagnosis not present

## 2018-05-27 ENCOUNTER — Ambulatory Visit
Admission: RE | Admit: 2018-05-27 | Discharge: 2018-05-27 | Disposition: A | Discharge status: Home / Self Care | Payer: Medicare Other | Source: Ambulatory Visit | Attending: Radiation Oncology | Admitting: Radiation Oncology

## 2018-05-27 DIAGNOSIS — C50411 Malignant neoplasm of upper-outer quadrant of right female breast: Secondary | ICD-10-CM | POA: Diagnosis not present

## 2018-05-30 ENCOUNTER — Ambulatory Visit: Payer: Medicare Other

## 2018-05-30 ENCOUNTER — Ambulatory Visit
Admission: RE | Admit: 2018-05-30 | Discharge: 2018-05-30 | Disposition: A | Discharge status: Home / Self Care | Payer: Medicare Other | Source: Ambulatory Visit | Attending: Radiation Oncology | Admitting: Radiation Oncology

## 2018-05-30 ENCOUNTER — Ambulatory Visit: Admission: RE | Admit: 2018-05-30 | Payer: Medicare Other | Source: Ambulatory Visit

## 2018-05-30 DIAGNOSIS — Z170 Estrogen receptor positive status [ER+]: Principal | ICD-10-CM

## 2018-05-30 DIAGNOSIS — C50411 Malignant neoplasm of upper-outer quadrant of right female breast: Secondary | ICD-10-CM

## 2018-05-30 NOTE — Progress Notes (Signed)
Pt presented today prior to treatment to be evaluated by Dr. Sondra Come. Pt reports that over weekend, incision site opened and drained a significant amount of "milky" drainage. Pt reports that prior to incision opening, that area was raised and taut. On examination, breast is warm, red, and swollen. Open area is less than a centimeter in size. Serous drainage is easily expressed. Pt reports an appt with surgeon today at 1130. Pt and daughter were strongly encouraged to update this RN/Dr. Sondra Come after surgeon appt. Pt and daughter verbalized understanding. Loma Sousa, RN BSN

## 2018-05-30 NOTE — Progress Notes (Signed)
  Radiation Oncology         [phone] ________________________________  Name: Lisa Horton MRN: [id number]  Date: 05/30/2018  DOB: Mar 17, 1946  Weekly Radiation Therapy Management    ICD-10-CM   1. Malignant neoplasm of upper-outer quadrant of right breast in female, estrogen receptor positive ([place]) C50.411    Z17.0     Pt presented today prior to treatment to be evaluated by Dr. Sondra Come. Pt reports that over weekend, incision site opened and drained a significant amount of "milky" drainage. Pt reports that prior to incision opening, that area was raised and taut. She also reported stabbing pain in breast intermittently, for which she is taking ccasional aspirin. She denies fever, cough or other symptoms.   Current Dose: 27 Gy     Planned Dose:  60.4 Gy  Narrative . . . . . . . . The patient presents for routine under treatment assessment.                                                                 Set-up films were reviewed.                                 The chart was checked. Physical Findings. . .   Lungs are clear to auscultation bilaterally. Heart has regular rate and rhythm. No palpable cervical, supraclavicular, or axillary adenopathy. Abdomen soft, non-tender, normal bowel sounds.  On examination, breast is warm, hyperpigmented, red, and swollen. Open area is less than a centimeter in size. Serous drainage is easily expressed  Impression . . . . . . . PathologicalStageIA,mpT2, pN0RightBreast UOQMultifocal InvasivelobularCarcinoma, ER(+)/ PR(+)/ Her2(-), GradeII. Wound separation and drainage as above.  Plan . . . . . . . . . . . . Treatment held today.  The patient will see Dr. Donne Hazel at 11:30 for further input  ________________________________   Blair Promise, PhD, MD

## 2018-05-31 ENCOUNTER — Ambulatory Visit: Payer: Medicare Other

## 2018-06-01 ENCOUNTER — Ambulatory Visit: Payer: Medicare Other

## 2018-06-02 ENCOUNTER — Ambulatory Visit: Payer: Medicare Other

## 2018-06-03 ENCOUNTER — Ambulatory Visit: Payer: Medicare Other

## 2018-06-06 ENCOUNTER — Ambulatory Visit: Payer: Medicare Other

## 2018-06-07 ENCOUNTER — Ambulatory Visit: Payer: Medicare Other | Admitting: Radiation Oncology

## 2018-06-07 ENCOUNTER — Ambulatory Visit: Payer: Medicare Other

## 2018-06-08 ENCOUNTER — Ambulatory Visit: Payer: Medicare Other

## 2018-06-09 ENCOUNTER — Ambulatory Visit: Payer: Medicare Other

## 2018-06-10 ENCOUNTER — Encounter: Payer: Medicare Other | Admitting: Physical Therapy

## 2018-06-10 ENCOUNTER — Ambulatory Visit: Payer: Medicare Other

## 2018-06-13 ENCOUNTER — Ambulatory Visit (HOSPITAL_COMMUNITY)
Admission: RE | Admit: 2018-06-13 | Discharge: 2018-06-13 | Disposition: A | Discharge status: Home / Self Care | Payer: Medicare Other | Source: Ambulatory Visit | Attending: Radiation Oncology | Admitting: Radiation Oncology

## 2018-06-13 ENCOUNTER — Telehealth: Payer: Self-pay | Admitting: *Deleted

## 2018-06-13 ENCOUNTER — Ambulatory Visit
Admission: RE | Admit: 2018-06-13 | Discharge: 2018-06-13 | Disposition: A | Discharge status: Home / Self Care | Payer: Medicare Other | Source: Ambulatory Visit | Attending: Radiation Oncology | Admitting: Radiation Oncology

## 2018-06-13 DIAGNOSIS — M7989 Other specified soft tissue disorders: Secondary | ICD-10-CM

## 2018-06-13 DIAGNOSIS — C50411 Malignant neoplasm of upper-outer quadrant of right female breast: Secondary | ICD-10-CM | POA: Diagnosis present

## 2018-06-13 DIAGNOSIS — Z170 Estrogen receptor positive status [ER+]: Secondary | ICD-10-CM | POA: Diagnosis not present

## 2018-06-13 DIAGNOSIS — Z510 Encounter for antineoplastic radiation therapy: Secondary | ICD-10-CM | POA: Diagnosis not present

## 2018-06-13 NOTE — Telephone Encounter (Signed)
CALLED PATIENT TO INFORM OF DOPPLER FOR 06-13-18 - ARRIVAL TIME - 12:30 PM @ [facility] ADMITTING, TEST TO BE @ 1 PM @ [facility], SPOKE WITH PATIENT AND SHE IS AWARE OF THIS TEST

## 2018-06-13 NOTE — Progress Notes (Signed)
LLE venous duplex prelim: negative for DVT.  Landry Mellow, RDMS, RVT   Called results to Dr. Sondra Come.

## 2018-06-14 ENCOUNTER — Ambulatory Visit
Admission: RE | Admit: 2018-06-14 | Discharge: 2018-06-14 | Disposition: A | Discharge status: Home / Self Care | Payer: Medicare Other | Source: Ambulatory Visit | Attending: Radiation Oncology | Admitting: Radiation Oncology

## 2018-06-14 DIAGNOSIS — C50411 Malignant neoplasm of upper-outer quadrant of right female breast: Secondary | ICD-10-CM

## 2018-06-14 DIAGNOSIS — Z170 Estrogen receptor positive status [ER+]: Principal | ICD-10-CM

## 2018-06-14 NOTE — Progress Notes (Signed)
  Radiation Oncology         [phone] ________________________________  Name: Lisa Horton MRN: [id number]  Date: 06/14/2018  DOB: 28-Nov-1945  SIMULATION AND TREATMENT PLANNING NOTE    ICD-10-CM   1. Malignant neoplasm of upper-outer quadrant of right breast in female, estrogen receptor positive ([place]) C50.411    Z17.0     DIAGNOSIS:  Pathological StageIA, mpT2, pN0 RightBreast UOQ Multifocal InvasivelobularCarcinoma, ER(+)/ PR(+)/ Her2(-), GradeII  NARRATIVE:  The patient was brought to the [facility].  Identity was confirmed.  All relevant records and images related to the planned course of therapy were reviewed.  The patient freely provided informed written consent to proceed with treatment after reviewing the details related to the planned course of therapy. The consent form was witnessed and verified by the simulation staff.  Then, the patient was set-up in a stable reproducible  supine position for radiation therapy.  CT images were obtained.  Surface markings were placed.  The CT images were loaded into the planning software.  Then the target and avoidance structures were contoured.  Treatment planning then occurred.  The radiation prescription was entered and confirmed.  Then, I designed and supervised the construction of a total of 3 medically necessary complex treatment devices.  I have requested : 3D Simulation  I have requested a DVH of the following structures: heart, lungs, lumpectomy cavity.  I have ordered:dose calc.  PLAN:  The patient will receive 50.4 Gy in 28 fractions followed by a boost to the lumpectomy cavity of 10 gray in 5 fractions.  -----------------------------------   Optical Surface Tracking Plan:  Since intensity modulated radiotherapy (IMRT) and 3D conformal radiation treatment methods are predicated on accurate and precise positioning for treatment, intrafraction motion monitoring is medically necessary to ensure accurate and  safe treatment delivery.  The ability to quantify intrafraction motion without excessive ionizing radiation dose can only be performed with optical surface tracking. Accordingly, surface imaging offers the opportunity to obtain 3D measurements of patient position throughout IMRT and 3D treatments without excessive radiation exposure.  I am ordering optical surface tracking for this patient's upcoming course of radiotherapy. ________________________________   Addendum: The patient is been on break for several days due to development of abscess within the breast. She has been on antibiotics and has had fluid drained from the breast by Dr. Donne Hazel.  on imaging the patient was noted to have a significant change in the breast contour. In light of this the patient underwent  Re-simulation today and will undergo re-planning for her last few treatments   Blair Promise, PhD, MD

## 2018-06-15 ENCOUNTER — Ambulatory Visit
Admission: RE | Admit: 2018-06-15 | Discharge: 2018-06-15 | Disposition: A | Discharge status: Home / Self Care | Payer: Medicare Other | Source: Ambulatory Visit | Attending: Radiation Oncology | Admitting: Radiation Oncology

## 2018-06-15 ENCOUNTER — Ambulatory Visit: Payer: Medicare Other

## 2018-06-15 DIAGNOSIS — C50411 Malignant neoplasm of upper-outer quadrant of right female breast: Secondary | ICD-10-CM | POA: Diagnosis not present

## 2018-06-16 ENCOUNTER — Ambulatory Visit
Admission: RE | Admit: 2018-06-16 | Discharge: 2018-06-16 | Disposition: A | Discharge status: Home / Self Care | Payer: Medicare Other | Source: Ambulatory Visit | Attending: Radiation Oncology | Admitting: Radiation Oncology

## 2018-06-16 DIAGNOSIS — C50411 Malignant neoplasm of upper-outer quadrant of right female breast: Secondary | ICD-10-CM | POA: Diagnosis not present

## 2018-06-17 ENCOUNTER — Encounter: Payer: Medicare Other | Admitting: Physical Therapy

## 2018-06-17 ENCOUNTER — Ambulatory Visit
Admission: RE | Admit: 2018-06-17 | Discharge: 2018-06-17 | Disposition: A | Discharge status: Home / Self Care | Payer: Medicare Other | Source: Ambulatory Visit | Attending: Radiation Oncology | Admitting: Radiation Oncology

## 2018-06-17 ENCOUNTER — Ambulatory Visit: Payer: Medicare Other

## 2018-06-17 DIAGNOSIS — C50411 Malignant neoplasm of upper-outer quadrant of right female breast: Secondary | ICD-10-CM | POA: Diagnosis not present

## 2018-06-20 ENCOUNTER — Ambulatory Visit
Admission: RE | Admit: 2018-06-20 | Discharge: 2018-06-20 | Disposition: A | Discharge status: Home / Self Care | Payer: Medicare Other | Source: Ambulatory Visit | Attending: Radiation Oncology | Admitting: Radiation Oncology

## 2018-06-20 DIAGNOSIS — C50411 Malignant neoplasm of upper-outer quadrant of right female breast: Secondary | ICD-10-CM | POA: Diagnosis not present

## 2018-06-21 ENCOUNTER — Telehealth: Payer: Self-pay | Admitting: Hematology and Oncology

## 2018-06-21 ENCOUNTER — Ambulatory Visit
Admission: RE | Admit: 2018-06-21 | Discharge: 2018-06-21 | Disposition: A | Discharge status: Home / Self Care | Payer: Medicare Other | Source: Ambulatory Visit | Attending: Radiation Oncology | Admitting: Radiation Oncology

## 2018-06-21 ENCOUNTER — Inpatient Hospital Stay: Payer: Medicare Other | Attending: Hematology and Oncology | Admitting: Hematology and Oncology

## 2018-06-21 DIAGNOSIS — Z170 Estrogen receptor positive status [ER+]: Secondary | ICD-10-CM | POA: Insufficient documentation

## 2018-06-21 DIAGNOSIS — C50411 Malignant neoplasm of upper-outer quadrant of right female breast: Secondary | ICD-10-CM | POA: Insufficient documentation

## 2018-06-21 NOTE — Telephone Encounter (Signed)
Called patient to make appt for 12/6 @ 1 pm with Dr. Burr Medico per request of [facility] (11/19 los).

## 2018-06-21 NOTE — Assessment & Plan Note (Addendum)
03/09/2018:Right lumpectomy: ILC grade 2, 2 foci, 2 cm, 2.5 cm, superior margin positive, 0/5 lymph nodes negative, ER 60%, PR 90%, Ki-67 1%, HER-2 negative T2N0 stage Ia Margin excision: Negative for malignancy Oncotype DX recurrence score 17: 5% risk of distant recurrence with hormone therapy alone Adjuvant radiation therapy 05/09/2018  Treatment plan: Adjuvant antiestrogen therapy after the conclusion of radiation therapy  Letrozole counseling: We discussed the risks and benefits of anti-estrogen therapy with aromatase inhibitors. These include but not limited to insomnia, hot flashes, mood changes, vaginal dryness, bone density loss, and weight gain. We strongly believe that the benefits far outweigh the risks. Patient understands these risks and consented to starting treatment. Planned treatment duration is 5-7 years.  Return to clinic in 3 months for survivorship care plan visit

## 2018-06-21 NOTE — Progress Notes (Signed)
Patient Care Team: Maury Dus, MD as PCP - General (Family Medicine) [name], MD as Consulting Physician (General Surgery) Truitt Merle, MD as Consulting Physician (Hematology) Gery Pray, MD as Consulting Physician (Radiation Oncology)  DIAGNOSIS:    ICD-10-CM   1. Malignant neoplasm of upper-outer quadrant of right breast in female, estrogen receptor positive ([place]) C50.411    Z17.0     SUMMARY OF ONCOLOGIC HISTORY:   Malignant neoplasm of upper-outer quadrant of right breast in female, estrogen receptor positive ([place])   12/28/2017 Mammogram    Bilateral diagnostic mammography with tomography and right breast ultrasonography at [facility] on 12/28/2017 showing: The irregular architectural distortion in the right breast upper outer quadrant posterior depth is indeterminate. The architectural distortion in the right breast upper outer quadrant middle depth is indeterminate.     01/03/2018 Pathology Results    Right needle core biopsy with pathology showing: Breast, right, needle core biopsy, (A) 11 o'clock with invasive mammary carcinoma, grade I-II. Breast, right, needle core biopsy, (B) 11 o'clock with invasive mammary carcinoma, grade I-II. Prognostic indicators significant for: ER, 60% positive with moderate staining intensity and PR, 90% positive with strong staining intensity. Proliferation marker Ki67 at 1%. HER2 negative.    01/04/2018 Cancer Staging    Staging form: Breast, AJCC 8th Edition - Clinical stage from 01/04/2018: Stage IA (cT1c, cN0, cM0, G2, ER+, PR+, HER2-) - Signed by Truitt Merle, MD on 01/11/2018    01/25/2018 Genetic Testing    The Multi-Cancer Panel offered by [facility] includes sequencing and/or deletion duplication testing of the following 83 genes: ALK, APC, ATM, AXIN2,BAP1,  BARD1, BLM, BMPR1A, BRCA1, BRCA2, BRIP1, CASR, CDC73, CDH1, CDK4, CDKN1B, CDKN1C, CDKN2A (p14ARF), CDKN2A (p16INK4a), CEBPA, CHEK2, CTNNA1, DICER1, DIS3L2, EGFR (c.2369C>T, p.Thr790Met variant  only), EPCAM (Deletion/duplication testing only), FH, FLCN, GATA2, GPC3, GREM1 (Promoter region deletion/duplication testing only), HOXB13 (c.251G>A, p.Gly84Glu), HRAS, KIT, MAX, MEN1, MET, MITF (c.952G>A, p.Glu318Lys variant only), MLH1, MSH2, MSH3, MSH6, MUTYH, NBN, NF1, NF2, NTHL1, PALB2, PDGFRA, PHOX2B, PMS2, POLD1, POLE, POT1, PRKAR1A, PTCH1, PTEN, RAD50, RAD51C, RAD51D, RB1, RECQL4, RET, RUNX1, SDHAF2, SDHA (sequence changes only), SDHB, SDHC, SDHD, SMAD4, SMARCA4, SMARCB1, SMARCE1, STK11, SUFU, TERC, TERT, TMEM127, TP53, TSC1, TSC2, VHL, WRN and WT1.   Results: No pathogenic variants identified.  A Variant of Uncertain significance in BAP1 was identified c.1066C>T (p.Arg356Trp).  The date of this test report is 01/25/2018.     03/09/2018 Surgery    Right lumpectomy: ILC grade 2, 2 foci, 2 cm, 2.5 cm, superior margin positive, 0/5 lymph nodes negative, ER 60%, PR 90%, Ki-67 1%, HER-2 negative T2N0 stage Ia    03/09/2018 Oncotype testing    Recurrence score 17 Distant risk of recurrence at 9 years with AI or Tamoxifen alone is 5% There is a <1% benefit of chemotherapy    03/31/2018 Pathology Results    Reexcision: No residual cancer    05/09/2018 -  Radiation Therapy    Radiation with Dr. Sondra Come 05/09/18- 07/08/18     06/21/2018 Cancer Staging    Staging form: Breast, AJCC 8th Edition - Pathologic: Stage IA (pT2, pN0, cM0, G2, ER+, PR+, HER2-) - Signed by Nicholas Lose, MD on 06/21/2018     CHIEF COMPLIANT: F/u of radiation and right breast cancer post surgeries   INTERVAL HISTORY: Lisa Horton is a 72 y.o. with above-mentioned history of right breast cancer treated with right lumpectomy, re-excision and currently adjuvant radiation. She is under the care of Dr. Burr Medico and I am seeing  her today. She presents to the clinic today with her sister. She had issues with her surgical incision healing, due to draining. She is tolerating radiation moderately well and plans to complete radiation on  07/08/18. I discussed anti-estrogen therapy options with her and she will discuss further with Dr. Burr Medico. She notes she may have osteopenia. The patient notes arthritis and joint pain in knees, as well as swelling in her left ankle due to myopathy.    REVIEW OF SYSTEMS:   Constitutional: Denies fevers, chills or abnormal weight loss Eyes: Denies blurriness of vision Ears, nose, mouth, throat, and face: Denies mucositis or sore throat Respiratory: Denies cough, dyspnea or wheezes Cardiovascular: Denies palpitation, chest discomfort Gastrointestinal:  Denies nausea, heartburn or change in bowel habits Skin: Denies abnormal skin rashes MSK: (+) joint pain, knees (+) myopathy in left ankle Lymphatics: Denies new lymphadenopathy or easy bruising Neurological:Denies numbness, tingling or new weaknesses Behavioral/Psych: Mood is stable, no new changes  Extremities: No lower extremity edema Breast: denies any pain or lumps or nodules in either breasts All other systems were reviewed with the patient and are negative.  I have reviewed the past medical history, past surgical history, social history and family history with the patient and they are unchanged from previous note.  ALLERGIES:  is allergic to statins; adhesive [tape]; hydrogen peroxide; lidocaine; metrogel [metronidazole]; latex; neomycin-bacitracin zn-polymyx; sulfa antibiotics; and sulfonamide derivatives.  MEDICATIONS:  Current Outpatient Medications  Medication Sig Dispense Refill  . Albuterol Sulfate (PROAIR RESPICLICK) 124 (90 Base) MCG/ACT AEPB Inhale 2 puffs into the lungs every 4 (four) hours as needed. 1 each 0  . CALCIUM PO Take 1 tablet by mouth daily. Reported on 01/06/2016    . cetirizine (ZYRTEC) 10 MG tablet Take 10 mg by mouth daily.      . fluticasone furoate-vilanterol (BREO ELLIPTA) 200-25 MCG/INH AEPB Inhale 1 puff into the lungs daily. 30 each 5  . irbesartan (AVAPRO) 300 MG tablet Take 300 mg by mouth daily.     Marland Kitchen  levothyroxine (SYNTHROID, LEVOTHROID) 112 MCG tablet Take 1 tablet by mouth daily.    . Multiple Vitamins-Minerals (MULTIVITAMINS THER. W/MINERALS) TABS Take 1 tablet by mouth daily.    Marland Kitchen omeprazole (PRILOSEC) 40 MG capsule Take 1 capsule by mouth daily.    Marland Kitchen oxyCODONE (OXY IR/ROXICODONE) 5 MG immediate release tablet Take 1 tablet (5 mg total) by mouth every 6 (six) hours as needed for moderate pain, severe pain or breakthrough pain. 10 tablet 0  . traMADol (ULTRAM) 50 MG tablet Take 2 tablets (100 mg total) by mouth every 6 (six) hours as needed. 10 tablet 0  . Vitamin D, Ergocalciferol, (DRISDOL) 50000 units CAPS capsule Take 1 capsule by mouth once a week.  3   No current facility-administered medications for this visit.     PHYSICAL EXAMINATION: ECOG PERFORMANCE STATUS: 1 - Symptomatic but completely ambulatory  Vitals:   06/21/18 0900  BP: (!) 155/76  Pulse: 84  Resp: 17  Temp: 98.1 F (36.7 C)  SpO2: 99%   Filed Weights   06/21/18 0900  Weight: 219 lb 11.2 oz (99.7 kg)    GENERAL:alert, no distress and comfortable SKIN: skin color, texture, turgor are normal, no rashes or significant lesions EYES: normal, Conjunctiva are pink and non-injected, sclera clear OROPHARYNX:no exudate, no erythema and lips, buccal mucosa, and tongue normal  NECK: supple, thyroid normal size, non-tender, without nodularity LYMPH:  no palpable lymphadenopathy in the cervical, axillary or inguinal LUNGS: clear to  auscultation and percussion with normal breathing effort HEART: regular rate & rhythm and no murmurs and no lower extremity edema ABDOMEN:abdomen soft, non-tender and normal bowel sounds MUSCULOSKELETAL:no cyanosis of digits and no clubbing  NEURO: alert & oriented x 3 with fluent speech, no focal motor/sensory deficits EXTREMITIES: No lower extremity edema  LABORATORY DATA:  I have reviewed the data as listed CMP Latest Ref Rng & Units 01/12/2018 03/10/2011 08/30/2009  Glucose 70 - 140  mg/dL 121 103(H) 97  BUN 7 - 26 mg/dL '15 12 13  ' Creatinine 0.60 - 1.10 mg/dL 1.07 1.02 0.98  Sodium 136 - 145 mmol/L 140 141 136  Potassium 3.5 - 5.1 mmol/L 4.5 4.1 4.1  Chloride 98 - 109 mmol/L 106 104 101  CO2 22 - 29 mmol/L '27 28 29  ' Calcium 8.4 - 10.4 mg/dL 9.3 9.8 9.5  Total Protein 6.4 - 8.3 g/dL 8.0 7.6 7.6  Total Bilirubin 0.2 - 1.2 mg/dL 0.4 0.4 1.0  Alkaline Phos 40 - 150 U/L 58 64 63  AST 5 - 34 U/L 20 29 38(H)  ALT 0 - 55 U/L 21 28 40(H)    Lab Results  Component Value Date   WBC 5.4 01/12/2018   HGB 12.8 01/12/2018   HCT 39.7 01/12/2018   MCV 92.2 01/12/2018   PLT 171 01/12/2018   NEUTROABS 3.1 01/12/2018    ASSESSMENT & PLAN:  Malignant neoplasm of upper-outer quadrant of right breast in female, estrogen receptor positive ([place]) 03/09/2018:Right lumpectomy: ILC grade 2, 2 foci, 2 cm, 2.5 cm, superior margin positive, 0/5 lymph nodes negative, ER 60%, PR 90%, Ki-67 1%, HER-2 negative T2N0 stage Ia Margin excision: Negative for malignancy Oncotype DX recurrence score 17: 5% risk of distant recurrence with hormone therapy alone Adjuvant radiation therapy 05/09/2018  Treatment plan: Adjuvant antiestrogen therapy after the conclusion of radiation therapy  Letrozole counseling: We discussed the risks and benefits of anti-estrogen therapy with aromatase inhibitors. These include but not limited to insomnia, hot flashes, mood changes, vaginal dryness, bone density loss, and weight gain. We strongly believe that the benefits far outweigh the risks. Patient understands these risks and consented to starting treatment. Planned treatment duration is 5-7 years.  Patient was placed on my schedule by mistake. She will be seen by Dr. Burr Medico on the last day of radiation on 07/08/2018.    No orders of the defined types were placed in this encounter.  The patient has a good understanding of the overall plan. she agrees with it. she will call with any problems that may develop before the  next visit here.  Nicholas Lose, MD 06/21/2018   I, Cloyde Reams Dorshimer, am acting as scribe for Nicholas Lose, MD.  I have reviewed the above documentation for accuracy and completeness, and I agree with the above.

## 2018-06-22 ENCOUNTER — Ambulatory Visit: Payer: Medicare Other

## 2018-06-22 ENCOUNTER — Ambulatory Visit
Admission: RE | Admit: 2018-06-22 | Discharge: 2018-06-22 | Disposition: A | Discharge status: Home / Self Care | Payer: Medicare Other | Source: Ambulatory Visit | Attending: Radiation Oncology | Admitting: Radiation Oncology

## 2018-06-22 DIAGNOSIS — C50411 Malignant neoplasm of upper-outer quadrant of right female breast: Secondary | ICD-10-CM | POA: Diagnosis not present

## 2018-06-23 ENCOUNTER — Ambulatory Visit: Payer: Medicare Other

## 2018-06-23 ENCOUNTER — Ambulatory Visit
Admission: RE | Admit: 2018-06-23 | Discharge: 2018-06-23 | Disposition: A | Discharge status: Home / Self Care | Payer: Medicare Other | Source: Ambulatory Visit | Attending: Radiation Oncology | Admitting: Radiation Oncology

## 2018-06-23 DIAGNOSIS — C50411 Malignant neoplasm of upper-outer quadrant of right female breast: Secondary | ICD-10-CM | POA: Diagnosis not present

## 2018-06-24 ENCOUNTER — Ambulatory Visit: Payer: Medicare Other

## 2018-06-24 ENCOUNTER — Ambulatory Visit
Admission: RE | Admit: 2018-06-24 | Discharge: 2018-06-24 | Disposition: A | Discharge status: Home / Self Care | Payer: Medicare Other | Source: Ambulatory Visit | Attending: Radiation Oncology | Admitting: Radiation Oncology

## 2018-06-24 DIAGNOSIS — C50411 Malignant neoplasm of upper-outer quadrant of right female breast: Secondary | ICD-10-CM | POA: Diagnosis not present

## 2018-06-26 ENCOUNTER — Ambulatory Visit
Admission: RE | Admit: 2018-06-26 | Discharge: 2018-06-26 | Disposition: A | Discharge status: Home / Self Care | Payer: Medicare Other | Source: Ambulatory Visit | Attending: Radiation Oncology | Admitting: Radiation Oncology

## 2018-06-26 DIAGNOSIS — C50411 Malignant neoplasm of upper-outer quadrant of right female breast: Secondary | ICD-10-CM | POA: Diagnosis not present

## 2018-06-27 ENCOUNTER — Ambulatory Visit: Payer: Medicare Other

## 2018-06-27 ENCOUNTER — Ambulatory Visit
Admission: RE | Admit: 2018-06-27 | Discharge: 2018-06-27 | Disposition: A | Discharge status: Home / Self Care | Payer: Medicare Other | Source: Ambulatory Visit | Attending: Radiation Oncology | Admitting: Radiation Oncology

## 2018-06-27 DIAGNOSIS — C50411 Malignant neoplasm of upper-outer quadrant of right female breast: Secondary | ICD-10-CM | POA: Diagnosis not present

## 2018-06-28 ENCOUNTER — Ambulatory Visit: Payer: Medicare Other

## 2018-06-28 ENCOUNTER — Ambulatory Visit
Admission: RE | Admit: 2018-06-28 | Discharge: 2018-06-28 | Disposition: A | Discharge status: Home / Self Care | Payer: Medicare Other | Source: Ambulatory Visit | Attending: Radiation Oncology | Admitting: Radiation Oncology

## 2018-06-28 DIAGNOSIS — Z170 Estrogen receptor positive status [ER+]: Principal | ICD-10-CM

## 2018-06-28 DIAGNOSIS — C50411 Malignant neoplasm of upper-outer quadrant of right female breast: Secondary | ICD-10-CM

## 2018-06-28 MED ORDER — SONAFINE EX EMUL
1.0000 "application " | Freq: Two times a day (BID) | CUTANEOUS | Status: DC
  Administered 2018-06-28: 1 via TOPICAL

## 2018-06-29 ENCOUNTER — Ambulatory Visit
Admission: RE | Admit: 2018-06-29 | Discharge: 2018-06-29 | Disposition: A | Discharge status: Home / Self Care | Payer: Medicare Other | Source: Ambulatory Visit | Attending: Radiation Oncology | Admitting: Radiation Oncology

## 2018-06-29 ENCOUNTER — Ambulatory Visit: Payer: Medicare Other

## 2018-06-29 DIAGNOSIS — C50411 Malignant neoplasm of upper-outer quadrant of right female breast: Secondary | ICD-10-CM | POA: Diagnosis not present

## 2018-06-30 ENCOUNTER — Ambulatory Visit: Payer: Medicare Other

## 2018-07-01 ENCOUNTER — Ambulatory Visit: Payer: Medicare Other

## 2018-07-04 ENCOUNTER — Ambulatory Visit: Payer: Medicare Other

## 2018-07-04 ENCOUNTER — Ambulatory Visit
Admission: RE | Admit: 2018-07-04 | Discharge: 2018-07-04 | Disposition: A | Discharge status: Home / Self Care | Payer: Medicare Other | Source: Ambulatory Visit | Attending: Radiation Oncology | Admitting: Radiation Oncology

## 2018-07-04 DIAGNOSIS — C50411 Malignant neoplasm of upper-outer quadrant of right female breast: Secondary | ICD-10-CM | POA: Insufficient documentation

## 2018-07-04 DIAGNOSIS — Z510 Encounter for antineoplastic radiation therapy: Secondary | ICD-10-CM | POA: Diagnosis not present

## 2018-07-04 DIAGNOSIS — Z170 Estrogen receptor positive status [ER+]: Secondary | ICD-10-CM | POA: Insufficient documentation

## 2018-07-05 ENCOUNTER — Ambulatory Visit
Admission: RE | Admit: 2018-07-05 | Discharge: 2018-07-05 | Disposition: A | Discharge status: Home / Self Care | Payer: Medicare Other | Source: Ambulatory Visit | Attending: Radiation Oncology | Admitting: Radiation Oncology

## 2018-07-05 DIAGNOSIS — C50411 Malignant neoplasm of upper-outer quadrant of right female breast: Secondary | ICD-10-CM | POA: Diagnosis not present

## 2018-07-06 ENCOUNTER — Ambulatory Visit
Admission: RE | Admit: 2018-07-06 | Discharge: 2018-07-06 | Disposition: A | Discharge status: Home / Self Care | Payer: Medicare Other | Source: Ambulatory Visit | Attending: Radiation Oncology | Admitting: Radiation Oncology

## 2018-07-06 ENCOUNTER — Ambulatory Visit: Payer: Medicare Other

## 2018-07-06 DIAGNOSIS — C50411 Malignant neoplasm of upper-outer quadrant of right female breast: Secondary | ICD-10-CM | POA: Diagnosis not present

## 2018-07-07 ENCOUNTER — Ambulatory Visit: Payer: Medicare Other

## 2018-07-07 ENCOUNTER — Ambulatory Visit
Admission: RE | Admit: 2018-07-07 | Discharge: 2018-07-07 | Disposition: A | Discharge status: Home / Self Care | Payer: Medicare Other | Source: Ambulatory Visit | Attending: Radiation Oncology | Admitting: Radiation Oncology

## 2018-07-07 DIAGNOSIS — C50411 Malignant neoplasm of upper-outer quadrant of right female breast: Secondary | ICD-10-CM | POA: Diagnosis not present

## 2018-07-08 ENCOUNTER — Ambulatory Visit: Payer: Medicare Other

## 2018-07-08 ENCOUNTER — Inpatient Hospital Stay: Payer: Medicare Other | Attending: Hematology and Oncology | Admitting: Hematology

## 2018-07-08 ENCOUNTER — Encounter: Payer: Self-pay | Admitting: Hematology

## 2018-07-08 VITALS — BP 145/79 | HR 87 | Temp 98.3°F | Resp 18 | Ht 63.0 in | Wt 217.1 lb

## 2018-07-08 DIAGNOSIS — Z79811 Long term (current) use of aromatase inhibitors: Secondary | ICD-10-CM | POA: Diagnosis not present

## 2018-07-08 DIAGNOSIS — Z170 Estrogen receptor positive status [ER+]: Secondary | ICD-10-CM | POA: Diagnosis not present

## 2018-07-08 DIAGNOSIS — C50411 Malignant neoplasm of upper-outer quadrant of right female breast: Secondary | ICD-10-CM | POA: Diagnosis present

## 2018-07-08 DIAGNOSIS — M8580 Other specified disorders of bone density and structure, unspecified site: Secondary | ICD-10-CM | POA: Diagnosis not present

## 2018-07-08 MED ORDER — ANASTROZOLE 1 MG PO TABS: 1.0000 mg | ORAL_TABLET | Freq: Every day | ORAL | 5 refills | Status: DC

## 2018-07-08 NOTE — Progress Notes (Signed)
[facility]   [phone] [phone]   Clinic Follow up Note   Patient Care Team: Reade, Robert, MD as PCP - General (Family Medicine) Toth, Paul III, MD as Consulting Physician (General Surgery) Feng, Yan, MD as Consulting Physician (Hematology) Kinard, James, MD as Consulting Physician (Radiation Oncology)  Date of Service:  07/08/2018  CHIEF COMPLAINT: F/u of right breast cancer  SUMMARY OF ONCOLOGIC HISTORY: Oncology History   Cancer Staging Malignant neoplasm of upper-outer quadrant of right breast in female, estrogen receptor positive ([facility]) Staging form: Breast, AJCC 8th Edition - Clinical stage from 01/04/2018: Stage IA (cT1c, cN0, cM0, G2, ER+, PR+, HER2-) - Signed by Feng, Yan, MD on 01/11/2018 - Pathologic: Stage IA (pT2, pN0, cM0, G2, ER+, PR+, HER2-) - Signed by Gudena, Vinay, MD on 06/21/2018       Malignant neoplasm of upper-outer quadrant of right breast in female, estrogen receptor positive ([facility])   12/28/2017 Mammogram    Bilateral diagnostic mammography with tomography and right breast ultrasonography at [facility] on 12/28/2017 showing: The irregular architectural distortion in the right breast upper outer quadrant posterior depth is indeterminate. The architectural distortion in the right breast upper outer quadrant middle depth is indeterminate.     01/03/2018 Pathology Results    Right needle core biopsy with pathology showing: Breast, right, needle core biopsy, (A) 11 o'clock with invasive mammary carcinoma, grade I-II. Breast, right, needle core biopsy, (B) 11 o'clock with invasive mammary carcinoma, grade I-II. Prognostic indicators significant for: ER, 60% positive with moderate staining intensity and PR, 90% positive with strong staining intensity. Proliferation marker Ki67 at 1%. HER2 negative.    01/04/2018 Cancer Staging    Staging form: Breast, AJCC 8th Edition - Clinical stage from 01/04/2018: Stage IA (cT1c, cN0, cM0, G2, ER+, PR+,  HER2-) - Signed by Feng, Yan, MD on 01/11/2018    01/25/2018 Genetic Testing    The Multi-Cancer Panel offered by [facility] includes sequencing and/or deletion duplication testing of the following 83 genes: ALK, APC, ATM, AXIN2,BAP1,  BARD1, BLM, BMPR1A, BRCA1, BRCA2, BRIP1, CASR, CDC73, CDH1, CDK4, CDKN1B, CDKN1C, CDKN2A (p14ARF), CDKN2A (p16INK4a), CEBPA, CHEK2, CTNNA1, DICER1, DIS3L2, EGFR (c.2369C>T, p.Thr790Met variant only), EPCAM (Deletion/duplication testing only), FH, FLCN, GATA2, GPC3, GREM1 (Promoter region deletion/duplication testing only), HOXB13 (c.251G>A, p.Gly84Glu), HRAS, KIT, MAX, MEN1, MET, MITF (c.952G>A, p.Glu318Lys variant only), MLH1, MSH2, MSH3, MSH6, MUTYH, NBN, NF1, NF2, NTHL1, PALB2, PDGFRA, PHOX2B, PMS2, POLD1, POLE, POT1, PRKAR1A, PTCH1, PTEN, RAD50, RAD51C, RAD51D, RB1, RECQL4, RET, RUNX1, SDHAF2, SDHA (sequence changes only), SDHB, SDHC, SDHD, SMAD4, SMARCA4, SMARCB1, SMARCE1, STK11, SUFU, TERC, TERT, TMEM127, TP53, TSC1, TSC2, VHL, WRN and WT1.   Results: No pathogenic variants identified.  A Variant of Uncertain significance in BAP1 was identified c.1066C>T (p.Arg356Trp).  The date of this test report is 01/25/2018.     03/09/2018 Surgery    Right lumpectomy: ILC grade 2, 2 foci, 2 cm, 2.5 cm, superior margin positive, 0/5 lymph nodes negative, ER 60%, PR 90%, Ki-67 1%, HER-2 negative T2N0 stage Ia    03/09/2018 Oncotype testing    Recurrence score 17 Distant risk of recurrence at 9 years with AI or Tamoxifen alone is 5% There is a <1% benefit of chemotherapy    03/31/2018 Pathology Results    Reexcision: No residual cancer    05/09/2018 - 07/08/2018 Radiation Therapy    Radiation with Dr. Kinard 05/09/18- 07/08/18     06/21/2018 Cancer Staging    Staging form: Breast, AJCC 8th Edition - Pathologic:   Stage IA (pT2, pN0, cM0, G2, ER+, PR+, HER2-) - Signed by Nicholas Lose, MD on 06/21/2018    07/2018 -  Anti-estrogen oral therapy    Anastrozole 35m daily starting 07/2018       CURRENT THERAPY:  Anastrozole 132mdaily starting in a few weeks   INTERVAL HISTORY:  Lisa Horton here for a follow up of right breast cancer post radiation. She presents to the clinic today by herself. She notes she has tolerated radiation. She notes skin rawness but manageable and tries to keep it moisturized. She notes she only experienced fatigue last week. She notes she was getting constipated occasionally with treatment. She notes developing a black spot on her tongue which has resolved.  She notes she has mild, occasional joint pain. She has annual spells of Myopathy which is manageable. She notes she had to stop PT due to surgical incision infection. She plans to restart to PT soon.      REVIEW OF SYSTEMS:   Constitutional: Denies fevers, chills or abnormal weight loss Eyes: Denies blurriness of vision Ears, nose, mouth, throat, and face: Denies mucositis or sore throat Respiratory: Denies cough, dyspnea or wheezes Cardiovascular: Denies palpitation, chest discomfort or lower extremity swelling Gastrointestinal:  Denies nausea, heartburn or change in bowel habits Skin: Denies abnormal skin rashes MSK: (+) occasaionlll mild joint pain and myopathy Lymphatics: Denies new lymphadenopathy or easy bruising Neurological:Denies numbness, tingling or new weaknesses Behavioral/Psych: Mood is stable, no new changes  Breast: (+) skin rawness of right breast  All other systems were reviewed with the patient and are negative.  MEDICAL HISTORY:  Past Medical History:  Diagnosis Date  . Acid reflux   . Allergic rhinitis   . Arthritis   . Asthma   . Breast lump   . Bruises easily   . Cancer [place]  . Chicken pox   . Colon polyp   . Family history of breast cancer   . Family history of pancreatic cancer   . Family history of prostate cancer   . Gum disease   . Hypertension   . Hypothyroidism   . Incontinence   . Measles   . Myopathy    arms and legs, statin drug  related  . Night sweats   . Nipple discharge   . Pre-diabetes   . Prediabetes 06/2014    SURGICAL HISTORY: Past Surgical History:  Procedure Laterality Date  . ABDOMINAL HYSTERECTOMY  1986   secondary to fibroids  . BREAST LUMPECTOMY WITH RADIOACTIVE SEED AND SENTINEL LYMPH NODE BIOPSY Right 03/09/2018   Procedure: RIGHT BREAST LUMPECTOMY WITH BRACKETED RADIOACTIVE SEED AND SENTINEL LYMPH NODE BIOPSY;  Surgeon: WaRolm BookbinderMD;  [facility];  Laterality: Right;  . BREAST SURGERY  07/09/2008   mass removal  . CHOLECYSTECTOMY  03/17/11  . RE-EXCISION OF BREAST LUMPECTOMY Right 03/31/2018   Procedure: RE-EXCISION OF BREAST LUMPECTOMY;  Surgeon: WaRolm BookbinderMD;  [facility];  Laterality: Right;  . SKIN TAG REMOVAL     brow and lid  . THIGH / KNEE SOFT TISSUE BIOPSY  09/05/2009  . TUBAL LIGATION  1980    I have reviewed the social history and family history with the patient and they are unchanged from previous note.  ALLERGIES:  is allergic to statins; adhesive [tape]; hydrogen peroxide; lidocaine; metrogel [metronidazole]; latex; neomycin-bacitracin zn-polymyx; sulfa antibiotics; and sulfonamide derivatives.  MEDICATIONS:  Current Outpatient Medications  Medication  Sig Dispense Refill  . Albuterol Sulfate (PROAIR RESPICLICK) 108 (90 Base) MCG/ACT AEPB Inhale 2 puffs into the lungs every 4 (four) hours as needed. 1 each 0  . CALCIUM PO Take 1 tablet by mouth daily. Reported on 01/06/2016    . cetirizine (ZYRTEC) 10 MG tablet Take 10 mg by mouth daily.      . fluticasone furoate-vilanterol (BREO ELLIPTA) 200-25 MCG/INH AEPB Inhale 1 puff into the lungs daily. 30 each 5  . irbesartan (AVAPRO) 300 MG tablet Take 300 mg by mouth daily.     . levothyroxine (SYNTHROID, LEVOTHROID) 112 MCG tablet Take 1 tablet by mouth daily.    . Multiple Vitamins-Minerals (MULTIVITAMINS THER. W/MINERALS) TABS Take  1 tablet by mouth daily.    . omeprazole (PRILOSEC) 40 MG capsule Take 1 capsule by mouth daily.    . oxyCODONE (OXY IR/ROXICODONE) 5 MG immediate release tablet Take 1 tablet (5 mg total) by mouth every 6 (six) hours as needed for moderate pain, severe pain or breakthrough pain. 10 tablet 0  . traMADol (ULTRAM) 50 MG tablet Take 2 tablets (100 mg total) by mouth every 6 (six) hours as needed. 10 tablet 0  . Vitamin D, Ergocalciferol, (DRISDOL) 50000 units CAPS capsule Take 1 capsule by mouth once a week.  3  . anastrozole (ARIMIDEX) 1 MG tablet Take 1 tablet (1 mg total) by mouth daily. 30 tablet 5   No current facility-administered medications for this visit.     PHYSICAL EXAMINATION: ECOG PERFORMANCE STATUS: 1 - Symptomatic but completely ambulatory  Vitals:   07/08/18 1315  BP: (!) 145/79  Pulse: 87  Resp: 18  Temp: 98.3 F (36.8 C)  SpO2: 98%   Filed Weights   07/08/18 1315  Weight: 217 lb 1.6 oz (98.5 kg)    GENERAL:alert, no distress and comfortable SKIN: skin color, texture, turgor are normal, no rashes or significant lesions EYES: normal, Conjunctiva are pink and non-injected, sclera clear OROPHARYNX:no exudate, no erythema and lips, buccal mucosa, and tongue normal  NECK: supple, thyroid normal size, non-tender, without nodularity LYMPH:  no palpable lymphadenopathy in the cervical, axillary or inguinal LUNGS: clear to auscultation and percussion with normal breathing effort HEART: regular rate & rhythm and no murmurs and no lower extremity edema ABDOMEN:abdomen soft, non-tender and normal bowel sounds Musculoskeletal:no cyanosis of digits and no clubbing  NEURO: alert & oriented x 3 with fluent speech, no focal motor/sensory deficits BREAST: (+) S/p right lumpectomy and re-excision: Surgical incisions healing well (+) scar tissue in 10:00 position of right breast  (+) diffuse skin hyperpigmentation of right breast and axilla (+) right breast lymphedema from surgery  and radiation with tenderness   LABORATORY DATA:  I have reviewed the data as listed CBC Latest Ref Rng & Units 01/12/2018 11/28/2015 03/10/2011  WBC 3.9 - 10.3 K/uL 5.4 8.1 5.8  Hemoglobin 11.6 - 15.9 g/dL 12.8 13.4 13.2  Hematocrit 34.8 - 46.6 % 39.7 40.8 40.1  Platelets 145 - 400 K/uL 171 203.0 154     CMP Latest Ref Rng & Units 01/12/2018 03/10/2011 08/30/2009  Glucose 70 - 140 mg/dL 121 103(H) 97  BUN 7 - 26 mg/dL 15 12 13  Creatinine 0.60 - 1.10 mg/dL 1.07 1.02 0.98  Sodium 136 - 145 mmol/L 140 141 136  Potassium 3.5 - 5.1 mmol/L 4.5 4.1 4.1  Chloride 98 - 109 mmol/L 106 104 101  CO2 22 - 29 mmol/L 27 28 29  Calcium 8.4 - 10.4 mg/dL 9.3 9.8   9.5  Total Protein 6.4 - 8.3 g/dL 8.0 7.6 7.6  Total Bilirubin 0.2 - 1.2 mg/dL 0.4 0.4 1.0  Alkaline Phos 40 - 150 U/L 58 64 63  AST 5 - 34 U/L 20 29 38(H)  ALT 0 - 55 U/L 21 28 40(H)      RADIOGRAPHIC STUDIES: I have personally reviewed the radiological images as listed and agreed with the findings in the report. No results found.   ASSESSMENT & PLAN:  Lisa Horton is a 71 y.o. female with    1.  Malignant neoplasm of upper outer quadrant of right breast, invasive lobular carcinoma, Stage IA, cT1cN0M0, G2, ER (+), PR (+), HER2 negative -She was diagnosed in 01/2018. She is s/p right breast lumpectomy and adjuvant radiation.  -I reviewed with patient the surgical pathology reports from both surgeries. She had complete resection and no positive LN. -Her Oncotype score showed low risk with recurrence score 17. I do not recommend adjuvant chemotherapy.  -Given the strong ER and PR expression in her postmenopausal status, I recommend adjuvant endocrine therapy with aromatase inhibitor Anastrozole to reduce the risk of cancer recurrence. Potential benefits and side effects were discussed with patient and she is interested. Due to her lobular histology, I recommend 7 to 10 years therapy if she tolerates well.  ---The potential side effects,  which includes but not limited to, hot flash, skin and vaginal dryness, joint stiffness were discussed with her in great details. Preventive strategies such as being physically active, healthy diet and watch her weight, ect, were reviewed with her.  She voiced good understanding, and agreed to proceed. -I prescribed Anastrozole today for her to start in 2-3 weeks.  -I recommend she restart PT soon for her right breast lymphedema.  -We also discussed the breast cancer surveillance after her surgery. She will continue annual screening mammogram, self exam, and a routine office visit with lab and exam with us. -I offered her the chance to attend survivorship clinic. She is interested. She will see Lacie in 6 months  -F/u with me in 3 months    2. Osteopenia -Her last DEXA showed Osteopenia with lowest T-Score at -2.2 at Right Femur Neck  -I also discussed letrozole can weaken her bone. Will monitor with repeat scan every 2 years. -I encouraged her to start Calcium and Vit D.     PLAN: -I prescribed Anastrozole today to start in 3 weeks, after the Holidays  -Lab and f/u in 3 months     No problem-specific Assessment & Plan notes found for this encounter.   No orders of the defined types were placed in this encounter.  All questions were answered. The patient knows to call the clinic with any problems, questions or concerns. No barriers to learning was detected. I spent 20 minutes counseling the patient face to face. The total time spent in the appointment was 25 minutes and more than 50% was on counseling and review of test results     Yan Feng, MD 07/08/2018   I, Amoya Bennett, am acting as scribe for Yan Feng, MD.   I have reviewed the above documentation for accuracy and completeness, and I agree with the above.       

## 2018-07-11 ENCOUNTER — Encounter: Payer: Self-pay | Admitting: Radiation Oncology

## 2018-07-11 ENCOUNTER — Telehealth: Payer: Self-pay | Admitting: Hematology

## 2018-07-11 NOTE — Progress Notes (Signed)
  Radiation Oncology         [phone] ________________________________  Name: Lisa Horton MRN: [id number]  Date: 07/11/2018  DOB: 09-11-1945   End of Treatment Note  Diagnosis:    Malignant neoplasm of UOQ right breast  Stage IA (pT2, pN0, cM0, G2, ER+, PR+, HER2-)   Indication for treatment:  Curative       Radiation treatment dates:   1. 05/09/18-05/27/18, patient was on a 18 day break in treatment after developing a seroma and breast infection,  06/13/18-06/28/18      2. 06/29/18-07/07/18   Site/dose:   1. Right breast; 50.4 Gy total in 28 fractions of 1.8 Gy           2. Boost; 2 Gy in 5 fractions for a total of 10 Gy  Beams/energy:   1. 3D; 10X, 15X         2. 3D; 10X, 15X   Narrative: The patient tolerated radiation treatment relatively well.   She developed a seroma that required draining during her initial phase of radiation. She took an 18 day break in her treatment following an infection. She resumed and had replanning on 06/13/18.  Patient reported fatigue, occasional sharp shooting pain in her breast as well as soreness. She developed skin peeling under her breast and hyperpigmentation. She used radiaplex and aquaphor for her symptoms.  Towards the end of treatment, pt reported worsening fatigue.   Plan: The patient has completed radiation treatment. The patient will return to radiation oncology clinic for routine followup in one month. I advised them to call or return sooner if they have any questions or concerns related to their recovery or treatment.  -----------------------------------  Blair Promise, PhD, MD  This document serves as a record of services personally performed by Gery Pray, MD. It was created on his behalf by Mary-Margaret Loma Messing, a trained medical scribe. The creation of this record is based on the scribe's personal observations and the provider's statements to them. This document has been checked and approved by the attending provider.

## 2018-07-11 NOTE — Telephone Encounter (Signed)
Called patient and scheduled appointments per 12/06 los.  Printed and mailed calendar

## 2018-07-21 ENCOUNTER — Other Ambulatory Visit: Payer: Self-pay | Admitting: General Surgery

## 2018-07-21 DIAGNOSIS — N6320 Unspecified lump in the left breast, unspecified quadrant: Secondary | ICD-10-CM

## 2018-07-29 ENCOUNTER — Ambulatory Visit: Payer: Medicare Other | Attending: General Surgery

## 2018-07-29 DIAGNOSIS — R293 Abnormal posture: Secondary | ICD-10-CM | POA: Diagnosis present

## 2018-07-29 DIAGNOSIS — Z170 Estrogen receptor positive status [ER+]: Secondary | ICD-10-CM | POA: Insufficient documentation

## 2018-07-29 DIAGNOSIS — I890 Lymphedema, not elsewhere classified: Secondary | ICD-10-CM | POA: Diagnosis present

## 2018-07-29 DIAGNOSIS — L599 Disorder of the skin and subcutaneous tissue related to radiation, unspecified: Secondary | ICD-10-CM | POA: Diagnosis present

## 2018-07-29 DIAGNOSIS — M6281 Muscle weakness (generalized): Secondary | ICD-10-CM | POA: Diagnosis present

## 2018-07-29 DIAGNOSIS — M25611 Stiffness of right shoulder, not elsewhere classified: Secondary | ICD-10-CM | POA: Insufficient documentation

## 2018-07-29 DIAGNOSIS — C50411 Malignant neoplasm of upper-outer quadrant of right female breast: Secondary | ICD-10-CM | POA: Diagnosis present

## 2018-07-29 NOTE — Therapy (Addendum)
[place], [place], [address] Phone: [phone]   Fax:  [phone]  Physical Therapy Treatment  Patient Details  Name: Lisa Horton MRN: [id number] Date of Birth: 09-18-1945 Referring Provider (PT): Dr. Donne Hazel   Encounter Date: 07/29/2018  PT End of Session - 07/29/18 1206    Visit Number  4    Number of Visits  9    Date for PT Re-Evaluation  06/14/18   D/C this visit   PT Start Time  1105    PT Stop Time  1202    PT Time Calculation (min)  57 min    Activity Tolerance  Patient tolerated treatment well    Behavior During Therapy  [facility] for tasks assessed/performed       Past Medical History:  Diagnosis Date  . Acid reflux   . Allergic rhinitis   . Arthritis   . Asthma   . Breast lump   . Bruises easily   . Cancer ([place])   . Chicken pox   . Colon polyp   . Family history of breast cancer   . Family history of pancreatic cancer   . Family history of prostate cancer   . Gum disease   . Hypertension   . Hypothyroidism   . Incontinence   . Measles   . Myopathy    arms and legs, statin drug related  . Night sweats   . Nipple discharge   . Pre-diabetes   . Prediabetes 06/2014    Past Surgical History:  Procedure Laterality Date  . ABDOMINAL HYSTERECTOMY  1986   secondary to fibroids  . BREAST LUMPECTOMY WITH RADIOACTIVE SEED AND SENTINEL LYMPH NODE BIOPSY Right 03/09/2018   Procedure: RIGHT BREAST LUMPECTOMY WITH BRACKETED RADIOACTIVE SEED AND SENTINEL LYMPH NODE BIOPSY;  Surgeon: Rolm Bookbinder, MD;  Location: [place];  Service: General;  Laterality: Right;  . BREAST SURGERY  07/09/2008   mass removal  . CHOLECYSTECTOMY  03/17/11  . RE-EXCISION OF BREAST LUMPECTOMY Right 03/31/2018   Procedure: RE-EXCISION OF BREAST LUMPECTOMY;  Surgeon: Rolm Bookbinder, MD;  Location: [place];  Service: General;  Laterality: Right;  . SKIN TAG REMOVAL      brow and lid  . THIGH / KNEE SOFT TISSUE BIOPSY  09/05/2009  . TUBAL LIGATION  1980    There were no vitals filed for this visit.  Subjective Assessment - 07/29/18 1108    Subjective  I've finished radiation on 07/07/18. They had to hold radiation for a week due to skin openings and the infection I ended up with in my breast. The "sloshing" is mostly gone from my breast and I think that's what alot of the fluid in my breast was. My skin is starting to do better now that I've been been finished with radiation for a few weeks.      Pertinent History  Patient was diagnosed on 12/28/17 with right grade I invasive lobular carcinoma breast cancer. There are 2 areas in the upper outer quadrant that measure 1.7 cm and 1.8 cm. They are ER/PR positive and HER2 negative with a Ki67 of 1%. She has diabetes and hypertension. R lumpectomy 03/09/18, re excision 03/31/18, currently undergoing radiation    Patient Stated Goals  be able to lift arm all the way up without pain    Currently in Pain?  No/denies         [facility] PT Assessment - 07/29/18 0001  AROM   Right Shoulder Flexion  156 Degrees    Right Shoulder ABduction  163 Degrees              Outpatient Re[facility]17/[facility]  Lymphedema Life Impact Scale Total Score  26.47 %           [facility] Adult PT Treatment/Exercise - 07/29/18 0001      Shoulder Exercises: Supine   Horizontal ABduction  Strengthening;Both;10 reps;Theraband    Theraband Level (Shoulder Horizontal ABduction)  Level 1 (Yellow)   tried red but too challenging   External Rotation  Strengthening;Both;10 reps;Theraband    Theraband Level (Shoulder External Rotation)  Level 1 (Yellow)    External Rotation Limitations  VCs for scapular retraction at end of motion    Flexion  Strengthening;Both;5 reps;Theraband   Narrow and Wide Grip, 5 times each   Theraband Level (Shoulder Flexion)  Level 1 (Yellow)    Flexion  Limitations  Pt required demo and VCs throughout for correct technique    Diagonals  Strengthening;Right;Left;5 reps;Theraband    Theraband Level (Shoulder Diagonals)  Level 1 (Yellow)    Diagonals Limitations  Tactile cues for correct direction of motion      Manual Therapy   Manual Lymphatic Drainage (MLD)  Short neck, 5 diaphragmatic breaths, Lt axilla and Rt inguinal nodes, anterior inter-axillary and Rt axillo-inguinal anastomosis, and then focused on Rt breast directing towards pathways reviewing all with pt and having her perform all demos using hand over hand technique                  PT Long Term Goals - 07/29/18 1112      PT LONG TERM GOAL #1   Title  Pt will report a 50% improvement in right breast swelling to decrease risk of infection    Baseline  R breast is 2x size of left breast; 50% improvement at this time-07/29/18    Status  Achieved      PT LONG TERM GOAL #2   Title  Pt will demonstrate 150 degrees of right shoulder flexion to allow her to reach overhead    Baseline  134 degrees; 156 degrees-07/29/18    Status  Achieved      PT LONG TERM GOAL #3   Title  Pt will demonstrate 160 degrees of right shoulder abduction to allow her to reach out to sides    Baseline  131; 163 degrees-07/29/18    Status  Achieved      PT LONG TERM GOAL #4   Title  Pt will be independent in self breast MLD for long term management of swelling    Baseline  Reviewed this with pt today and she is now independent-07/29/18    Status  Achieved      PT LONG TERM GOAL #5   Title  Pt will be independent in a home exercise program for continued strengthening and stretching    Baseline  Progressed HEP to include supine scapular series-07/29/18    Status  Achieved            Plan - 07/29/18 1207    Clinical Impression Statement  Pts A/ROM measurements have improved well since last visit, having met these goals. Also reviewed self manual lymph drainage with pt. She required  multpile tactile and VCs for technique/correct pressure but was able to duplicate this by end of session. Pt is ready for D/C having met all goals.  Rehab Potential  Good    Clinical Impairments Affecting Rehab Potential  undergoing radiation    PT Frequency  2x / week    PT Duration  4 weeks    PT Treatment/Interventions  ADLs/Self Care Home Management;Patient/family education;Therapeutic activities;Therapeutic exercise;Scar mobilization;Compression bandaging;Manual lymph drainage;Manual techniques;Passive range of motion;Taping;Vasopneumatic Device    PT Next Visit Plan  D/C this visit    PT Home Exercise Plan  supine cane exercise and supine scapular series    Consulted and Agree with Plan of Care  Patient       Patient will benefit from skilled therapeutic intervention in order to improve the following deficits and impairments:  Increased fascial restricitons, Pain, Decreased scar mobility, Postural dysfunction, Decreased range of motion, Decreased strength, Impaired UE functional use, Increased edema  Visit Diagnosis: Lymphedema, not elsewhere classified  Stiffness of right shoulder joint  Disorder of the skin and subcutaneous tissue related to radiation, unspecified  Abnormal posture  Muscle weakness (generalized)  Malignant neoplasm of upper-outer quadrant of right breast in female, estrogen receptor positive ([place])     Problem List Patient Active Problem List   Diagnosis Date Noted  . Genetic testing 02/04/2018  . Family history of pancreatic cancer   . Family history of prostate cancer   . Family history of breast cancer   . Malignant neoplasm of upper-outer quadrant of right breast in female, estrogen receptor positive ([place]) 01/07/2018  . Moderate persistent asthma without complication 40/05/2724  . Seasonal and perennial allergic rhinitis 04/26/2017  . Allergy 01/28/2017  . Allergic rhinitis 11/28/2015  . Cough 11/28/2015  . Breast pain 08/25/2012  . Left  breast mass 10/29/2011  . Symptomatic cholecystitis 02/24/2011  . HYPOTHYROIDISM 10/02/2010  . HYPERTENSION 10/02/2010  . G E R D 10/02/2010  . OSTEOARTHRITIS 10/02/2010    Otelia Limes, PTA 07/29/2018, 12:13 PM  [place], [place], [address] Phone: [phone]   Fax:  [phone]  Name: Lisa Horton MRN: [id number] Date of Birth: [birth year]   PHYSICAL THERAPY DISCHARGE SUMMARY  Visits from Start of Care: 4  Current functional level related to goals / functional outcomes: See abve   Remaining deficits: See above   Education / Equipment: Self MLD, stretching HEP Plan: Patient agrees to discharge.  Patient goals were met. Patient is being discharged due to meeting the stated rehab goals.  ?????    Shan Levans, PT 08/11/18

## 2018-07-29 NOTE — Patient Instructions (Signed)

## 2018-08-04 ENCOUNTER — Other Ambulatory Visit: Payer: Medicare Other

## 2018-08-08 ENCOUNTER — Encounter: Payer: Self-pay | Admitting: Radiation Oncology

## 2018-08-08 ENCOUNTER — Ambulatory Visit
Admission: RE | Admit: 2018-08-08 | Discharge: 2018-08-08 | Disposition: A | Discharge status: Home / Self Care | Payer: Medicare Other | Source: Ambulatory Visit | Attending: Radiation Oncology | Admitting: Radiation Oncology

## 2018-08-08 ENCOUNTER — Other Ambulatory Visit: Payer: Self-pay

## 2018-08-08 DIAGNOSIS — Z170 Estrogen receptor positive status [ER+]: Secondary | ICD-10-CM | POA: Insufficient documentation

## 2018-08-08 DIAGNOSIS — C50411 Malignant neoplasm of upper-outer quadrant of right female breast: Secondary | ICD-10-CM | POA: Insufficient documentation

## 2018-08-08 DIAGNOSIS — Z923 Personal history of irradiation: Secondary | ICD-10-CM | POA: Diagnosis not present

## 2018-08-08 NOTE — Progress Notes (Signed)
Pt presents today for f/u with Dr. Sondra Come. Pt reports fatigue worsened immediately after radiation but has since improved. Pt c/o generalized upper body joint pain associated with statin-induced myopathy. Pt rates pain an 8/10 and is taking Ibuprofen with some relief. Pt is using cream daily on breast. Pt c/o pain in breast, scar area, described as stabbing. Breast is hyperpigmented, swollen. Skin is intact and well-healed.   BP 128/83 (Patient Position: Sitting)   Pulse 93   Temp 98.3 F (36.8 C) (Oral)   Resp 18   Ht 5\' 2"  (1.575 m)   Wt 223 lb 3.2 oz (101.2 kg)   LMP 08/03/1984 (Approximate)   SpO2 98%   BMI 40.82 kg/m   Wt Readings from Last 3 Encounters:  08/08/18 223 lb 3.2 oz (101.2 kg)  07/08/18 217 lb 1.6 oz (98.5 kg)  06/21/18 219 lb 11.2 oz (99.7 kg)   Loma Sousa, RN BSN

## 2018-08-08 NOTE — Progress Notes (Signed)
Radiation Oncology         [phone] ________________________________  Name: Lisa Horton MRN: [id number]  Date: 08/08/2018  DOB: [birth year]  Follow-Up Visit Note  CC: Maury Dus, MD  [name], MD    ICD-10-CM   1. Malignant neoplasm of upper-outer quadrant of right breast in female, estrogen receptor positive (HCC)Chronic C50.411    Z17.0     Diagnosis:   Malignant neoplasm of UOQ right breast, stage IA (pT2, pN0, cM0, G2, ER+, PR+, Her2-)   Interval Since Last Radiation:  1  months  Radiation treatment dates:   1. 05/09/18-05/27/18, patient was on a 18 day break in treatment after developing a seroma and breast infection,  06/13/18-06/28/18                                                  2. 06/29/18-07/07/18   Site/dose:   1. Right breast; 50.4 Gy total in 28 fractions of 1.8 Gy                      2. Boost; 2 Gy in 5 fractions for a total of 10 Gy  Narrative:  The patient returns today for routine follow-up.  she is doing well overall. Her energy level is improving. She is currently taking Amoxicillin for sinus infection              On review of systems, she reports soreness in her right breast and axilla. she denies right arm swelling and any other symptoms. Pertinent positives are listed and detailed within the above HPI.  She has a bilateral breast MRI scheduled for 1/12.                 ALLERGIES:  is allergic to statins; adhesive [tape]; hydrogen peroxide; lidocaine; metrogel [metronidazole]; latex; neomycin-bacitracin zn-polymyx; sulfa antibiotics; and sulfonamide derivatives.  Meds: Current Outpatient Medications  Medication Sig Dispense Refill  . Albuterol Sulfate (PROAIR RESPICLICK) 128 (90 Base) MCG/ACT AEPB Inhale 2 puffs into the lungs every 4 (four) hours as needed. 1 each 0  . anastrozole (ARIMIDEX) 1 MG tablet Take 1 tablet (1 mg total) by mouth daily. 30 tablet 5  . CALCIUM PO Take 1 tablet by mouth daily. Reported on 01/06/2016    .  cetirizine (ZYRTEC) 10 MG tablet Take 10 mg by mouth daily.      . fluticasone furoate-vilanterol (BREO ELLIPTA) 200-25 MCG/INH AEPB Inhale 1 puff into the lungs daily. 30 each 5  . irbesartan (AVAPRO) 300 MG tablet Take 300 mg by mouth daily.     Marland Kitchen levothyroxine (SYNTHROID, LEVOTHROID) 112 MCG tablet Take 1 tablet by mouth daily.    . Multiple Vitamins-Minerals (MULTIVITAMINS THER. W/MINERALS) TABS Take 1 tablet by mouth daily.    Marland Kitchen omeprazole (PRILOSEC) 40 MG capsule Take 1 capsule by mouth daily.    Marland Kitchen oxyCODONE (OXY IR/ROXICODONE) 5 MG immediate release tablet Take 1 tablet (5 mg total) by mouth every 6 (six) hours as needed for moderate pain, severe pain or breakthrough pain. 10 tablet 0  . traMADol (ULTRAM) 50 MG tablet Take 2 tablets (100 mg total) by mouth every 6 (six) hours as needed. 10 tablet 0  . Vitamin D, Ergocalciferol, (DRISDOL) 50000 units CAPS capsule Take 1 capsule by mouth once a week.  3   No current  facility-administered medications for this encounter.     Physical Findings: The patient is in no acute distress. Patient is alert and oriented.  height is _0  (1.575 m) and weight is 223 lb 3.2 oz (101.2 kg). Her oral temperature is 98.3 F (36.8 C). Her blood pressure is 128/83 and her pulse is 93. Her respiration is 18 and oxygen saturation is 98%. .  No significant changes. Lungs are clear to auscultation bilaterally. Heart has regular rate and rhythm. No palpable cervical, supraclavicular, or axillary adenopathy. Abdomen soft, non-tender, normal bowel sounds. Right breast the skin has healed well. She continues to have hyperpigmentation changes in the center portion. She continues to have a lot of edema but it is better compared to my last exam.   Lab Findings: Lab Results  Component Value Date   WBC 5.4 01/12/2018   HGB 12.8 01/12/2018   HCT 39.7 01/12/2018   MCV 92.2 01/12/2018   PLT 171 01/12/2018    Radiographic Findings: No results found.  Impression:  The  patient is recovering from the effects of radiation.  No obvious signs of recurrence on exam today.  Plan:  Routine follow-up in 3 months.   ____________________________________   Blair Promise, PhD, MD    This document serves as a record of services personally performed by Gery Pray, MD. It was created on his behalf by Mary-Margaret Loma Messing, a trained medical scribe. The creation of this record is based on the scribe's personal observations and the provider's statements to them. This document has been checked and approved by the attending provider.

## 2018-08-14 ENCOUNTER — Ambulatory Visit
Admission: RE | Admit: 2018-08-14 | Discharge: 2018-08-14 | Disposition: A | Discharge status: Home / Self Care | Payer: Medicare Other | Source: Ambulatory Visit | Attending: General Surgery | Admitting: General Surgery

## 2018-08-14 DIAGNOSIS — N6320 Unspecified lump in the left breast, unspecified quadrant: Secondary | ICD-10-CM

## 2018-08-14 MED ORDER — GADOBUTROL 1 MMOL/ML IV SOLN
10.0000 mL | Freq: Once | INTRAVENOUS | Status: AC | PRN
  Administered 2018-08-14: 10 mL via INTRAVENOUS

## 2018-09-08 ENCOUNTER — Telehealth: Payer: Self-pay | Admitting: Hematology

## 2018-09-08 NOTE — Telephone Encounter (Signed)
Called regarding cancellation per Dr. Burr Medico will reschedule upon Lacie return

## 2018-09-20 DIAGNOSIS — E559 Vitamin D deficiency, unspecified: Secondary | ICD-10-CM | POA: Diagnosis not present

## 2018-09-20 DIAGNOSIS — I10 Essential (primary) hypertension: Secondary | ICD-10-CM | POA: Diagnosis not present

## 2018-09-20 DIAGNOSIS — G729 Myopathy, unspecified: Secondary | ICD-10-CM | POA: Diagnosis not present

## 2018-09-20 DIAGNOSIS — E119 Type 2 diabetes mellitus without complications: Secondary | ICD-10-CM | POA: Diagnosis not present

## 2018-09-20 DIAGNOSIS — E039 Hypothyroidism, unspecified: Secondary | ICD-10-CM | POA: Diagnosis not present

## 2018-09-27 DIAGNOSIS — E039 Hypothyroidism, unspecified: Secondary | ICD-10-CM | POA: Diagnosis not present

## 2018-09-27 DIAGNOSIS — Z0000 Encounter for general adult medical examination without abnormal findings: Secondary | ICD-10-CM | POA: Diagnosis not present

## 2018-10-07 NOTE — Progress Notes (Signed)
[name]   [phone] [phone]   Clinic Follow up Note   Patient Care Team: Maury Dus, MD as PCP - General (Family Medicine) [name], MD as Consulting Physician (General Surgery) Truitt Merle, MD as Consulting Physician (Hematology) Gery Pray, MD as Consulting Physician (Radiation Oncology)  Date of Service:  10/10/2018  CHIEF COMPLAINT: F/u of right breast cancer  SUMMARY OF ONCOLOGIC HISTORY: Oncology History   Cancer Staging Malignant neoplasm of upper-outer quadrant of right breast in female, estrogen receptor positive ([place]) Staging form: Breast, AJCC 8th Edition - Clinical stage from 01/04/2018: Stage IA (cT1c, cN0, cM0, G2, ER+, PR+, HER2-) - Signed by Truitt Merle, MD on 01/11/2018 - Pathologic: Stage IA (pT2, pN0, cM0, G2, ER+, PR+, HER2-) - Signed by Nicholas Lose, MD on 06/21/2018       Malignant neoplasm of upper-outer quadrant of right breast in female, estrogen receptor positive ([place])   12/28/2017 Mammogram    Bilateral diagnostic mammography with tomography and right breast ultrasonography at [facility] on 12/28/2017 showing: The irregular architectural distortion in the right breast upper outer quadrant posterior depth is indeterminate. The architectural distortion in the right breast upper outer quadrant middle depth is indeterminate.     01/03/2018 Pathology Results    Right needle core biopsy with pathology showing: Breast, right, needle core biopsy, (A) 11 o'clock with invasive mammary carcinoma, grade I-II. Breast, right, needle core biopsy, (B) 11 o'clock with invasive mammary carcinoma, grade I-II. Prognostic indicators significant for: ER, 60% positive with moderate staining intensity and PR, 90% positive with strong staining intensity. Proliferation marker Ki67 at 1%. HER2 negative.    01/04/2018 Cancer Staging    Staging form: Breast, AJCC 8th Edition - Clinical stage from 01/04/2018: Stage IA (cT1c, cN0, cM0, G2, ER+, PR+,  HER2-) - Signed by Truitt Merle, MD on 01/11/2018    01/25/2018 Genetic Testing    The Multi-Cancer Panel offered by [facility] includes sequencing and/or deletion duplication testing of the following 83 genes: ALK, APC, ATM, AXIN2,BAP1,  BARD1, BLM, BMPR1A, BRCA1, BRCA2, BRIP1, CASR, CDC73, CDH1, CDK4, CDKN1B, CDKN1C, CDKN2A (p14ARF), CDKN2A (p16INK4a), CEBPA, CHEK2, CTNNA1, DICER1, DIS3L2, EGFR (c.2369C>T, p.Thr790Met variant only), EPCAM (Deletion/duplication testing only), FH, FLCN, GATA2, GPC3, GREM1 (Promoter region deletion/duplication testing only), HOXB13 (c.251G>A, p.Gly84Glu), HRAS, KIT, MAX, MEN1, MET, MITF (c.952G>A, p.Glu318Lys variant only), MLH1, MSH2, MSH3, MSH6, MUTYH, NBN, NF1, NF2, NTHL1, PALB2, PDGFRA, PHOX2B, PMS2, POLD1, POLE, POT1, PRKAR1A, PTCH1, PTEN, RAD50, RAD51C, RAD51D, RB1, RECQL4, RET, RUNX1, SDHAF2, SDHA (sequence changes only), SDHB, SDHC, SDHD, SMAD4, SMARCA4, SMARCB1, SMARCE1, STK11, SUFU, TERC, TERT, TMEM127, TP53, TSC1, TSC2, VHL, WRN and WT1.   Results: No pathogenic variants identified.  A Variant of Uncertain significance in BAP1 was identified c.1066C>T (p.Arg356Trp).  The date of this test report is 01/25/2018.     03/09/2018 Surgery    Right lumpectomy: ILC grade 2, 2 foci, 2 cm, 2.5 cm, superior margin positive, 0/5 lymph nodes negative, ER 60%, PR 90%, Ki-67 1%, HER-2 negative T2N0 stage Ia    03/09/2018 Oncotype testing    Recurrence score 17 Distant risk of recurrence at 9 years with AI or Tamoxifen alone is 5% There is a <1% benefit of chemotherapy    03/31/2018 Pathology Results    Reexcision: No residual cancer    05/09/2018 - 07/08/2018 Radiation Therapy    Radiation with Dr. Sondra Come 05/09/18- 07/08/18     06/21/2018 Cancer Staging    Staging form: Breast, AJCC 8th Edition - Pathologic:  Stage IA (pT2, pN0, cM0, G2, ER+, PR+, HER2-) - Signed by Nicholas Lose, MD on 06/21/2018    07/2018 -  Anti-estrogen oral therapy    Anastrozole 45m daily starting 07/2018       CURRENT THERAPY:  Anastrozole 173mdaily starting in 07/2018  INTERVAL HISTORY:  [name]. She presents to the clinic today by herself. She notes she is doing well. She has been taking anastrozole and experiencing forgetting and manageable hot flashes. This can wake her up at time sometimes. She denies joint pain. She notes soreness in right axilla from surgery.    REVIEW OF SYSTEMS:   Constitutional: Denies fevers, chills or abnormal weight loss (+) Manageable hot flashes  Eyes: Denies blurriness of vision Ears, nose, mouth, throat, and face: Denies mucositis or sore throat Respiratory: Denies cough, dyspnea or wheezes Cardiovascular: Denies palpitation, chest discomfort (+) mild Left ankle extremity swelling Gastrointestinal:  Denies nausea, heartburn or change in bowel habits Skin: Denies abnormal skin rashes MSK: (+) Soreness of right axilla  Lymphatics: Denies new lymphadenopathy or easy bruising Neurological:Denies numbness, tingling or new weaknesses (+) Forgetfulness  Behavioral/Psych: Mood is stable, no new changes  All other systems were reviewed with the patient and are negative.  MEDICAL HISTORY:  Past Medical History:  Diagnosis Date  . Acid reflux   . Allergic rhinitis   . Arthritis   . Asthma   . Breast lump   . Bruises easily   . Cancer ([place]  . Chicken pox   . Colon polyp   . Family history of breast cancer   . Family history of pancreatic cancer   . Family history of prostate cancer   . Gum disease   . Hypertension   . Hypothyroidism   . Incontinence   . Measles   . Myopathy    arms and legs, statin drug related  . Night sweats   . Nipple discharge   . Pre-diabetes   . Prediabetes 06/2014    SURGICAL HISTORY: Past Surgical History:  Procedure Laterality Date  . ABDOMINAL HYSTERECTOMY  1986   secondary to fibroids  . BREAST LUMPECTOMY WITH RADIOACTIVE SEED AND SENTINEL LYMPH NODE  BIOPSY Right 03/09/2018   Procedure: RIGHT BREAST LUMPECTOMY WITH BRACKETED RADIOACTIVE SEED AND SENTINEL LYMPH NODE BIOPSY;  Surgeon: WaRolm BookbinderMD;  [facility] Service[facility] General;  Laterality: Right;  . BREAST SURGERY  07/09/2008   mass removal  . CHOLECYSTECTOMY  03/17/11  . RE-EXCISION OF BREAST LUMPECTOMY Right 03/31/2018   Procedure: RE-EXCISION OF BREAST LUMPECTOMY;  Surgeon: WaRolm BookbinderMD;  [facility] General;  Laterality: Right;  . SKIN TAG REMOVAL     brow and lid  . THIGH / KNEE SOFT TISSUE BIOPSY  09/05/2009  . TUBAL LIGATION  1980    I have reviewed the social history and family history with the patient and they are unchanged from previous note.  ALLERGIES:  is allergic to statins; adhesive [tape]; hydrogen peroxide; lidocaine; metrogel [metronidazole]; latex; neomycin-bacitracin zn-polymyx; sulfa antibiotics; and sulfonamide derivatives.  MEDICATIONS:  Current Outpatient Medications  Medication Sig Dispense Refill  . Albuterol Sulfate (PROAIR RESPICLICK) [id number] Base) MCG/ACT AEPB Inhale 2 puffs into the lungs every 4 (four) hours as needed. 1 each 0  . anastrozole (ARIMIDEX) 1 MG tablet Take 1 tablet (1 mg total) by mouth daily. 30 tablet 5  . CALCIUM PO  Take 1 tablet by mouth daily. Reported on 01/06/2016    . cetirizine (ZYRTEC) 10 MG tablet Take 10 mg by mouth daily.      . fluticasone furoate-vilanterol (BREO ELLIPTA) 200-25 MCG/INH AEPB Inhale 1 puff into the lungs daily. 30 each 5  . irbesartan (AVAPRO) 300 MG tablet Take 300 mg by mouth daily.     Marland Kitchen levothyroxine (SYNTHROID, LEVOTHROID) 112 MCG tablet Take 1 tablet by mouth daily.    . Multiple Vitamins-Minerals (MULTIVITAMINS THER. W/MINERALS) TABS Take 1 tablet by mouth daily.    Marland Kitchen omeprazole (PRILOSEC) 40 MG capsule Take 1 capsule by mouth daily.    . Vitamin D, Ergocalciferol, (DRISDOL) 50000 units CAPS capsule Take 1 capsule by mouth once a  week.  3   No current facility-administered medications for this visit.     PHYSICAL EXAMINATION: ECOG PERFORMANCE STATUS: 0 - Asymptomatic  Vitals:   10/10/18 1526 10/10/18 1533  BP: (!) 179/90 (!) 171/87  Pulse: 94   Resp: 17   Temp: 98.4 F (36.9 C)   SpO2: 99%    Filed Weights   10/10/18 1526  Weight: 228 lb 9.6 oz (103.7 kg)    GENERAL:alert, no distress and comfortable SKIN: skin color, texture, turgor are normal, no rashes or significant lesions EYES: normal, Conjunctiva are pink and non-injected, sclera clear OROPHARYNX:no exudate, no erythema and lips, buccal mucosa, and tongue normal  NECK: supple, thyroid normal size, non-tender, without nodularity LYMPH:  no palpable lymphadenopathy in the cervical, axillary or inguinal LUNGS: clear to auscultation and percussion with normal breathing effort HEART: regular rate & rhythm and no murmurs  (+) mild Left ankle extremity edema ABDOMEN:abdomen soft, non-tender and normal bowel sounds Musculoskeletal:no cyanosis of digits and no clubbing  NEURO: alert & oriented x 3 with fluent speech, no focal motor/sensory deficits BREAST: S/p right breast lumpectomy: Surgical incision healed well (+) skin hyperpigmentation (+) right breast and axillary lymphedema, mildly warm  LABORATORY DATA:  I have reviewed the data as listed CBC Latest Ref Rng & Units 10/10/2018 01/12/2018 11/28/2015  WBC 4.0 - 10.5 K/uL 4.8 5.4 8.1  Hemoglobin 12.0 - 15.0 g/dL 12.1 12.8 13.4  Hematocrit 36.0 - 46.0 % 38.6 39.7 40.8  Platelets 150 - 400 K/uL 143(L) 171 203.0     CMP Latest Ref Rng & Units 10/10/2018 01/12/2018 03/10/2011  Glucose 70 - 99 mg/dL 120(H) 121 103(H)  BUN 8 - 23 mg/dL '10 15 12  ' Creatinine 0.44 - 1.00 mg/dL 0.97 1.07 1.02  Sodium 135 - 145 mmol/L 141 140 141  Potassium 3.5 - 5.1 mmol/L 4.3 4.5 4.1  Chloride 98 - 111 mmol/L 106 106 104  CO2 22 - 32 mmol/L '26 27 28  ' Calcium 8.9 - 10.3 mg/dL 8.3(L) 9.3 9.8  Total Protein 6.5 - 8.1 g/dL  7.8 8.0 7.6  Total Bilirubin 0.3 - 1.2 mg/dL 0.7 0.4 0.4  Alkaline Phos 38 - 126 U/L 46 58 64  AST 15 - 41 U/L '18 20 29  ' ALT 0 - 44 U/L '18 21 28      ' RADIOGRAPHIC STUDIES: I have personally reviewed the radiological images as listed and agreed with the findings in the report. No results found.   ASSESSMENT & PLAN:  Lisa Horton is a 73 y.o. female with   1.Malignant neoplasm of upper outer quadrant of right breast, invasive lobular carcinoma, Stage IA, cT1cN0M0,G2,ER (+), PR (+), HER2 negative -She was diagnosed in 01/2018. She is s/p right breast lumpectomy and  adjuvant radiation.  -Her Oncotype score showed low risk with recurrence score 17. I do not recommend adjuvant chemotherapy.  -She started anti-estrogen therapy with Anastrozole in 07/2018. Tolerating well with manageable hot flashes and spells of forgetting. -She is clinically doing well. Lab reviewed, her CBC WNL except PLT at 143K and CMP WNL. Her physical exam and 08/15/18 MRI were unremarkable except right breast and axillary lymphedema. There is no clinical concern for recurrence. -I encouraged her to follow up with PT for more exercises and possible treatment for lymphedema. She agreed.  -I offered her the chance to survivorship clinic. She is interested. F/u in 3 months with NP Lacie.  -Continue Anastrozole  -f/u in 6 months with me.   2. Osteopenia -Her 01/2017 DEXA showed Osteopenia with lowest T-Score at -2.2 at Right Femur Neck  -I also discussed letrozole can weaken her bone. Will monitor her bone density every 2 years  -Continue Calcium and Vitamin D. I encouraged her to do weight bearing exercise  -Next DEXA in 01/2019.      PLAN: Survivorship with Lacie in 3 months  lab and f/u in 6 months  Continue anastrozole  M[facility]ogram [facility] in later May    No problem-specific Assessment & Plan notes found for this encounter.   Orders Placed This Encounter  Procedures  . MM DIAG BREAST TOMO  BILATERAL    Standing Status:   Future    Standing Expiration Date:   10/10/2019    Scheduling Instructions:     [facility]    Order Specific Question:   Reason for Exam (SYMPTOM  OR DIAGNOSIS REQUIRED)    Answer:   screening    Order Specific Question:   Preferred imaging location?    Answer:   External  . DG Bone Density    Standing Status:   Future    Standing Expiration Date:   10/10/2019    Scheduling Instructions:     [facility]    Order Specific Question:   Reason for Exam (SYMPTOM  OR DIAGNOSIS REQUIRED)    Answer:   screening    Order Specific Question:   Preferred imaging location?    Answer:   External   All questions were answered. The patient knows to call the clinic with any problems, questions or concerns. No barriers to learning was detected. I spent 15 minutes counseling the patient face to face. The total time spent in the appointment was 20 minutes and more than 50% was on counseling and review of test results     Truitt Merle, MD 10/10/2018   I, Joslyn Devon, am acting as scribe for Truitt Merle, MD.   I have reviewed the above documentation for accuracy and completeness, and I agree with the above.

## 2018-10-10 ENCOUNTER — Inpatient Hospital Stay (HOSPITAL_BASED_OUTPATIENT_CLINIC_OR_DEPARTMENT_OTHER): Payer: Medicare Other | Admitting: Hematology

## 2018-10-10 ENCOUNTER — Telehealth: Payer: Self-pay | Admitting: Hematology

## 2018-10-10 ENCOUNTER — Inpatient Hospital Stay: Payer: Medicare Other | Attending: Hematology and Oncology

## 2018-10-10 ENCOUNTER — Encounter: Payer: Self-pay | Admitting: Hematology

## 2018-10-10 VITALS — BP 171/87 | HR 94 | Temp 98.4°F | Resp 17 | Ht 62.0 in | Wt 228.6 lb

## 2018-10-10 DIAGNOSIS — M8580 Other specified disorders of bone density and structure, unspecified site: Secondary | ICD-10-CM | POA: Insufficient documentation

## 2018-10-10 DIAGNOSIS — E2839 Other primary ovarian failure: Secondary | ICD-10-CM

## 2018-10-10 DIAGNOSIS — Z79811 Long term (current) use of aromatase inhibitors: Secondary | ICD-10-CM | POA: Diagnosis not present

## 2018-10-10 DIAGNOSIS — Z170 Estrogen receptor positive status [ER+]: Secondary | ICD-10-CM

## 2018-10-10 DIAGNOSIS — C50411 Malignant neoplasm of upper-outer quadrant of right female breast: Secondary | ICD-10-CM

## 2018-10-10 LAB — CBC WITH DIFFERENTIAL (CANCER CENTER ONLY)
ABS IMMATURE GRANULOCYTES: 0.01 10*3/uL (ref 0.00–0.07)
Basophils Absolute: 0 10*3/uL (ref 0.0–0.1)
Basophils Relative: 0 %
EOS PCT: 8 %
Eosinophils Absolute: 0.4 10*3/uL (ref 0.0–0.5)
HEMATOCRIT: 38.6 % (ref 36.0–46.0)
HEMOGLOBIN: 12.1 g/dL (ref 12.0–15.0)
Immature Granulocytes: 0 %
LYMPHS ABS: 1 10*3/uL (ref 0.7–4.0)
LYMPHS PCT: 22 %
MCH: 29.5 pg (ref 26.0–34.0)
MCHC: 31.3 g/dL (ref 30.0–36.0)
MCV: 94.1 fL (ref 80.0–100.0)
Monocytes Absolute: 0.4 10*3/uL (ref 0.1–1.0)
Monocytes Relative: 8 %
NEUTROS ABS: 2.9 10*3/uL (ref 1.7–7.7)
Neutrophils Relative %: 62 %
Platelet Count: 143 10*3/uL — ABNORMAL LOW (ref 150–400)
RBC: 4.1 MIL/uL (ref 3.87–5.11)
RDW: 12.9 % (ref 11.5–15.5)
WBC: 4.8 10*3/uL (ref 4.0–10.5)
nRBC: 0 % (ref 0.0–0.2)

## 2018-10-10 LAB — CMP (CANCER CENTER ONLY)
ALBUMIN: 3.7 g/dL (ref 3.5–5.0)
ALT: 18 U/L (ref 0–44)
AST: 18 U/L (ref 15–41)
Alkaline Phosphatase: 46 U/L (ref 38–126)
Anion gap: 9 (ref 5–15)
BUN: 10 mg/dL (ref 8–23)
CHLORIDE: 106 mmol/L (ref 98–111)
CO2: 26 mmol/L (ref 22–32)
CREATININE: 0.97 mg/dL (ref 0.44–1.00)
Calcium: 8.3 mg/dL — ABNORMAL LOW (ref 8.9–10.3)
GFR, Est AFR Am: >60 mL/min (ref >60)
GFR, Estimated: 59 mL/min — ABNORMAL LOW (ref >60)
Glucose, Bld: 120 mg/dL — ABNORMAL HIGH (ref 70–99)
POTASSIUM: 4.3 mmol/L (ref 3.5–5.1)
SODIUM: 141 mmol/L (ref 135–145)
Total Bilirubin: 0.7 mg/dL (ref 0.3–1.2)
Total Protein: 7.8 g/dL (ref 6.5–8.1)

## 2018-10-10 NOTE — Telephone Encounter (Signed)
Called and scheduled appt per 3/9 los.  Patient aware of appt date and time.  Printed and mailed calendar.

## 2018-11-07 ENCOUNTER — Other Ambulatory Visit: Payer: Self-pay

## 2018-11-07 ENCOUNTER — Ambulatory Visit
Admission: RE | Admit: 2018-11-07 | Discharge: 2018-11-07 | Disposition: A | Discharge status: Home / Self Care | Payer: Medicare Other | Source: Ambulatory Visit | Attending: Radiation Oncology | Admitting: Radiation Oncology

## 2018-11-07 ENCOUNTER — Encounter: Payer: Self-pay | Admitting: Radiation Oncology

## 2018-11-07 VITALS — BP 135/72 | HR 97 | Temp 97.7°F | Resp 18 | Ht 62.0 in | Wt 222.1 lb

## 2018-11-07 DIAGNOSIS — Z882 Allergy status to sulfonamides status: Secondary | ICD-10-CM | POA: Insufficient documentation

## 2018-11-07 DIAGNOSIS — Z79811 Long term (current) use of aromatase inhibitors: Secondary | ICD-10-CM | POA: Insufficient documentation

## 2018-11-07 DIAGNOSIS — Z91040 Latex allergy status: Secondary | ICD-10-CM | POA: Diagnosis not present

## 2018-11-07 DIAGNOSIS — Z881 Allergy status to other antibiotic agents status: Secondary | ICD-10-CM | POA: Insufficient documentation

## 2018-11-07 DIAGNOSIS — N6459 Other signs and symptoms in breast: Secondary | ICD-10-CM | POA: Diagnosis not present

## 2018-11-07 DIAGNOSIS — C50411 Malignant neoplasm of upper-outer quadrant of right female breast: Secondary | ICD-10-CM | POA: Diagnosis not present

## 2018-11-07 DIAGNOSIS — Z888 Allergy status to other drugs, medicaments and biological substances status: Secondary | ICD-10-CM | POA: Insufficient documentation

## 2018-11-07 DIAGNOSIS — Z170 Estrogen receptor positive status [ER+]: Secondary | ICD-10-CM | POA: Insufficient documentation

## 2018-11-07 DIAGNOSIS — Z08 Encounter for follow-up examination after completed treatment for malignant neoplasm: Secondary | ICD-10-CM | POA: Diagnosis not present

## 2018-11-07 DIAGNOSIS — Z79899 Other long term (current) drug therapy: Secondary | ICD-10-CM | POA: Insufficient documentation

## 2018-11-07 NOTE — Progress Notes (Signed)
Radiation Oncology         [phone] ________________________________  Name: Lisa Horton MRN: [id number]  Date: 11/07/2018  DOB: [birth year]  Follow-Up Visit Note  CC: [name], DO  [name], MD    ICD-10-CM   1. Malignant neoplasm of upper-outer quadrant of right breast in female, estrogen receptor positive ([place]) C50.411    Z17.0     Diagnosis:   Malignant neoplasm of UOQ right breast, stage IA (pT2, pN0, cM0, G2, ER+, PR+, Her2-)   Interval Since Last Radiation:  4  months  Radiation treatment dates:   1. 05/09/18-05/27/18, patient was on a 18 day break in treatment after developing a seroma and breast infection,  06/13/18-06/28/18                                                  2. 06/29/18-07/07/18  Site/dose:   1. Right breast; 50.4 Gy total in 28 fractions of 1.8 Gy                      2. Boost; 2 Gy in 5 fractions for a total of 10 Gy  Narrative:  The patient returns today for routine follow-up.  she is doing well overall.   Since she was last seen in the clinic, she underwent a bilateral breast MRI with and without contrast on 08/14/18 which showed expected post treatment changes in the lateral portion of the right breast. No MRI evidence for malignancy in either breast. An annual diagnostic mammogram was recommended in May 2020.  On review of systems, she reports continued intermittent discomfort in the right breast, but does not require any prescription medication for this issue. She also complains of feeling small lumps in her upper arm and notices some mild swelling in her right arm. She does not report using a lymphedema sleeve, but did undergo a brief course of PT related to her breast edema and mild lymphedema.  she denies other symptoms. Pertinent positives are listed and detailed within the above HPI.                ALLERGIES:  is allergic to statins; adhesive [tape]; hydrogen peroxide; lidocaine; metrogel [metronidazole]; latex; neomycin-bacitracin  zn-polymyx; sulfa antibiotics; and sulfonamide derivatives.  Meds: Current Outpatient Medications  Medication Sig Dispense Refill  . Albuterol Sulfate (PROAIR RESPICLICK) 626 (90 Base) MCG/ACT AEPB Inhale 2 puffs into the lungs every 4 (four) hours as needed. 1 each 0  . anastrozole (ARIMIDEX) 1 MG tablet Take 1 tablet (1 mg total) by mouth daily. 30 tablet 5  . CALCIUM PO Take 1 tablet by mouth daily. Reported on 01/06/2016    . cetirizine (ZYRTEC) 10 MG tablet Take 10 mg by mouth daily.      . fluticasone furoate-vilanterol (BREO ELLIPTA) 200-25 MCG/INH AEPB Inhale 1 puff into the lungs daily. 30 each 5  . irbesartan (AVAPRO) 300 MG tablet Take 300 mg by mouth daily.     Marland Kitchen levothyroxine (SYNTHROID, LEVOTHROID) 112 MCG tablet Take 1 tablet by mouth daily.    . Multiple Vitamins-Minerals (MULTIVITAMINS THER. W/MINERALS) TABS Take 1 tablet by mouth daily.    Marland Kitchen omeprazole (PRILOSEC) 40 MG capsule Take 1 capsule by mouth daily.    . Vitamin D, Ergocalciferol, (DRISDOL) 50000 units CAPS capsule Take 1 capsule by mouth once a  week.  3   No current facility-administered medications for this encounter.     Physical Findings: The patient is in no acute distress. Patient is alert and oriented.  height is _0  (1.575 m) and weight is 222 lb 2 oz (100.8 kg). Her oral temperature is 97.7 F (36.5 C). Her blood pressure is 135/72 and her pulse is 97. Her respiration is 18 and oxygen saturation is 99%. .  No significant changes. Lungs are clear to auscultation bilaterally. Heart has regular rate and rhythm. No palpable cervical, supraclavicular, or axillary adenopathy. Abdomen soft, non-tender, normal bowel sounds. Left breast no dominant mass, nipple discharge or bleeding. In the right breast she continues to have hyperpigmentation changes and moderate edema. No dominant mass appreciated in the breast, nipple discharge or bleeding. No palpable cords in the right upper arm. Possible small fatty deposits in  the right upper arm but no suspicious nodularity.   Lab Findings: Lab Results  Component Value Date   WBC 4.8 10/10/2018   HGB 12.1 10/10/2018   HCT 38.6 10/10/2018   MCV 94.1 10/10/2018   PLT 143 (L) 10/10/2018    Radiographic Findings: No results found.  Impression:  No evidence of recurrence on clinical exam. She has some persistent edema in the breast. Would recommend additional physical therapy to address this issue, but given the COVID-19 pandemic, routine PT has been placed on hold. Pt would like to follow-up in radiation oncology one more time in approximately 3 months to see if we need to address physical therapy issues.  Plan:  Follow-up in 3 months to further evaluate breast edema issues. She will also proceed with survivorship prior to that visit. The patient did see Dr. Burr Medico recently and received a good report; she seems to be tolerating Arimidex well.   ____________________________________   Blair Promise, PhD, MD    This document serves as a record of services personally performed by Gery Pray, MD. It was created on his behalf by Mary-Margaret Loma Messing, a trained medical scribe. The creation of this record is based on the scribe's personal observations and the provider's statements to them. This document has been checked and approved by the attending provider.

## 2018-11-07 NOTE — Progress Notes (Signed)
Pt presents today for f/u with Dr. Sondra Come. Pt reports intermittent posterior headaches and intermittent right wrist pain. Pt reports breast feels swollen and sore. Pt feels "tumors" underneath skin on right anterior aspect of right upper arm. Breast is swollen, slightly firm, and skin is taut. Hyperpigmented. Pt reports an area of swelling in axilla where lymph nodes removed.   BP 135/72 (BP Location: Left Arm, Patient Position: Sitting)   Pulse 97   Temp 97.7 F (36.5 C) (Oral)   Resp 18   Ht 5\' 2"  (1.575 m)   Wt 222 lb 2 oz (100.8 kg)   LMP 08/03/1984 (Approximate)   SpO2 99%   BMI 40.63 kg/m   Wt Readings from Last 3 Encounters:  11/07/18 222 lb 2 oz (100.8 kg)  10/10/18 228 lb 9.6 oz (103.7 kg)  08/08/18 223 lb 3.2 oz (101.2 kg)   Loma Sousa, RN BSN

## 2018-11-07 NOTE — Patient Instructions (Signed)
Coronavirus (COVID-19) Are you at risk?  Are you at risk for the Coronavirus (COVID-19)?  To be considered HIGH RISK for Coronavirus (COVID-19), you have to meet the following criteria:  . Traveled to [place], [place], [place], [place] or [place]; or in the [place] to [place], [place], [place], or [place]; and have fever, cough, and shortness of breath within the last 2 weeks of travel OR . Been in close contact with a person diagnosed with COVID-19 within the last 2 weeks and have fever, cough, and shortness of breath . IF YOU DO NOT MEET THESE CRITERIA, YOU ARE CONSIDERED LOW RISK FOR COVID-19.  What to do if you are HIGH RISK for COVID-19?  . If you are having a medical emergency, call 911. . Seek medical care right away. Before you go to a doctor's office, urgent care or emergency department, call ahead and tell them about your recent travel, contact with someone diagnosed with COVID-19, and your symptoms. You should receive instructions from your physician's office regarding next steps of care.  . When you arrive at healthcare provider, tell the healthcare staff immediately you have returned from visiting [place], [place], [place], [place] or [place]; or traveled in the [place] to [place], [place], [place], or [place]; in the last two weeks or you have been in close contact with a person diagnosed with COVID-19 in the last 2 weeks.   . Tell the health care staff about your symptoms: fever, cough and shortness of breath. . After you have been seen by a medical provider, you will be either: o Tested for (COVID-19) and discharged home on quarantine except to seek medical care if symptoms worsen, and asked to  - Stay home and avoid contact with others until you get your results (4-5 days)  - Avoid travel on public transportation if possible (such as bus, train, or airplane) or o Sent to the Emergency Department by EMS for evaluation, COVID-19 testing, and possible  admission depending on your condition and test results.  What to do if you are LOW RISK for COVID-19?  Reduce your risk of any infection by using the same precautions used for avoiding the common cold or flu:  . Wash your hands often with soap and warm water for at least 20 seconds.  If soap and water are not readily available, use an alcohol-based hand sanitizer with at least 60% alcohol.  . If coughing or sneezing, cover your mouth and nose by coughing or sneezing into the elbow areas of your shirt or coat, into a tissue or into your sleeve (not your hands). . Avoid shaking hands with others and consider head nods or verbal greetings only. . Avoid touching your eyes, nose, or mouth with unwashed hands.  . Avoid close contact with people who are sick. . Avoid places or events with large numbers of people in one location, like concerts or sporting events. . Carefully consider travel plans you have or are making. . If you are planning any travel outside or inside the [place], visit the CDC's Travelers' Health webpage for the latest health notices. . If you have some symptoms but not all symptoms, continue to monitor at home and seek medical attention if your symptoms worsen. . If you are having a medical emergency, call 911.   ADDITIONAL HEALTHCARE OPTIONS FOR PATIENTS  [facility] Tele[facility] / e-Visit: [email or web address]         [facility]: [phone]  [address]   [facility]: [phone]                   [facility]: [phone]   

## 2018-11-14 ENCOUNTER — Telehealth: Payer: Self-pay

## 2018-11-14 NOTE — Telephone Encounter (Signed)
-----   Message from Jesse Fall, RN sent at 11/11/2018  4:39 PM EDT ----- I tried to call pt & got vm. Asked her to call back.  Not sure if this got taken care of. Thanks, Janifer Adie ----- Message ----- From: Truitt Merle, MD Sent: 10/11/2018   8:14 AM EDT To: Jesse Fall, RN  Please let pt know her calcium level is low, and I suggest her to increase calcium to 2 tabs daily with VitD 1000-2000u daily, thanks   Truitt Merle  10/11/2018

## 2018-11-14 NOTE — Telephone Encounter (Signed)
Spoke with patient regarding lab results.  Calcium is low, per Dr. Burr Medico take 2 tabs daily.  Patient states that she often forgets to take them, explained the importance and she will make sure she is taking 2 every day.

## 2018-11-28 ENCOUNTER — Telehealth: Payer: Self-pay | Admitting: Obstetrics & Gynecology

## 2018-11-28 NOTE — Telephone Encounter (Signed)
Patient is currently experiencing itching and has a vaginal rash.

## 2018-11-28 NOTE — Telephone Encounter (Signed)
Spoke with patient. Patient states that she has been having ongoing vaginal itching x 3 weeks. Used OTC Monistat 3 and hydrocortisone cream without relief. Denies any vaginal redness,vaginal discharge, or pain. Appointment scheduled for tomorrow 11/29/2018 at 1 pm with Dr.Silva. Patient is agreeable to date and time.  Routing to provider and will close encounter.

## 2018-11-29 ENCOUNTER — Other Ambulatory Visit: Payer: Self-pay

## 2018-11-29 ENCOUNTER — Ambulatory Visit: Payer: Medicare Other | Admitting: Obstetrics and Gynecology

## 2018-11-29 VITALS — BP 132/76 | HR 92 | Temp 98.5°F | Ht 62.0 in | Wt 221.0 lb

## 2018-11-29 DIAGNOSIS — N760 Acute vaginitis: Secondary | ICD-10-CM | POA: Diagnosis not present

## 2018-11-29 MED ORDER — FLUCONAZOLE 150 MG PO TABS: 150.0000 mg | ORAL_TABLET | Freq: Once | ORAL | 0 refills | Status: AC

## 2018-11-29 MED ORDER — TRIAMCINOLONE ACETONIDE 0.025 % EX OINT: 1.0000 "application " | TOPICAL_OINTMENT | Freq: Two times a day (BID) | CUTANEOUS | 0 refills | Status: DC

## 2018-11-29 MED ORDER — NYSTATIN 100000 UNIT/GM EX OINT: 1.0000 "application " | TOPICAL_OINTMENT | Freq: Two times a day (BID) | CUTANEOUS | 0 refills | Status: DC

## 2018-11-29 NOTE — Patient Instructions (Signed)
Vaginitis  Vaginitis is a condition in which the vaginal tissue swells and becomes red (inflamed). This condition is most often caused by a change in the normal balance of bacteria and yeast that live in the vagina. This change causes an overgrowth of certain bacteria or yeast, which causes the inflammation. There are different types of vaginitis, but the most common types are:   Bacterial vaginosis.   Yeast infection (candidiasis).   Trichomoniasis vaginitis. This is a sexually transmitted disease (STD).   Viral vaginitis.   Atrophic vaginitis.   Allergic vaginitis.  What are the causes?  The cause of this condition depends on the type of vaginitis. It can be caused by:   Bacteria (bacterial vaginosis).   Yeast, which is a fungus (yeast infection).   A parasite (trichomoniasis vaginitis).   A virus (viral vaginitis).   Low hormone levels (atrophic vaginitis). Low hormone levels can occur during pregnancy, breastfeeding, or after menopause.   Irritants, such as bubble baths, scented tampons, and feminine sprays (allergic vaginitis).  Other factors can change the normal balance of the yeast and bacteria that live in the vagina. These include:   Antibiotic medicines.   Poor hygiene.   Diaphragms, vaginal sponges, spermicides, birth control pills, and intrauterine devices (IUD).   Sex.   Infection.   Uncontrolled diabetes.   A weakened defense (immune) system.  What increases the risk?  This condition is more likely to develop in women who:   Smoke.   Use vaginal douches, scented tampons, or scented sanitary pads.   Wear tight-fitting pants.   Wear thong underwear.   Use oral birth control pills or an IUD.   Have sex without a condom.   Have multiple sex partners.   Have an STD.   Frequently use the spermicide nonoxynol-9.   Eat lots of foods high in sugar.   Have uncontrolled diabetes.   Have low estrogen levels.   Have a weakened immune system from an immune disorder or medical  treatment.   Are pregnant or breastfeeding.  What are the signs or symptoms?  Symptoms vary depending on the cause of the vaginitis. Common symptoms include:   Abnormal vaginal discharge.  ? The discharge is white, gray, or yellow with bacterial vaginosis.  ? The discharge is thick, white, and cheesy with a yeast infection.  ? The discharge is frothy and yellow or greenish with trichomoniasis.   A bad vaginal smell. The smell is fishy with bacterial vaginosis.   Vaginal itching, pain, or swelling.   Sex that is painful.   Pain or burning when urinating.  Sometimes there are no symptoms.  How is this diagnosed?  This condition is diagnosed based on your symptoms and medical history. A physical exam, including a pelvic exam, will also be done. You may also have other tests, including:   Tests to determine the pH level (acidity or alkalinity) of your vagina.   A whiff test, to assess the odor that results when a sample of your vaginal discharge is mixed with a potassium hydroxide solution.   Tests of vaginal fluid. A sample will be examined under a microscope.  How is this treated?  Treatment varies depending on the type of vaginitis you have. Your treatment may include:   Antibiotic creams or pills to treat bacterial vaginosis and trichomoniasis.   Antifungal medicines, such as vaginal creams or suppositories, to treat a yeast infection.   Medicine to ease discomfort if you have viral vaginitis. Your sexual partner   should also be treated.   Estrogen delivered in a cream, pill, suppository, or vaginal ring to treat atrophic vaginitis. If vaginal dryness occurs, lubricants and moisturizing creams may help. You may need to avoid scented soaps, sprays, or douches.   Stopping use of a product that is causing allergic vaginitis. Then using a vaginal cream to treat the symptoms.  Follow these instructions at home:  Lifestyle   Keep your genital area clean and dry. Avoid soap, and only rinse the area with  water.   Do not douche or use tampons until your health care provider says it is okay to do so. Use sanitary pads, if needed.   Do not have sex until your health care provider approves. When you can return to sex, practice safe sex and use condoms.   Wipe from front to back. This avoids the spread of bacteria from the rectum to the vagina.  General instructions   Take over-the-counter and prescription medicines only as told by your health care provider.   If you were prescribed an antibiotic medicine, take or use it as told by your health care provider. Do not stop taking or using the antibiotic even if you start to feel better.   Keep all follow-up visits as told by your health care provider. This is important.  How is this prevented?   Use mild, non-scented products. Do not use things that can irritate the vagina, such as fabric softeners. Avoid the following products if they are scented:  ? Feminine sprays.  ? Detergents.  ? Tampons.  ? Feminine hygiene products.  ? Soaps or bubble baths.   Let air reach your genital area.  ? Wear cotton underwear to reduce moisture buildup.  ? Avoid wearing underwear while you sleep.  ? Avoid wearing tight pants and underwear or nylons without a cotton panel.  ? Avoid wearing thong underwear.   Take off any wet clothing, such as bathing suits, as soon as possible.   Practice safe sex and use condoms.  Contact a health care provider if:   You have abdominal pain.   You have a fever.   You have symptoms that last for more than 2-3 days.  Get help right away if:   You have a fever and your symptoms suddenly get worse.  Summary   Vaginitis is a condition in which the vaginal tissue becomes inflamed.This condition is most often caused by a change in the normal balance of bacteria and yeast that live in the vagina.   Treatment varies depending on the type of vaginitis you have.   Do not douche, use tampons , or have sex until your health care provider approves. When  you can return to sex, practice safe sex and use condoms.  This information is not intended to replace advice given to you by your health care provider. Make sure you discuss any questions you have with your health care provider.  Document Released: 05/17/2007 Document Revised: 08/25/2016 Document Reviewed: 08/25/2016  Elsevier Interactive Patient Education  2019 [employer]

## 2018-11-29 NOTE — Progress Notes (Signed)
GYNECOLOGY  VISIT   HPI: 73 y.o.   Single  African American  female   [phone] with Patient's last menstrual period was 08/03/1984 (approximate).   here for vaginal itching, rash, burning x 2 weeks.  No odor.  Denies discharge.   States she has blisters continuously for 2 weeks.   Not sexually active.   Using Replens due to dryness.  Tried Monistat and Terazol and symptoms were not improved significantly.  Last used cream 2 days ago.   Wears Poise pads.   States her A1C is 6.4.  She is not on medication for control of her blood sugar.   GYNECOLOGIC HISTORY: Patient's last menstrual period was 08/03/1984 (approximate). Contraception: hysterectomy  Menopausal hormone therapy: none Last mammogram:  MRI bilateral 08/14/18 BIRADS2:Benign.  Last pap smear:   09/14/16 Neg         OB History    Gravida  2   Para  2   Term  2   Preterm  0   AB  0   Living  2     SAB  0   TAB  0   Ectopic  0   Multiple  0   Live Births  2              Patient Active Problem List   Diagnosis Date Noted  . Genetic testing 02/04/2018  . Family history of pancreatic cancer   . Family history of prostate cancer   . Family history of breast cancer   . Malignant neoplasm of upper-outer quadrant of right breast in female, estrogen receptor positive ([place]) 01/07/2018  . Moderate persistent asthma without complication 97/94/8016  . Seasonal and perennial allergic rhinitis 04/26/2017  . Allergy 01/28/2017  . Allergic rhinitis 11/28/2015  . Cough 11/28/2015  . Breast pain 08/25/2012  . Left breast mass 10/29/2011  . Symptomatic cholecystitis 02/24/2011  . HYPOTHYROIDISM 10/02/2010  . HYPERTENSION 10/02/2010  . G E R D 10/02/2010  . OSTEOARTHRITIS 10/02/2010    Past Medical History:  Diagnosis Date  . Acid reflux   . Allergic rhinitis   . Arthritis   . Asthma   . Breast lump   . Bruises easily   . Cancer ([place])   . Chicken pox   . Colon polyp   . Family history of breast  cancer   . Family history of pancreatic cancer   . Family history of prostate cancer   . Gum disease   . Hypertension   . Hypothyroidism   . Incontinence   . Measles   . Myopathy    arms and legs, statin drug related  . Night sweats   . Nipple discharge   . Pre-diabetes   . Prediabetes 06/2014    Past Surgical History:  Procedure Laterality Date  . ABDOMINAL HYSTERECTOMY  1986   secondary to fibroids  . BREAST LUMPECTOMY WITH RADIOACTIVE SEED AND SENTINEL LYMPH NODE BIOPSY Right 03/09/2018   Procedure: RIGHT BREAST LUMPECTOMY WITH BRACKETED RADIOACTIVE SEED AND SENTINEL LYMPH NODE BIOPSY;  Surgeon: Rolm Bookbinder, MD;  Location: [facility];  Service: General;  Laterality: Right;  . BREAST SURGERY  07/09/2008   mass removal  . CHOLECYSTECTOMY  03/17/11  . RE-EXCISION OF BREAST LUMPECTOMY Right 03/31/2018   Procedure: RE-EXCISION OF BREAST LUMPECTOMY;  Surgeon: Rolm Bookbinder, MD;  Location: [facility];  Service: General;  Laterality: Right;  . SKIN TAG REMOVAL     brow and lid  . THIGH /  KNEE SOFT TISSUE BIOPSY  09/05/2009  . TUBAL LIGATION  1980    Current Outpatient Medications  Medication Sig Dispense Refill  . Albuterol Sulfate (PROAIR RESPICLICK) 038 (90 Base) MCG/ACT AEPB Inhale 2 puffs into the lungs every 4 (four) hours as needed. 1 each 0  . anastrozole (ARIMIDEX) 1 MG tablet Take 1 tablet (1 mg total) by mouth daily. 30 tablet 5  . CALCIUM PO Take 1 tablet by mouth daily. Reported on 01/06/2016    . cetirizine (ZYRTEC) 10 MG tablet Take 10 mg by mouth daily.      . fluticasone furoate-vilanterol (BREO ELLIPTA) 200-25 MCG/INH AEPB Inhale 1 puff into the lungs daily. 30 each 5  . irbesartan (AVAPRO) 300 MG tablet Take 300 mg by mouth daily.     Marland Kitchen levothyroxine (SYNTHROID, LEVOTHROID) 112 MCG tablet Take 1 tablet by mouth daily.    . Multiple Vitamins-Minerals (MULTIVITAMINS THER. W/MINERALS) TABS Take 1 tablet by mouth daily.    Marland Kitchen  omeprazole (PRILOSEC) 40 MG capsule Take 1 capsule by mouth daily.    . Vitamin D, Ergocalciferol, (DRISDOL) 50000 units CAPS capsule Take 1 capsule by mouth once a week.  3   No current facility-administered medications for this visit.      ALLERGIES: Statins; Adhesive [tape]; Hydrogen peroxide; Lidocaine; Metrogel [metronidazole]; Latex; Neomycin-bacitracin zn-polymyx; Sulfa antibiotics; and Sulfonamide derivatives  Family History  Problem Relation Age of Onset  . Hypertension Mother   . Pancreatic cancer Mother 13  . Hypertension Father   . Prostate cancer Father 6  . Emphysema Father   . Hypertension Sister   . Breast cancer Sister        dx >50, caught very early  . Hypertension Brother   . Alzheimer's disease Brother   . Hypertension Sister   . Heart failure Brother   . Hypertension Brother   . Breast cancer Daughter 59       BRCA1/2 negative, never had panel testing  . Allergies Sister   . Allergies Brother   . Throat cancer Maternal Uncle     Social History   Socioeconomic History  . Marital status: Single    Spouse name: Not on file  . Number of children: Not on file  . Years of education: Not on file  . Highest education level: Not on file  Occupational History  . Not on file  Social Needs  . Financial resource strain: Not on file  . Food insecurity:    Worry: Not on file    Inability: Not on file  . Transportation needs:    Medical: Not on file    Non-medical: Not on file  Tobacco Use  . Smoking status: Former Smoker    Packs/day: 1.00    Years: 48.00    Pack years: 48.00    Types: Cigarettes    Last attempt to quit: 08/03/2008    Years since quitting: 10.3  . Smokeless tobacco: Never Used  Substance and Sexual Activity  . Alcohol use: Yes    Alcohol/week: 0.0 standard drinks    Comment: rare  . Drug use: No  . Sexual activity: Not Currently  Lifestyle  . Physical activity:    Days per week: Not on file    Minutes per session: Not on file   . Stress: Not on file  Relationships  . Social connections:    Talks on phone: Not on file    Gets together: Not on file    Attends religious service: Not  on file    Active member of club or organization: Not on file    Attends meetings of clubs or organizations: Not on file    Relationship status: Not on file  . Intimate partner violence:    Fear of current or ex partner: Not on file    Emotionally abused: Not on file    Physically abused: Not on file    Forced sexual activity: Not on file  Other Topics Concern  . Not on file  Social History Narrative   Single, lives with daughter   2 children   Retired - Glass blower/designer   No recent travel      Financial controller Pulmonary:   Previously worked [employer]. From [place] and has always lived in [place]. No international travel. No pets currently. No bird or mold exposure.     Review of Systems  Genitourinary: Positive for vaginal pain.       Vaginal itching   Skin: Positive for rash.  All other systems reviewed and are negative.   PHYSICAL EXAMINATION:    Ht '5\' 2"'  (1.575 m)   Wt 221 lb (100.2 kg)   LMP 08/03/1984 (Approximate)   BMI 40.42 kg/m     General appearance: alert, cooperative and appears stated age   Pelvic: External genitalia:  Grey tone to skin of vulva.  Fissure of the flexural fold between vulva and right thigh.               Urethra:  normal appearing urethra with no masses, tenderness or lesions              Bartholins and Skenes: normal                 Vagina: normal appearing vagina with normal color and discharge, no lesions              Cervix: Absent                Bimanual Exam:  Uterus:   Absent.              Adnexa: no mass, fullness, tenderness             Chaperone was present for exam.  ASSESSMENT  Vulvovaginitis.  Looks like Candida.  PLAN  Affirm.  Diflucan 150 mg po x 1.  Repeat in 72hrs prn.  Nystatin and Triamcinolone ointment.  Mix in 1 to 1 ratio and use on vulva bid for one week.     An After Visit Summary was printed and given to the patient.  _15_____ minutes face to face time of which over 50% was spent in counseling.

## 2018-11-30 LAB — VAGINITIS/VAGINOSIS, DNA PROBE
Candida Species: NEGATIVE
Gardnerella vaginalis: NEGATIVE
Trichomonas vaginosis: NEGATIVE

## 2018-12-01 ENCOUNTER — Encounter: Payer: Self-pay | Admitting: Obstetrics and Gynecology

## 2018-12-05 ENCOUNTER — Telehealth: Payer: Self-pay

## 2018-12-05 NOTE — Telephone Encounter (Signed)
Left message to let patient know that she has results that were released to her that she needed to view. Patient told to call us back if she has any problems or questions regarding them.

## 2018-12-08 ENCOUNTER — Encounter: Payer: Self-pay | Admitting: Hematology

## 2018-12-08 ENCOUNTER — Telehealth: Payer: Self-pay

## 2018-12-08 NOTE — Telephone Encounter (Signed)
Patient's daughter Sharl Ma called with concerns over her mother's increased risk for COVID-19.  The daughter is her mother's primary care giver and she currently works in an environment that is an office setting with shared work spaces.  They do not have PPE at her work place.   She is requesting a letter stating her mother's condition and risk factors.  Patrice could possible have an opportunity to work from home with this letter.  Her [phone]

## 2018-12-13 ENCOUNTER — Encounter: Payer: Self-pay | Admitting: Nurse Practitioner

## 2018-12-13 NOTE — Telephone Encounter (Signed)
I completed the letter and sent it to the patient. Please let her daughter know it's done. I also printed it in case she did not receive it and needs to be faxed.  Thanks, Regan Rakers NP

## 2018-12-13 NOTE — Telephone Encounter (Signed)
Lacie, I did the letter last week, if [facility] sent it out already, then you do not need send again. Thanks   Truitt Merle MD

## 2018-12-14 NOTE — Telephone Encounter (Signed)
Oh OK, well now there's a duplicate.  Thanks,  Regan Rakers

## 2019-01-02 NOTE — Progress Notes (Deleted)
I connected with _0 @ on 01/02/19 at  9:45 AM EDT by telephone and verified that I am speaking with the correct person using two identifiers.  I discussed the limitations, risks, security and privacy concerns of performing an evaluation and management service by telephone and the availability of in person appointments. I also discussed with the patient that there may be a patient responsible charge related to this service. The patient expressed understanding and agreed to proceed.   Patient Care Team: [name], DO as PCP - General (Family Medicine) [name], MD as Consulting Physician (General Surgery) Truitt Merle, MD as Consulting Physician (Hematology) Gery Pray, MD as Consulting Physician (Radiation Oncology) Alla Feeling, NP as Nurse Practitioner (Nurse Practitioner)   BRIEF ONCOLOGIC HISTORY:  Oncology History   Cancer Staging Malignant neoplasm of upper-outer quadrant of right breast in female, estrogen receptor positive ([facility]) Staging form: Breast, AJCC 8th Edition - Clinical stage from 01/04/2018: Stage IA (cT1c, cN0, cM0, G2, ER+, PR+, HER2-) - Signed by Truitt Merle, MD on 01/11/2018 - Pathologic: Stage IA (pT2, pN0, cM0, G2, ER+, PR+, HER2-) - Signed by Nicholas Lose, MD on 06/21/2018       Malignant neoplasm of upper-outer quadrant of right breast in female, estrogen receptor positive ([facility])   12/28/2017 Mammogram    Bilateral diagnostic mammography with tomography and right breast ultrasonography at [facility] on 12/28/2017 showing: The irregular architectural distortion in the right breast upper outer quadrant posterior depth is indeterminate. The architectural distortion in the right breast upper outer quadrant middle depth is indeterminate.     01/03/2018 Pathology Results    Right needle core biopsy with pathology showing: Breast, right, needle core biopsy, (A) 11 o'clock with invasive mammary carcinoma, grade I-II. Breast, right, needle core biopsy, (B) 11 o'clock with  invasive mammary carcinoma, grade I-II. Prognostic indicators significant for: ER, 60% positive with moderate staining intensity and PR, 90% positive with strong staining intensity. Proliferation marker Ki67 at 1%. HER2 negative.    01/04/2018 Cancer Staging    Staging form: Breast, AJCC 8th Edition - Clinical stage from 01/04/2018: Stage IA (cT1c, cN0, cM0, G2, ER+, PR+, HER2-) - Signed by Truitt Merle, MD on 01/11/2018    01/25/2018 Genetic Testing    The Multi-Cancer Panel offered by [facility] includes sequencing and/or deletion duplication testing of the following 83 genes: ALK, APC, ATM, AXIN2,BAP1,  BARD1, BLM, BMPR1A, BRCA1, BRCA2, BRIP1, CASR, CDC73, CDH1, CDK4, CDKN1B, CDKN1C, CDKN2A (p14ARF), CDKN2A (p16INK4a), CEBPA, CHEK2, CTNNA1, DICER1, DIS3L2, EGFR (c.2369C>T, p.Thr790Met variant only), EPCAM (Deletion/duplication testing only), FH, FLCN, GATA2, GPC3, GREM1 (Promoter region deletion/duplication testing only), HOXB13 (c.251G>A, p.Gly84Glu), HRAS, KIT, MAX, MEN1, MET, MITF (c.952G>A, p.Glu318Lys variant only), MLH1, MSH2, MSH3, MSH6, MUTYH, NBN, NF1, NF2, NTHL1, PALB2, PDGFRA, PHOX2B, PMS2, POLD1, POLE, POT1, PRKAR1A, PTCH1, PTEN, RAD50, RAD51C, RAD51D, RB1, RECQL4, RET, RUNX1, SDHAF2, SDHA (sequence changes only), SDHB, SDHC, SDHD, SMAD4, SMARCA4, SMARCB1, SMARCE1, STK11, SUFU, TERC, TERT, TMEM127, TP53, TSC1, TSC2, VHL, WRN and WT1.   Results: No pathogenic variants identified.  A Variant of Uncertain significance in BAP1 was identified c.1066C>T (p.Arg356Trp).  The date of this test report is 01/25/2018.     03/09/2018 Surgery    Right lumpectomy: ILC grade 2, 2 foci, 2 cm, 2.5 cm, superior margin positive, 0/5 lymph nodes negative, ER 60%, PR 90%, Ki-67 1%, HER-2 negative T2N0 stage Ia    03/09/2018 Oncotype testing    Recurrence score 17 Distant risk of recurrence at 9 years with AI or Tamoxifen  alone is 5% There is a <1% benefit of chemotherapy    03/31/2018 Pathology Results    Reexcision: No  residual cancer    05/09/2018 - 07/08/2018 Radiation Therapy    Radiation with Dr. Sondra Come 05/09/18- 07/08/18     06/21/2018 Cancer Staging    Staging form: Breast, AJCC 8th Edition - Pathologic: Stage IA (pT2, pN0, cM0, G2, ER+, PR+, HER2-) - Signed by Nicholas Lose, MD on 06/21/2018    07/2018 -  Anti-estrogen oral therapy    Anastrozole 69m daily starting 07/2018     Survivorship    Per LCira Rue NP      INTERVAL HISTORY:  Ms. BHaugento review her survivorship care plan detailing her treatment course for breast cancer, as well as monitoring long-term side effects of that treatment, education regarding health maintenance, screening, and overall wellness and health promotion.     Overall, Ms. BOwensbyreports feeling quite well since completing her radiation therapy approximately 3 months ago.  She ***    REVIEW OF SYSTEMS:  Review of Systems - Oncology Breast: Denies any new nodularity, masses, tenderness, nipple changes, or nipple discharge.      ONCOLOGY TREATMENT TEAM:  1. [facility]geon[facility]  [facility] 2. Medical Oncologist: Dr. FBurr Medico3. Radiation Oncologist: Dr. KSondra Come    PAST MEDICAL/SURGICAL HISTORY:  Past Medical History:  Diagnosis Date  . Acid reflux   . Allergic rhinitis   . Arthritis   . Asthma   . Breast lump   . Bruises easily   . Cancer ([facility]   . Chicken pox   . Colon polyp   . Family history of breast cancer   . Family history of pancreatic cancer   . Family history of prostate cancer   . Gum disease   . Hypertension   . Hypothyroidism   . Incontinence   . Measles   . Myopathy    arms and legs, statin drug related  . Night sweats   . Nipple discharge   . Pre-diabetes   . Prediabetes 06/2014   Past Surgical History:  Procedure Laterality Date  . ABDOMINAL HYSTERECTOMY  1986   secondary to fibroids  . BREAST LUMPECTOMY WITH RADIOACTIVE SEED AND SENTINEL LYMPH NODE BIOPSY Right 03/09/2018   Procedure: RIGHT BREAST  LUMPECTOMY WITH BRACKETED RADIOACTIVE SEED AND SENTINEL LYMPH NODE BIOPSY;  Surgeon: WRolm Bookbinder MD;  [place] [facility]  Service: General;  Laterality: Right;  . BREAST SURGERY  07/09/2008   mass removal  . CHOLECYSTECTOMY  03/17/11  . RE-EXCISION OF BREAST LUMPECTOMY Right 03/31/2018   Procedure: RE-EXCISION OF BREAST LUMPECTOMY;  Surgeon: WRolm Bookbinder MD;  [place] [facility]  Service: General;  Laterality: Right;  . SKIN TAG REMOVAL     brow and lid  . THIGH / KNEE SOFT TISSUE BIOPSY  09/05/2009  . TUBAL LIGATION  1980     ALLERGIES:  Allergies  Allergen Reactions  . Statins Other (See Comments)    Medication Crestor:   Urine became very dark within two days.   Nerve damage and myopathy as a result.   . Adhesive [Tape]   . Hydrogen Peroxide Other (See Comments)    Blisters   . Lidocaine Other (See Comments)    Combination of Lidocaine and Epinephrine during dental procedure caused her to "nearly arrest".   . Metrogel [Metronidazole] Other (See Comments)    Increase in vaginal burning.  Does better with Flagyl  . Latex  Rash  . Neomycin-Bacitracin Zn-Polymyx Rash  . Sulfa Antibiotics Rash  . Sulfonamide Derivatives Rash     CURRENT MEDICATIONS:  Outpatient Encounter Medications as of 01/10/2019  Medication Sig  . Albuterol Sulfate (PROAIR RESPICLICK) 341 (90 Base) MCG/ACT AEPB Inhale 2 puffs into the lungs every 4 (four) hours as needed.  Marland Kitchen anastrozole (ARIMIDEX) 1 MG tablet Take 1 tablet (1 mg total) by mouth daily.  Marland Kitchen CALCIUM PO Take 1 tablet by mouth daily. Reported on 01/06/2016  . cetirizine (ZYRTEC) 10 MG tablet Take 10 mg by mouth daily.    . fluticasone furoate-vilanterol (BREO ELLIPTA) 200-25 MCG/INH AEPB Inhale 1 puff into the lungs daily.  . irbesartan (AVAPRO) 300 MG tablet Take 300 mg by mouth daily.   Marland Kitchen levothyroxine (SYNTHROID, LEVOTHROID) 112 MCG tablet Take 1 tablet by mouth daily.  . Multiple Vitamins-Minerals  (MULTIVITAMINS THER. W/MINERALS) TABS Take 1 tablet by mouth daily.  Marland Kitchen nystatin ointment (MYCOSTATIN) Apply 1 application topically 2 (two) times daily. Apply to affected area for up to 7 days.  Marland Kitchen omeprazole (PRILOSEC) 40 MG capsule Take 1 capsule by mouth daily.  Marland Kitchen triamcinolone (KENALOG) 0.025 % ointment Apply 1 application topically 2 (two) times daily. Apply to affected area for 7 days.  . Vitamin D, Ergocalciferol, (DRISDOL) 50000 units CAPS capsule Take 1 capsule by mouth once a week.   No facility-administered encounter medications on file as of 01/10/2019.      ONCOLOGIC FAMILY HISTORY:  Family History  Problem Relation Age of Onset  . Hypertension Mother   . Pancreatic cancer Mother 75  . Hypertension Father   . Prostate cancer Father 63  . Emphysema Father   . Hypertension Sister   . Breast cancer Sister        dx >50, caught very early  . Hypertension Brother   . Alzheimer's disease Brother   . Hypertension Sister   . Heart failure Brother   . Hypertension Brother   . Breast cancer Daughter 74       BRCA1/2 negative, never had panel testing  . Allergies Sister   . Allergies Brother   . Throat cancer Maternal Uncle      GENETIC COUNSELING/TESTING: Complete Results Gene Variant Zygosity Variant Classification BAP1 c.1066C>T (p.Arg356Trp) heterozygous Uncertain Significance The following genes were evaluated for sequence changes and exonic deletions/duplications: ALK, APC, ATM, AXIN2, BAP1, BARD1, BLM, BMPR1A, BRCA1, BRCA2, BRIP1, CASR, CDC73, CDH1, CDK4, CDKN1B, CDKN1C, CDKN2A (p14ARF), CDKN2A (p16INK4a), CEBPA, CHEK2, CTNNA1, DICER1, DIS3L2, EPCAM*, FH, FLCN, GATA2, GPC3, GREM1*, HRAS, KIT, MAX, MEN1, MET, MLH1, MSH2, MSH3, MSH6, MUTYH, NBN, NF1, NF2, PALB2, PDGFRA, PHOX2B*, PMS2, POLD1, POLE, POT1, PRKAR1A, PTCH1, PTEN, RAD50, RAD51C, RAD51D, RB1, RECQL4, RET, RUNX1, SDHAF2, SDHB, SDHC, SDHD, SMAD4, SMARCA4, SMARCB1, SMARCE1, STK11, SUFU, TERC, TERT, TMEM127, TP53,  TSC1, TSC2, VHL, WRN*, WT1 The following genes were evaluated for sequence changes only: EGFR*, HOXB13*, MITF*, NTHL1*, SDHA Results are negative unless otherwise indicated   SOCIAL HISTORY:  Social History   Socioeconomic History  . Marital status: Single    Spouse name: Not on file  . Number of children: Not on file  . Years of education: Not on file  . Highest education level: Not on file  Occupational History  . Not on file  Social Needs  . Financial resource strain: Not on file  . Food insecurity:    Worry: Not on file    Inability: Not on file  . Transportation needs:    Medical: Not on file  Non-medical: Not on file  Tobacco Use  . Smoking status: Former Smoker    Packs/day: 1.00    Years: 48.00    Pack years: 48.00    Types: Cigarettes    Last attempt to quit: 08/03/2008    Years since quitting: 10.4  . Smokeless tobacco: Never Used  Substance and Sexual Activity  . Alcohol use: Yes    Alcohol/week: 0.0 standard drinks    Comment: rare  . Drug use: No  . Sexual activity: Not Currently  Lifestyle  . Physical activity:    Days per week: Not on file    Minutes per session: Not on file  . Stress: Not on file  Relationships  . Social connections:    Talks on phone: Not on file    Gets together: Not on file    Attends religious service: Not on file    Active member of club or organization: Not on file    Attends meetings of clubs or organizations: Not on file    Relationship status: Not on file  . Intimate partner violence:    Fear of current or ex partner: Not on file    Emotionally abused: Not on file    Physically abused: Not on file    Forced sexual activity: Not on file  Other Topics Concern  . Not on file  Social History Narrative   Single, lives with daughter   2 children   Retired - Glass blower/designer   No recent travel      Financial controller Pulmonary:   Previously worked [employer]. From [place] and has always lived in [place]. No international travel.  No pets currently. No bird or mold exposure.      OBSERVATIONS/OBJECTIVE:   LABORATORY DATA:  None for this visit.  DIAGNOSTIC IMAGING:  None for this visit.      ASSESSMENT AND PLAN:  Ms.. Ammar is a pleasant 73 y.o. female with Stage I right breast invasive lobular carcinoma, ER+/PR+/HER2-, diagnosed in 01/2017, treated with lumpectomy, adjuvant radiation therapy, and anti-estrogen therapy with anastrozole beginning in 07/2018.  She presents to the Survivorship Clinic for our initial meeting and routine follow-up post-completion of treatment for breast cancer.    1. Stage I right breast cancer:  Ms. Haft is continuing to recover from definitive treatment for breast cancer. She will follow-up with her medical oncologist, Dr. Burr Medico in *** with history and physical exam per surveillance protocol.  She will continue her anti-estrogen therapy with anastrozole. Thus far, she is tolerating the *** well, with minimal side effects. She was instructed to make Dr. Burr Medico or myself aware if she begins to experience any worsening side effects of the medication and I could see her back in clinic to help manage those side effects, as needed. Her mammogram is due this month, 01/2019; orders placed today.  Her breast density is category C, the breast tissue is dense which may obscure small masses. Today, a comprehensive survivorship care plan and treatment summary was reviewed with the patient today detailing her breast cancer diagnosis, treatment course, potential late/long-term effects of treatment, appropriate follow-up care with recommendations for the future, and patient education resources.  A copy of this summary, along with a letter will be sent to the patient's primary care provider via mail/fax/In Basket message after today's visit.    #. Problem(s) at Visit______________  #. Bone health:  Given Ms. Arana's age/history of breast cancer and her current treatment regimen including anti-estrogen  therapy with anastrozole, she  is at risk for bone demineralization.  Her last DEXA scan was in 01/2017, which showed osteopenia (T-score -2.2 at right femur neck. Next DEXA is due now, she will call [facility] to schedule.  In the meantime, she was encouraged to increase her consumption of foods rich in calcium, as well as increase her weight-bearing activities.  She was given education on specific activities to promote bone health.  #. Cancer screening:  Due to Ms. Beaulac's history and her age, she should receive screening for skin cancers, colon cancer, and gynecologic cancers.  The information and recommendations are listed on the patient's comprehensive care plan/treatment summary and were reviewed in detail with the patient.    #. Health maintenance and wellness promotion: Ms. Oaxaca was encouraged to consume 5-7 servings of fruits and vegetables per day. We reviewed the "Nutrition Rainbow" handout, as well as the handout "Take Control of Your Health and Reduce Your Cancer Risk" from the [facility].  She was also encouraged to engage in moderate to vigorous exercise for 30 minutes per day most days of the week. We discussed the [employer] fitness program, which is designed for cancer survivors to help them become more physically fit after cancer treatments.  She was instructed to limit her alcohol consumption and continue to abstain from tobacco use/***was encouraged stop smoking.     #. Support services/counseling: It is not uncommon for this period of the patient's cancer care trajectory to be one of many emotions and stressors.  We discussed how this can be increasingly difficult during the times of quarantine and social distancing due to the COVID-19 pandemic.   She was given information regarding our available services and encouraged to contact me with any questions or for help enrolling in any of our support group/programs.    Follow up instructions:    -Return to cancer center  04/2019 -Mammogram due now, call [facility] to schedule  -Follow up with surgery as needed  -She is welcome to return back to the Survivorship Clinic at any time; no additional follow-up needed at this time.  -Consider referral back to survivorship as a long-term survivor for continued surveillance The patient was provided an opportunity to ask questions and all were answered. The patient agreed with the plan and demonstrated an understanding of the instructions.  The patient was advised to call back or seek an in-person evaluation if the symptoms worsen or if the condition fails to improve as anticipated.  I provided *** minutes of non-face-to-face time during this encounter.  Alla Feeling, NP

## 2019-01-09 ENCOUNTER — Telehealth: Payer: Self-pay | Admitting: Nurse Practitioner

## 2019-01-09 NOTE — Telephone Encounter (Signed)
Called patient regarding upcoming [facility] appointment, since patient did receive her package this will be rescheduled for 06/15. Patient Is notified and this will be a telephone visit.

## 2019-01-10 ENCOUNTER — Inpatient Hospital Stay: Payer: Medicare Other | Admitting: Nurse Practitioner

## 2019-01-11 ENCOUNTER — Other Ambulatory Visit: Payer: Self-pay | Admitting: Hematology

## 2019-01-12 ENCOUNTER — Encounter: Payer: Medicare Other | Admitting: Adult Health

## 2019-01-12 DIAGNOSIS — G729 Myopathy, unspecified: Secondary | ICD-10-CM | POA: Diagnosis not present

## 2019-01-12 DIAGNOSIS — E119 Type 2 diabetes mellitus without complications: Secondary | ICD-10-CM | POA: Diagnosis not present

## 2019-01-12 DIAGNOSIS — E039 Hypothyroidism, unspecified: Secondary | ICD-10-CM | POA: Diagnosis not present

## 2019-01-17 ENCOUNTER — Other Ambulatory Visit: Payer: Self-pay | Admitting: General Surgery

## 2019-01-17 ENCOUNTER — Telehealth: Payer: Self-pay | Admitting: Nurse Practitioner

## 2019-01-17 DIAGNOSIS — C50411 Malignant neoplasm of upper-outer quadrant of right female breast: Secondary | ICD-10-CM | POA: Diagnosis not present

## 2019-01-17 DIAGNOSIS — Z170 Estrogen receptor positive status [ER+]: Secondary | ICD-10-CM | POA: Diagnosis not present

## 2019-01-17 NOTE — Telephone Encounter (Signed)
Called to verify appt no answer left mess on machine.

## 2019-01-17 NOTE — Progress Notes (Signed)
I connected with [name] on 01/18/19 at 2:55 PM EDT by telephone and verified that I am speaking with the correct person using two identifiers.  I discussed the limitations, risks, security and privacy concerns of performing an evaluation and management service by telephone and the availability of in person appointments. I also discussed with the patient that there may be a patient responsible charge related to this service. The patient expressed understanding and agreed to proceed.   Patient's location: home  Provider's location: [facility] office  CC: Survivorship care plan   Patient Care Team: [name], DO as PCP - General (Family Medicine) [name], MD as Consulting Physician (General Surgery) Truitt Merle, MD as Consulting Physician (Hematology) Gery Pray, MD as Consulting Physician (Radiation Oncology) Alla Feeling, NP as Nurse Practitioner (Nurse Practitioner)  BRIEF ONCOLOGIC HISTORY:  Oncology History Overview Note  Cancer Staging Malignant neoplasm of upper-outer quadrant of right breast in female, estrogen receptor positive ([place]) Staging form: Breast, AJCC 8th Edition - Clinical stage from 01/04/2018: Stage IA (cT1c, cN0, cM0, G2, ER+, PR+, HER2-) - Signed by Truitt Merle, MD on 01/11/2018 - Pathologic: Stage IA (pT2, pN0, cM0, G2, ER+, PR+, HER2-) - Signed by Nicholas Lose, MD on 06/21/2018     Malignant neoplasm of upper-outer quadrant of right breast in female, estrogen receptor positive ([place])  12/28/2017 Mammogram   Bilateral diagnostic mammography with tomography and right breast ultrasonography at [facility] on 12/28/2017 showing: The irregular architectural distortion in the right breast upper outer quadrant posterior depth is indeterminate. The architectural distortion in the right breast upper outer quadrant middle depth is indeterminate.    01/03/2018 Pathology Results   Right needle core biopsy with pathology showing: Breast, right, needle core biopsy, (A) 11  o'clock with invasive mammary carcinoma, grade I-II. Breast, right, needle core biopsy, (B) 11 o'clock with invasive mammary carcinoma, grade I-II. Prognostic indicators significant for: ER, 60% positive with moderate staining intensity and PR, 90% positive with strong staining intensity. Proliferation marker Ki67 at 1%. HER2 negative.   01/04/2018 Cancer Staging   Staging form: Breast, AJCC 8th Edition - Clinical stage from 01/04/2018: Stage IA (cT1c, cN0, cM0, G2, ER+, PR+, HER2-) - Signed by Truitt Merle, MD on 01/11/2018   01/25/2018 Genetic Testing   The Multi-Cancer Panel offered by [facility] includes sequencing and/or deletion duplication testing of the following 83 genes: ALK, APC, ATM, AXIN2,BAP1,  BARD1, BLM, BMPR1A, BRCA1, BRCA2, BRIP1, CASR, CDC73, CDH1, CDK4, CDKN1B, CDKN1C, CDKN2A (p14ARF), CDKN2A (p16INK4a), CEBPA, CHEK2, CTNNA1, DICER1, DIS3L2, EGFR (c.2369C>T, p.Thr790Met variant only), EPCAM (Deletion/duplication testing only), FH, FLCN, GATA2, GPC3, GREM1 (Promoter region deletion/duplication testing only), HOXB13 (c.251G>A, p.Gly84Glu), HRAS, KIT, MAX, MEN1, MET, MITF (c.952G>A, p.Glu318Lys variant only), MLH1, MSH2, MSH3, MSH6, MUTYH, NBN, NF1, NF2, NTHL1, PALB2, PDGFRA, PHOX2B, PMS2, POLD1, POLE, POT1, PRKAR1A, PTCH1, PTEN, RAD50, RAD51C, RAD51D, RB1, RECQL4, RET, RUNX1, SDHAF2, SDHA (sequence changes only), SDHB, SDHC, SDHD, SMAD4, SMARCA4, SMARCB1, SMARCE1, STK11, SUFU, TERC, TERT, TMEM127, TP53, TSC1, TSC2, VHL, WRN and WT1.   Results: No pathogenic variants identified.  A Variant of Uncertain significance in BAP1 was identified c.1066C>T (p.Arg356Trp).  The date of this test report is 01/25/2018.    03/09/2018 Surgery   Right lumpectomy: ILC grade 2, 2 foci, 2 cm, 2.5 cm, superior margin positive, 0/5 lymph nodes negative, ER 60%, PR 90%, Ki-67 1%, HER-2 negative T2N0 stage Ia   03/09/2018 Oncotype testing   Recurrence score 17 Distant risk of recurrence at 9 years with AI  or Tamoxifen  alone is 5% There is a <1% benefit of chemotherapy   03/31/2018 Pathology Results   Reexcision: No residual cancer   05/09/2018 - 07/08/2018 Radiation Therapy   Radiation with Dr. Sondra Come 05/09/18- 07/08/18    06/21/2018 Cancer Staging   Staging form: Breast, AJCC 8th Edition - Pathologic: Stage IA (pT2, pN0, cM0, G2, ER+, PR+, HER2-) - Signed by Nicholas Lose, MD on 06/21/2018   07/2018 -  Anti-estrogen oral therapy   Anastrozole 67m daily starting 07/2018    Survivorship   Per LCira Rue NP      INTERVAL HISTORY:  Ms. BBoltzpresents to review her survivorship care plan detailing her treatment course for breast cancer, as well as monitoring long-term side effects of that treatment, education regarding health maintenance, screening, and overall wellness and health promotion.     Overall, Ms. BModisettereports feeling well for the most part. She completed adjuvant radiation 6 months ago. She continues to recover. She still has slight skin discoloration and thickening as well as breast edema which is stable. She saw breast surgeon yesterday for intermittent right and left breast pain and pain with lifting her arms. He said her exam was normal. He referred her to rehab/PT which she will start on 6/24. She continues anastrozole which she began in 07/2018. She is tolerating well with hot flashes which have become less frequent and less intense. She does have fatigue which can be severe at times. She has mild arthralgia and myalgia in her wrist and behind left knee. She notes intermittent left ankle swelling which began before breast cancer diagnosis in response to a drug allergy to statins. She also has intermittent dry cough since this reaction.     REVIEW OF SYSTEMS:  Review of Systems  Constitutional: Positive for fatigue. Negative for chills and fever.  Respiratory: Positive for cough. Negative for shortness of breath.   Cardiovascular: Positive for leg swelling. Negative for chest pain.        Intermittent left ankle swelling, pre-existing   Gastrointestinal: Negative.   Genitourinary: Negative.    Musculoskeletal: Positive for arthralgias and myalgias.       Wrists, behind left knee    Breast: Positive for right and left breast pain. Positive for right breast darkening, skin thickening. Denies any new nodularity, masses, nipple changes, or nipple discharge.      ONCOLOGY TREATMENT TEAM:  1. Surgeon:  [facility] 2. Medical Oncologist: Dr. FBurr Medico 3. Radiation Oncologist: Dr. KSondra Come    PAST MEDICAL/SURGICAL HISTORY:  Past Medical History:  Diagnosis Date  . Acid reflux   . Allergic rhinitis   . Arthritis   . Asthma   . Breast lump   . Bruises easily   [place]   . Chicken pox   . Colon polyp   . Family history of breast cancer   . Family history of pancreatic cancer   . Family history of prostate cancer   . Gum disease   . Hypertension   . Hypothyroidism   . Incontinence   . Measles   . Myopathy    arms and legs, statin drug related  . Night sweats   . Nipple discharge   . Pre-diabetes   . Prediabetes 06/2014   Past Surgical History:  Procedure Laterality Date  . ABDOMINAL HYSTERECTOMY  1986   secondary to fibroids  . BREAST LUMPECTOMY WITH RADIOACTIVE SEED AND SENTINEL LYMPH NODE BIOPSY Right 03/09/2018   Procedure:  RIGHT BREAST LUMPECTOMY WITH BRACKETED RADIOACTIVE SEED AND SENTINEL LYMPH NODE BIOPSY;  Surgeon: Rolm Bookbinder, MD;  Location: [facility];  Service: General;  Laterality: Right;  . BREAST SURGERY  07/09/2008   mass removal  . CHOLECYSTECTOMY  03/17/11  . RE-EXCISION OF BREAST LUMPECTOMY Right 03/31/2018   Procedure: RE-EXCISION OF BREAST LUMPECTOMY;  Surgeon: Rolm Bookbinder, MD;  Location: [facility];  Service: General;  Laterality: Right;  . SKIN TAG REMOVAL     brow and lid  . THIGH / KNEE SOFT TISSUE BIOPSY  09/05/2009  . TUBAL LIGATION  1980      ALLERGIES:  Allergies  Allergen Reactions  . Statins Other (See Comments)    Medication Crestor:   Urine became very dark within two days.   Nerve damage and myopathy as a result.   . Adhesive [Tape]   . Hydrogen Peroxide Other (See Comments)    Blisters   . Lidocaine Other (See Comments)    Combination of Lidocaine and Epinephrine during dental procedure caused her to "nearly arrest".   . Metrogel [Metronidazole] Other (See Comments)    Increase in vaginal burning.  Does better with Flagyl  . Latex Rash  . Neomycin-Bacitracin Zn-Polymyx Rash  . Sulfa Antibiotics Rash  . Sulfonamide Derivatives Rash     CURRENT MEDICATIONS:  Outpatient Encounter Medications as of 01/18/2019  Medication Sig  . Albuterol Sulfate (PROAIR RESPICLICK) 116 (90 Base) MCG/ACT AEPB Inhale 2 puffs into the lungs every 4 (four) hours as needed.  Marland Kitchen anastrozole (ARIMIDEX) 1 MG tablet TAKE 1 TABLET BY MOUTH  DAILY  . CALCIUM PO Take 1 tablet by mouth daily. Reported on 01/06/2016  . cetirizine (ZYRTEC) 10 MG tablet Take 10 mg by mouth daily.    . fluticasone furoate-vilanterol (BREO ELLIPTA) 200-25 MCG/INH AEPB Inhale 1 puff into the lungs daily.  . irbesartan (AVAPRO) 300 MG tablet Take 300 mg by mouth daily.   Marland Kitchen levothyroxine (SYNTHROID, LEVOTHROID) 112 MCG tablet Take 1 tablet by mouth daily.  . Multiple Vitamins-Minerals (MULTIVITAMINS THER. W/MINERALS) TABS Take 1 tablet by mouth daily.  Marland Kitchen nystatin ointment (MYCOSTATIN) Apply 1 application topically 2 (two) times daily. Apply to affected area for up to 7 days.  Marland Kitchen omeprazole (PRILOSEC) 40 MG capsule Take 1 capsule by mouth daily.  Marland Kitchen triamcinolone (KENALOG) 0.025 % ointment Apply 1 application topically 2 (two) times daily. Apply to affected area for 7 days.  . Vitamin D, Ergocalciferol, (DRISDOL) 50000 units CAPS capsule Take 1 capsule by mouth once a week.   No facility-administered encounter medications on file as of 01/18/2019.      ONCOLOGIC  FAMILY HISTORY:  Family History  Problem Relation Age of Onset  . Hypertension Mother   . Pancreatic cancer Mother 44  . Hypertension Father   . Prostate cancer Father 15  . Emphysema Father   . Hypertension Sister   . Breast cancer Sister        dx >50, caught very early  . Hypertension Brother   . Alzheimer's disease Brother   . Hypertension Sister   . Heart failure Brother   . Hypertension Brother   . Breast cancer Daughter 38       BRCA1/2 negative, never had panel testing  . Allergies Sister   . Allergies Brother   . Throat cancer Maternal Uncle      GENETIC COUNSELING/TESTING: 01/25/2018 report: Variant of Uncertain Significance identified in BAP1.   SOCIAL HISTORY:  Social History  Socioeconomic History  . Marital status: Single    Spouse name: Not on file  . Number of children: Not on file  . Years of education: Not on file  . Highest education level: Not on file  Occupational History  . Not on file  Social Needs  . Financial resource strain: Not on file  . Food insecurity    Worry: Not on file    Inability: Not on file  . Transportation needs    Medical: Not on file    Non-medical: Not on file  Tobacco Use  . Smoking status: Former Smoker    Packs/day: 1.00    Years: 48.00    Pack years: 48.00    Types: Cigarettes    Quit date: 08/03/2008    Years since quitting: 10.4  . Smokeless tobacco: Never Used  Substance and Sexual Activity  . Alcohol use: Yes    Alcohol/week: 0.0 standard drinks    Comment: rare  . Drug use: No  . Sexual activity: Not Currently  Lifestyle  . Physical activity    Days per week: Not on file    Minutes per session: Not on file  . Stress: Not on file  Relationships  . Social Herbalist on phone: Not on file    Gets together: Not on file    Attends religious service: Not on file    Active member of club or organization: Not on file    Attends meetings of clubs or organizations: Not on file    Relationship  status: Not on file  . Intimate partner violence    Fear of current or ex partner: Not on file    Emotionally abused: Not on file    Physically abused: Not on file    Forced sexual activity: Not on file  Other Topics Concern  . Not on file  Social History Narrative   Single, lives with daughter   2 children   Retired - Glass blower/designer   No recent travel      Financial controller Pulmonary:   Previously worked [employer]. From [place] and has always lived in [place]. No international travel. No pets currently. No bird or mold exposure.      OBSERVATIONS/OBJECTIVE:  Ms. Beitzel appears well over the phone. Speech is normal and non-pressured. Mood appears positive   LABORATORY DATA:  None for this visit.  DIAGNOSTIC IMAGING:  None for this visit.      ASSESSMENT AND PLAN:  Ms.. Lisa Horton is a pleasant 73 y.o. female with Stage I right breast invasive lobular carcinoma, ER+/PR+/HER2-, diagnosed in 01/2018, treated with lumpectomy, adjuvant radiation therapy, and anti-estrogen therapy with anastrozole beginning in 07/2018.  She presents to the Survivorship Clinic for our initial meeting and routine follow-up post-completion of treatment for breast cancer.    1. Stage I right breast cancer:  Ms. Woodle is continuing to recover from definitive treatment for breast cancer. She will follow-up with her medical oncologist, Dr. Burr Medico in 04/2019 with history and physical exam per surveillance protocol.  She will continue her anti-estrogen therapy with Anastrozole. Thus far, she is tolerating it moderately well with mild hot flashes, fatigue, and arthralgia/myalgias. She was instructed to make Dr. Burr Medico or myself aware if she begins to experience any worsening side effects of the medication and I could see her back in clinic to help manage those side effects, as needed. Her mammogram is due now; orders placed today.  Her breast density is category C. Due  to her lobular histology and dense breast tissue, she may  benefit from screening MRI in the future. Today, a comprehensive survivorship care plan and treatment summary was reviewed with the patient today detailing her breast cancer diagnosis, treatment course, potential late/long-term effects of treatment, appropriate follow-up care with recommendations for the future, and patient education resources.  A copy of this summary, along with a letter will be sent to the patient's primary care provider via mail/fax/In Basket message after today's visit.    2. Breast lymphedema: secondary to radiation. She will start PT on 01/25/19   3. Bone health:  Given Ms. Littrell's age/history of breast cancer and her current treatment regimen including anti-estrogen therapy with anastrozole, she is at risk for bone demineralization.  Her last DEXA scan was 01/2017, which showed T score -2.2 at the right femur neck, she is osteopenic. She is due for repeat DEXA now, she will call [facility] to schedule with her mammogram. In the meantime, she was encouraged to continue calcium and vitamin D as well as increase her weight-bearing activities.  She was given education on specific activities to promote bone health.  4. Cancer screening:  Due to Ms. Pfund's history and her age, she should receive screening for skin cancers, colon cancer, and gynecologic cancers.  She will f/u with her GYN soon. Her last colonoscopy was in 2018, she is on a 7 year recall. I do not have that report in Epic. The information and recommendations are listed on the patient's comprehensive care plan/treatment summary and were reviewed in detail with the patient.    5. Health maintenance and wellness promotion: Ms. Snowberger was encouraged to consume 5-7 servings of fruits and vegetables per day. We reviewed the "Nutrition Rainbow" handout, as well as the handout "Take Control of Your Health and Reduce Your Cancer Risk" from the [employer].  She was also encouraged to engage in moderate to vigorous exercise  for 30 minutes per day most days of the week. We discussed the [employer] fitness program, which is designed for cancer survivors to help them become more physically fit after cancer treatments.  She was instructed to limit her alcohol consumption and continue to abstain from tobacco use.     6. Support services/counseling: It is not uncommon for this period of the patient's cancer care trajectory to be one of many emotions and stressors.  We discussed how this can be increasingly difficult during the times of quarantine and social distancing due to the COVID-19 pandemic.   She was given information regarding our available services and encouraged to contact me with any questions or for help enrolling in any of our support group/programs. She is interested in "look good feel good" once support sessions resume.  Follow up instructions:    -Return to cancer center 04/2019 -Mammogram due in 01/2019 -DEXA due in 01/2019 -Patient will call [facility] to schedule  -Follow up with surgery as scheduled  -She is welcome to return back to the Survivorship Clinic at any time; no additional follow-up needed at this time.  -Consider referral back to survivorship as a long-term survivor for continued surveillance The patient was provided an opportunity to ask questions and all were answered. The patient agreed with the plan and demonstrated an understanding of the instructions.  The patient was advised to call back or seek an in-person evaluation if the symptoms worsen or if the condition fails to improve as anticipated.   I provided 30 minutes of non-face-to-face time during  this encounter.   Alla Feeling, NP

## 2019-01-18 ENCOUNTER — Inpatient Hospital Stay: Payer: Medicare Other | Attending: Nurse Practitioner | Admitting: Nurse Practitioner

## 2019-01-18 ENCOUNTER — Encounter: Payer: Self-pay | Admitting: Nurse Practitioner

## 2019-01-18 DIAGNOSIS — Z170 Estrogen receptor positive status [ER+]: Secondary | ICD-10-CM

## 2019-01-18 DIAGNOSIS — Z923 Personal history of irradiation: Secondary | ICD-10-CM | POA: Diagnosis not present

## 2019-01-18 DIAGNOSIS — Z79811 Long term (current) use of aromatase inhibitors: Secondary | ICD-10-CM

## 2019-01-18 DIAGNOSIS — C50411 Malignant neoplasm of upper-outer quadrant of right female breast: Secondary | ICD-10-CM

## 2019-01-19 ENCOUNTER — Other Ambulatory Visit: Payer: Self-pay | Admitting: General Surgery

## 2019-01-19 ENCOUNTER — Telehealth: Payer: Self-pay | Admitting: Nurse Practitioner

## 2019-01-19 DIAGNOSIS — N644 Mastodynia: Secondary | ICD-10-CM

## 2019-01-19 NOTE — Telephone Encounter (Signed)
No los per 6/17. °

## 2019-01-25 ENCOUNTER — Other Ambulatory Visit: Payer: Self-pay

## 2019-01-25 ENCOUNTER — Encounter: Payer: Self-pay | Admitting: Physical Therapy

## 2019-01-25 ENCOUNTER — Ambulatory Visit: Payer: Medicare Other | Attending: General Surgery | Admitting: Physical Therapy

## 2019-01-25 DIAGNOSIS — R293 Abnormal posture: Secondary | ICD-10-CM | POA: Diagnosis not present

## 2019-01-25 DIAGNOSIS — M6281 Muscle weakness (generalized): Secondary | ICD-10-CM

## 2019-01-25 DIAGNOSIS — M25611 Stiffness of right shoulder, not elsewhere classified: Secondary | ICD-10-CM | POA: Insufficient documentation

## 2019-01-25 DIAGNOSIS — L599 Disorder of the skin and subcutaneous tissue related to radiation, unspecified: Secondary | ICD-10-CM | POA: Diagnosis not present

## 2019-01-25 DIAGNOSIS — I890 Lymphedema, not elsewhere classified: Secondary | ICD-10-CM | POA: Insufficient documentation

## 2019-01-25 NOTE — Therapy (Signed)
[place], [place], [address] Phone: [phone]   Fax:  [phone]  Physical Therapy Evaluation  Patient Details  Name: Lisa Horton MRN: [id number] Date of Birth: 1946/02/29 Referring Provider (PT): Dr. Donne Hazel   Encounter Date: 01/25/2019  PT End of Session - 01/25/19 1618    Visit Number  1    Number of Visits  9    Date for PT Re-Evaluation  02/24/19    PT Start Time  8032    PT Stop Time  1619    PT Time Calculation (min)  49 min    Activity Tolerance  Patient tolerated treatment well    Behavior During Therapy  [facility] for tasks assessed/performed       Past Medical History:  Diagnosis Date  . Acid reflux   . Allergic rhinitis   . Arthritis   . Asthma   . Breast lump   . Bruises easily   . Cancer ([place])   . Chicken pox   . Colon polyp   . Family history of breast cancer   . Family history of pancreatic cancer   . Family history of prostate cancer   . Gum disease   . Hypertension   . Hypothyroidism   . Incontinence   . Measles   . Myopathy    arms and legs, statin drug related  . Night sweats   . Nipple discharge   . Pre-diabetes   . Prediabetes 06/2014    Past Surgical History:  Procedure Laterality Date  . ABDOMINAL HYSTERECTOMY  1986   secondary to fibroids  . BREAST LUMPECTOMY WITH RADIOACTIVE SEED AND SENTINEL LYMPH NODE BIOPSY Right 03/09/2018   Procedure: RIGHT BREAST LUMPECTOMY WITH BRACKETED RADIOACTIVE SEED AND SENTINEL LYMPH NODE BIOPSY;  Surgeon: Rolm Bookbinder, MD;  Location: [place];  Service: General;  Laterality: Right;  . BREAST SURGERY  07/09/2008   mass removal  . CHOLECYSTECTOMY  03/17/11  . RE-EXCISION OF BREAST LUMPECTOMY Right 03/31/2018   Procedure: RE-EXCISION OF BREAST LUMPECTOMY;  Surgeon: Rolm Bookbinder, MD;  Location: [place];  Service: General;  Laterality: Right;  . SKIN TAG REMOVAL     brow and lid   . THIGH / KNEE SOFT TISSUE BIOPSY  09/05/2009  . TUBAL LIGATION  1980    There were no vitals filed for this visit.   Subjective Assessment - 01/25/19 1543    Subjective  Pt is here because her right breast is swollen and also swelling ar the area where they took the lymph nodes out.  She is also having touble lifting both arsms    Pertinent History  Patient was diagnosed on 12/28/17 with right grade I invasive lobular carcinoma breast cancer. There are 2 areas in the upper outer quadrant that measure 1.7 cm and 1.8 cm. They are ER/PR positive and HER2 negative with a Ki67 of 1%. She has diabetes and hypertension. R lumpectomy 03/09/18, re excision 03/31/18, had radition that had to be delayed for a week because her skin opened up. She completed radiation 07/08/2018.She has and infection in right breast after radiation  Around the same time she had a biopsy on left breast but is was not cancer. Since completion of radiaion she has had changes in the right breast    Currently in Pain?  No/denies   when she stretches her arm   Pain Score  8     Pain Location  Breast  Pain Orientation  Right    Pain Descriptors / Indicators  Sharp;Shooting    Pain Type  Acute pain    Pain Onset  More than a month ago    Pain Frequency  Intermittent    Aggravating Factors   when she reaches out with her right arm    Pain Relieving Factors  stop moving         [facility] PT Assessment - 01/25/19 0001      Assessment   Medical Diagnosis  Right breast cancer    Referring Provider (PT)  Dr. Donne Hazel    Onset Date/Surgical Date  03/09/18    Hand Dominance  Right    Prior Therapy  breast clinic and previous PT in Dec.       Precautions   Precautions  Other (comment)    Precaution Comments  at risk for lymphedema      Restrictions   Weight Bearing Restrictions  No      Balance Screen   Has the patient fallen in the past 6 months  No    Has the patient had a decrease in activity level because of a fear of  falling?   No    Is the patient reluctant to leave their home because of a fear of falling?   No      Home Environment   Living Environment  Private residence    Living Arrangements  Children   Adult daughter Sharl Ma   Available Help at Discharge  Family      Prior Function   Level of Independence  Independent    Vocation  Retired    Leisure  has not been exercising       Cognition   Overall Cognitive Status  Within Functional Limits for tasks assessed      Observation/Other Assessments   Observations  pt has fullness in right breast with darker color, shininess and increased pore size . well healed incision with fullness above the scar. Visible congestion in  right axilla     Quick DASH   38.64      Posture/Postural Control   Posture/Postural Control  Postural limitations    Postural Limitations  Rounded Shoulders;Forward head;Increased thoracic kyphosis    Posture Comments  obesity       ROM / Strength   AROM / PROM / Strength  AROM;Strength      AROM   Right Shoulder Extension  --    Right Shoulder Flexion  135 Degrees    Right Shoulder ABduction  90 Degrees    Right Shoulder Internal Rotation  --    Right Shoulder External Rotation  25 Degrees    Left Shoulder Extension  --    Left Shoulder Flexion  140 Degrees    Left Shoulder ABduction  100 Degrees    Left Shoulder Internal Rotation  --    Left Shoulder External Rotation  70 Degrees    Cervical Flexion  --    Cervical Extension  --    Cervical - Right Side Bend  --    Cervical - Left Side Bend  --    Cervical - Right Rotation  --    Cervical - Left Rotation  --      Strength   Overall Strength  Deficits    Overall Strength Comments  appears to be generally deconditioned.  Pt is having diffuculty with general mobility       Palpation   Palpation comment  Pt has  obvious fullness and firmness in right breast and axilla and very painful trigger points to palpation around right shoulder blade                  LYMPHEDEMA/ONCOLOGY QUESTIONNAIRE - 01/25/19 1603      Type   Cancer Type  Right breast cancer      Surgeries   Lumpectomy Date  03/09/18    Other Surgery Date  03/31/18   re excision   Number Lymph Nodes Removed  2      Date Lymphedema/Swelling Started   Date  03/09/18      Treatment   Active Chemotherapy Treatment  No    Past Chemotherapy Treatment  No    Active Radiation Treatment  No    Past Radiation Treatment  No      What other symptoms do you have   Are you Having Heaviness or Tightness  Yes    Are you having Pain  Yes    Are you having pitting edema  Yes    Is it Hard or Difficult finding clothes that fit  No    Do you have infections  Yes    Is there Decreased scar mobility  Yes      Right Upper Extremity Lymphedema   10 cm Proximal to Olecranon Process  36 cm    Olecranon Process  27.5 cm    10 cm Proximal to Ulnar Styloid Process  25.3 cm    Just Proximal to Ulnar Styloid Process  17.5 cm    Across Hand at [employer]  20 cm    At [place] of 2nd Digit  6.2 cm      Left Upper Extremity Lymphedema   10 cm Proximal to Olecranon Process  37 cm    Olecranon Process  30 cm    10 cm Proximal to Ulnar Styloid Process  25.5 cm    Just Proximal to Ulnar Styloid Process  17 cm    Across Hand at [employer]  19.5 cm    At [place] of 2nd Digit  6.1 cm          Quick Dash - 01/25/19 0001    Do heavy household chores (wash walls, wash floors)  Moderate difficulty    Carry a shopping bag or briefcase  Severe difficulty    Wash your back  Moderate difficulty    Use a knife to cut food  Moderate difficulty    Recreational activities in which you take some force or impact through your arm, shoulder, or hand (golf, hammering, tennis)  Mild difficulty    During the past week, to what extent has your arm, shoulder or hand problem interfered with your normal social activities with family, friends, neighbors, or groups?  Modererately    During the past week, to  what extent has your arm, shoulder or hand problem limited your work or other regular daily activities  Slightly    Arm, shoulder, or hand pain.  Moderate    Tingling (pins and needles) in your arm, shoulder, or hand  Mild    Difficulty Sleeping  Moderate difficulty    DASH Score  38.64 %        Outpatient Re[facility]  Lymphedema Life Impact Scale Total Score  26.47 %      Objective measurements completed on examination: See above findings.      [place] Adult PT Treatment/Exercise - 01/25/19 0001  Exercises   Exercises  Shoulder      Shoulder Exercises: Supine   Other Supine Exercises  dowel rod flexion                   PT Long Term Goals - 01/25/19 1614      PT LONG TERM GOAL #1   Title  Pt will report a 50% improvement in right breast swelling    Time  4    Period  Weeks    Status  New      PT LONG TERM GOAL #2   Title  Pt will demonstrate 150 degrees of right shoulder flexion to allow her to reach overhead    Baseline  135 on 01/25/2019    Time  4    Period  Weeks    Status  New      PT LONG TERM GOAL #3   Title  Pt will report decreased pain in right breast when reaching to 4/10    Baseline  8/10 on eval    Time  4    Period  Weeks    Status  New      PT LONG TERM GOAL #4   Title  Pt will be independent in self breast MLD and use of compression for long term management of swelling    Time  4    Period  Weeks      PT LONG TERM GOAL #5   Title  Pt will be independent in a home exercise program for continued strengthening and stretching    Time  4    Period  Weeks    Status  New             Plan - 01/25/19 1619    Clinical Impression Statement  Pt presents to PT with persistent right breast lymphedema with decreased shoulder range of motion bilaterally.  She has very tender trigger points around right scapulare area axilla appears congested.  She is wearing a Prarid Vida  compression bra that appears to need to be replaced .  A new prescription request was sent today    Personal Factors and Comorbidities  Comorbidity 3+;Fitness;Behavior Pattern    Comorbidities  previous breast infection, pt is not doing the exercis and self MLD as previously taught , diabetes ,    Stability/Clinical Decision Making  Stable/Uncomplicated    Clinical Decision Making  Low    Clinical Presentation due to:  no further breast cancer treatment planned    Rehab Potential  Good    PT Frequency  2x / week    PT Duration  4 weeks    PT Treatment/Interventions  ADLs/Self Care Home Management;Patient/family education;Therapeutic activities;Therapeutic exercise;Scar mobilization;Compression bandaging;Manual lymph drainage;Manual techniques;Passive range of motion;Taping;Vasopneumatic Device;Orthotic Fit/Training    PT Next Visit Plan  begin MLD to right breast with soft tissue work to tight periscapular muscles, progress ROM see if pt got script for compression bra returned from Dr. Cristal Generous office    PT Home Exercise Plan  supine cane    Consulted and Agree with Plan of Care  Patient       Patient will benefit from skilled therapeutic intervention in order to improve the following deficits and impairments:  Decreased range of motion, Decreased strength, Increased edema, Postural dysfunction, Decreased scar mobility, Impaired perceived functional ability, Pain, Impaired UE functional use, Increased muscle spasms, Obesity  Visit Diagnosis: 1. Lymphedema, not elsewhere classified   2. Stiffness of right shoulder joint  3. Disorder of the skin and subcutaneous tissue related to radiation, unspecified   4. Abnormal posture   5. Muscle weakness (generalized)        Problem List Patient Active Problem List   Diagnosis Date Noted  . Genetic testing 02/04/2018  . Family history of pancreatic cancer   . Family history of prostate cancer   . Family history of breast cancer   .  Malignant neoplasm of upper-outer quadrant of right breast in female, estrogen receptor positive ([place]) 01/07/2018  . Moderate persistent asthma without complication 28/36/6294  . Seasonal and perennial allergic rhinitis 04/26/2017  . Allergy 01/28/2017  . Allergic rhinitis 11/28/2015  . Cough 11/28/2015  . Breast pain 08/25/2012  . Left breast mass 10/29/2011  . Symptomatic cholecystitis 02/24/2011  . HYPOTHYROIDISM 10/02/2010  . HYPERTENSION 10/02/2010  . G E R D 10/02/2010  . OSTEOARTHRITIS 10/02/2010   Donato Heinz. Owens Shark PT  Norwood Levo 01/25/2019, 4:28 PM  [place], [place], [address] Phone: [phone]   Fax:  [phone]  Name: Lisa Horton MRN: [id number] Date of Birth: 1946/02/05

## 2019-01-25 NOTE — Patient Instructions (Signed)

## 2019-01-26 DIAGNOSIS — E039 Hypothyroidism, unspecified: Secondary | ICD-10-CM | POA: Diagnosis not present

## 2019-01-26 DIAGNOSIS — C50919 Malignant neoplasm of unspecified site of unspecified female breast: Secondary | ICD-10-CM | POA: Diagnosis not present

## 2019-01-26 DIAGNOSIS — E119 Type 2 diabetes mellitus without complications: Secondary | ICD-10-CM | POA: Diagnosis not present

## 2019-01-26 DIAGNOSIS — G729 Myopathy, unspecified: Secondary | ICD-10-CM | POA: Diagnosis not present

## 2019-01-26 DIAGNOSIS — J309 Allergic rhinitis, unspecified: Secondary | ICD-10-CM | POA: Diagnosis not present

## 2019-02-01 ENCOUNTER — Ambulatory Visit: Payer: Medicare Other | Attending: General Surgery

## 2019-02-01 ENCOUNTER — Other Ambulatory Visit: Payer: Self-pay

## 2019-02-01 DIAGNOSIS — I890 Lymphedema, not elsewhere classified: Secondary | ICD-10-CM | POA: Diagnosis not present

## 2019-02-01 DIAGNOSIS — M6281 Muscle weakness (generalized): Secondary | ICD-10-CM

## 2019-02-01 DIAGNOSIS — Z170 Estrogen receptor positive status [ER+]: Secondary | ICD-10-CM | POA: Diagnosis not present

## 2019-02-01 DIAGNOSIS — R293 Abnormal posture: Secondary | ICD-10-CM | POA: Diagnosis not present

## 2019-02-01 DIAGNOSIS — L599 Disorder of the skin and subcutaneous tissue related to radiation, unspecified: Secondary | ICD-10-CM | POA: Diagnosis not present

## 2019-02-01 DIAGNOSIS — M25611 Stiffness of right shoulder, not elsewhere classified: Secondary | ICD-10-CM | POA: Insufficient documentation

## 2019-02-01 DIAGNOSIS — C50411 Malignant neoplasm of upper-outer quadrant of right female breast: Secondary | ICD-10-CM | POA: Insufficient documentation

## 2019-02-01 NOTE — Therapy (Signed)
[place], [place], [address] Phone: [phone]   Fax:  [phone]  Physical Therapy Treatment  Patient Details  Name: Lisa Horton MRN: [id number] Date of Birth: [birth year] Referring Provider (PT): Dr. Donne Hazel   Encounter Date: 02/01/2019  PT End of Session - 02/01/19 1420    Visit Number  2    Number of Visits  9    Date for PT Re-Evaluation  02/24/19    PT Start Time  1332    PT Stop Time  1420    PT Time Calculation (min)  48 min    Activity Tolerance  Patient tolerated treatment well    Behavior During Therapy  [facility] for tasks assessed/performed       Past Medical History:  Diagnosis Date  . Acid reflux   . Allergic rhinitis   . Arthritis   . Asthma   . Breast lump   . Bruises easily   . Cancer ([place])   . Chicken pox   . Colon polyp   . Family history of breast cancer   . Family history of pancreatic cancer   . Family history of prostate cancer   . Gum disease   . Hypertension   . Hypothyroidism   . Incontinence   . Measles   . Myopathy    arms and legs, statin drug related  . Night sweats   . Nipple discharge   . Pre-diabetes   . Prediabetes 06/2014    Past Surgical History:  Procedure Laterality Date  . ABDOMINAL HYSTERECTOMY  1986   secondary to fibroids  . BREAST LUMPECTOMY WITH RADIOACTIVE SEED AND SENTINEL LYMPH NODE BIOPSY Right 03/09/2018   Procedure: RIGHT BREAST LUMPECTOMY WITH BRACKETED RADIOACTIVE SEED AND SENTINEL LYMPH NODE BIOPSY;  Surgeon: Rolm Bookbinder, MD;  Location: [facility];  Service: General;  Laterality: Right;  . BREAST SURGERY  07/09/2008   mass removal  . CHOLECYSTECTOMY  03/17/11  . RE-EXCISION OF BREAST LUMPECTOMY Right 03/31/2018   Procedure: RE-EXCISION OF BREAST LUMPECTOMY;  Surgeon: Rolm Bookbinder, MD;  Location: [facility];  Service: General;  Laterality: Right;  . SKIN TAG REMOVAL     brow and lid   . THIGH / KNEE SOFT TISSUE BIOPSY  09/05/2009  . TUBAL LIGATION  1980    There were no vitals filed for this visit.  Subjective Assessment - 02/01/19 1334    Subjective  Nothing new since evaluation last week.    Pertinent History  Patient was diagnosed on 12/28/17 with right grade I invasive lobular carcinoma breast cancer. There are 2 areas in the upper outer quadrant that measure 1.7 cm and 1.8 cm. They are ER/PR positive and HER2 negative with a Ki67 of 1%. She has diabetes and hypertension. R lumpectomy 03/09/18, re excision 03/31/18, had radition that had to be delayed for a week because her skin opened up. She completed radiation 07/08/2018.She has and infection in right breast after radiation  Around the same time she had a biopsy on left breast but is was not cancer. Since completion of radiaion she has had changes in the right breast    Currently in Pain?  Yes    Pain Score  7     Pain Location  Breast    Pain Orientation  Right;Medial    Pain Descriptors / Indicators  Stabbing    Pain Type  Chronic pain    Pain Onset  More than a month  ago    Pain Frequency  Intermittent    Aggravating Factors   bumping it    Pain Relieving Factors  no pressure                  Outpatient Re[facility] from [facility]17/[facility]  Lymphedema Life Impact Scale Total Score  26.47 %           [facility] Adult PT Treatment/Exercise - 02/01/19 0001      Manual Therapy   Manual Therapy  Manual Lymphatic Drainage (MLD);Scapular mobilization;Soft tissue mobilization    Soft tissue mobilization  To posterior axillary area where tightness palpable with pt in Lt S/L, this area much softened by end of session    Scapular Mobilization  In Lt S/L for Rt protraction and retraction    Manual Lymphatic Drainage (MLD)  In Supine: Short neck, superficial and deep abdominals, Lt axilla and Rt inguinal nodes, anterior inter-axillary and Rt axillo-inguinal anastomosis, then  focused on Rt breast, then into Lt S/L for posterior inter-axillary and further work along Rt axillo-inguinal anastomosis                  PT Long Term Goals - 01/25/19 1614      PT LONG TERM GOAL #1   Title  Pt will report a 50% improvement in right breast swelling    Time  4    Period  Weeks    Status  New      PT LONG TERM GOAL #2   Title  Pt will demonstrate 150 degrees of right shoulder flexion to allow her to reach overhead    Baseline  135 on 01/25/2019    Time  4    Period  Weeks    Status  New      PT LONG TERM GOAL #3   Title  Pt will report decreased pain in right breast when reaching to 4/10    Baseline  8/10 on eval    Time  4    Period  Weeks    Status  New      PT LONG TERM GOAL #4   Title  Pt will be independent in self breast MLD and use of compression for long term management of swelling    Time  4    Period  Weeks      PT LONG TERM GOAL #5   Title  Pt will be independent in a home exercise program for continued strengthening and stretching    Time  4    Period  Weeks    Status  New            Plan - 02/01/19 1331    Clinical Impression Statement  First session of manual lymph drainage today and soft tissue to posterior axillary area. Pt with mod tenderness at posterior axilla but this was improved by end of session and softer to palpation. Issued copy of signed script to pt for compression bra.    Personal Factors and Comorbidities  Comorbidity 3+;Fitness;Behavior Pattern    Comorbidities  previous breast infection, pt is not doing the exercis and self MLD as previously taught , diabetes ,    Stability/Clinical Decision Making  Stable/Uncomplicated    Rehab Potential  Good    PT Frequency  2x / week    PT Duration  4 weeks    PT Treatment/Interventions  ADLs/Self Care Home Management;Patient/family education;Therapeutic activities;Therapeutic exercise;Scar mobilization;Compression bandaging;Manual lymph drainage;Manual  techniques;Passive  range of motion;Taping;Vasopneumatic Device;Orthotic Fit/Training    PT Next Visit Plan  Cont MLD to right breast with soft tissue work to tight periscapular muscles and posterior axilla, progress ROM;  see if pt got appt for new compression bra    PT Home Exercise Plan  supine cane    Consulted and Agree with Plan of Care  Patient       Patient will benefit from skilled therapeutic intervention in order to improve the following deficits and impairments:  Decreased range of motion, Decreased strength, Increased edema, Postural dysfunction, Decreased scar mobility, Impaired perceived functional ability, Pain, Impaired UE functional use, Increased muscle spasms, Obesity  Visit Diagnosis: 1. Lymphedema, not elsewhere classified   2. Stiffness of right shoulder joint   3. Disorder of the skin and subcutaneous tissue related to radiation, unspecified   4. Abnormal posture   5. Muscle weakness (generalized)        Problem List Patient Active Problem List   Diagnosis Date Noted  . Genetic testing 02/04/2018  . Family history of pancreatic cancer   . Family history of prostate cancer   . Family history of breast cancer   . Malignant neoplasm of upper-outer quadrant of right breast in female, estrogen receptor positive ([place]) 01/07/2018  . Moderate persistent asthma without complication 37/29/0211  . Seasonal and perennial allergic rhinitis 04/26/2017  . Allergy 01/28/2017  . Allergic rhinitis 11/28/2015  . Cough 11/28/2015  . Breast pain 08/25/2012  . Left breast mass 10/29/2011  . Symptomatic cholecystitis 02/24/2011  . HYPOTHYROIDISM 10/02/2010  . HYPERTENSION 10/02/2010  . G E R D 10/02/2010  . OSTEOARTHRITIS 10/02/2010    Otelia Limes, PTA 02/01/2019, 2:25 PM  [place], [place], [address] Phone: [phone]   Fax:  [phone]  Name: Lisa Horton MRN:  [id number] Date of Birth: 29-Apr-1946

## 2019-02-02 ENCOUNTER — Encounter: Payer: Self-pay | Admitting: Obstetrics & Gynecology

## 2019-02-02 DIAGNOSIS — N6452 Nipple discharge: Secondary | ICD-10-CM | POA: Diagnosis not present

## 2019-02-02 DIAGNOSIS — R29890 Loss of height: Secondary | ICD-10-CM | POA: Diagnosis not present

## 2019-02-02 DIAGNOSIS — M8589 Other specified disorders of bone density and structure, multiple sites: Secondary | ICD-10-CM | POA: Diagnosis not present

## 2019-02-02 DIAGNOSIS — Z853 Personal history of malignant neoplasm of breast: Secondary | ICD-10-CM | POA: Diagnosis not present

## 2019-02-06 ENCOUNTER — Other Ambulatory Visit: Payer: Self-pay

## 2019-02-06 ENCOUNTER — Encounter: Payer: Self-pay | Admitting: Rehabilitation

## 2019-02-06 ENCOUNTER — Ambulatory Visit: Payer: Medicare Other | Admitting: Rehabilitation

## 2019-02-06 DIAGNOSIS — Z170 Estrogen receptor positive status [ER+]: Secondary | ICD-10-CM | POA: Diagnosis not present

## 2019-02-06 DIAGNOSIS — I890 Lymphedema, not elsewhere classified: Secondary | ICD-10-CM

## 2019-02-06 DIAGNOSIS — R293 Abnormal posture: Secondary | ICD-10-CM

## 2019-02-06 DIAGNOSIS — L599 Disorder of the skin and subcutaneous tissue related to radiation, unspecified: Secondary | ICD-10-CM | POA: Diagnosis not present

## 2019-02-06 DIAGNOSIS — M25611 Stiffness of right shoulder, not elsewhere classified: Secondary | ICD-10-CM | POA: Diagnosis not present

## 2019-02-06 DIAGNOSIS — C50411 Malignant neoplasm of upper-outer quadrant of right female breast: Secondary | ICD-10-CM | POA: Diagnosis not present

## 2019-02-06 DIAGNOSIS — M6281 Muscle weakness (generalized): Secondary | ICD-10-CM

## 2019-02-06 NOTE — Therapy (Signed)
[place], [place], [address] Phone: [phone]   Fax:  [phone]  Physical Therapy Treatment  Patient Details  Name: Lisa Horton MRN: [id number] Date of Birth: [birth year] Referring Provider (PT): Dr. Donne Hazel   Encounter Date: 02/06/2019  PT End of Session - 02/06/19 1641    Visit Number  3    Number of Visits  9    Date for PT Re-Evaluation  02/24/19    PT Start Time  1555    PT Stop Time  1640    PT Time Calculation (min)  45 min    Activity Tolerance  Patient tolerated treatment well    Behavior During Therapy  [facility] for tasks assessed/performed       Past Medical History:  Diagnosis Date  . Acid reflux   . Allergic rhinitis   . Arthritis   . Asthma   . Breast lump   . Bruises easily   . Cancer ([place])   . Chicken pox   . Colon polyp   . Family history of breast cancer   . Family history of pancreatic cancer   . Family history of prostate cancer   . Gum disease   . Hypertension   . Hypothyroidism   . Incontinence   . Measles   . Myopathy    arms and legs, statin drug related  . Night sweats   . Nipple discharge   . Pre-diabetes   . Prediabetes 06/2014    Past Surgical History:  Procedure Laterality Date  . ABDOMINAL HYSTERECTOMY  1986   secondary to fibroids  . BREAST LUMPECTOMY WITH RADIOACTIVE SEED AND SENTINEL LYMPH NODE BIOPSY Right 03/09/2018   Procedure: RIGHT BREAST LUMPECTOMY WITH BRACKETED RADIOACTIVE SEED AND SENTINEL LYMPH NODE BIOPSY;  Surgeon: Rolm Bookbinder, MD;  Location: [place];  Service: General;  Laterality: Right;  . BREAST SURGERY  07/09/2008   mass removal  . CHOLECYSTECTOMY  03/17/11  . RE-EXCISION OF BREAST LUMPECTOMY Right 03/31/2018   Procedure: RE-EXCISION OF BREAST LUMPECTOMY;  Surgeon: Rolm Bookbinder, MD;  Location: [place];  Service: General;  Laterality: Right;  . SKIN TAG REMOVAL     brow and lid   . THIGH / KNEE SOFT TISSUE BIOPSY  09/05/2009  . TUBAL LIGATION  1980    There were no vitals filed for this visit.  Subjective Assessment - 02/06/19 1553    Subjective  The breast is having a few pains.  it was sore for a little but then better.  I have an appointment now for bras    Pertinent History  Patient was diagnosed on 12/28/17 with right grade I invasive lobular carcinoma breast cancer. There are 2 areas in the upper outer quadrant that measure 1.7 cm and 1.8 cm. They are ER/PR positive and HER2 negative with a Ki67 of 1%. She has diabetes and hypertension. R lumpectomy 03/09/18, re excision 03/31/18, had radition that had to be delayed for a week because her skin opened up. She completed radiation 07/08/2018.She has and infection in right breast after radiation  Around the same time she had a biopsy on left breast but is was not cancer. Since completion of radiaion she has had changes in the right breast    Currently in Pain?  No/denies   it doesn't hurt all the time                 Outpatient Rehab from 05/17/2018 [facility]  Lymphedema Life Impact Scale Total Score  26.47 %           [facility] Adult PT Treatment/Exercise - 02/06/19 0001      Shoulder Exercises: Pulleys   Flexion  2 minutes    ABduction  2 minutes      Manual Therapy   Soft tissue mobilization  in sidelying to the UT, rhomboids, latissimus and scapular muscles     Manual Lymphatic Drainage (MLD)  In Supine: Short neck, superficial and deep abdominals, Lt axilla and Rt inguinal nodes, anterior inter-axillary and Rt axillo-inguinal anastomosis, then focused on Rt breast, then into Lt S/L for posterior inter-axillary and further work along Rt axillo-inguinal anastomosis                  PT Long Term Goals - 02/06/19 1646      PT LONG TERM GOAL #1   Title  Pt will report a 50% improvement in right breast swelling            Plan - 02/06/19 1641     Clinical Impression Statement  Continued MLD to the Rt breast with pt permission and STM to the UT and scapular area with +2 ttp here.  Started some AAROM/exercises for bilateral shoulder ROM today tolerated well.  Fibrosis inferior and medial breast and more scar tissue laterally.    PT Treatment/Interventions  ADLs/Self Care Home Management;Patient/family education;Therapeutic activities;Therapeutic exercise;Scar mobilization;Compression bandaging;Manual lymph drainage;Manual techniques;Passive range of motion;Taping;Vasopneumatic Device;Orthotic Fit/Training    PT Next Visit Plan  Cont MLD to right breast with soft tissue work to tight periscapular muscles and posterior axilla, progress ROM;       Patient will benefit from skilled therapeutic intervention in order to improve the following deficits and impairments:     Visit Diagnosis: 1. Lymphedema, not elsewhere classified   2. Stiffness of right shoulder joint   3. Disorder of the skin and subcutaneous tissue related to radiation, unspecified   4. Abnormal posture   5. Muscle weakness (generalized)   6. Malignant neoplasm of upper-outer quadrant of right breast in female, estrogen receptor positive [facility])        Problem List Patient Active Problem List   Diagnosis Date Noted  . Genetic testing 02/04/2018  . Family history of pancreatic cancer   . Family history of prostate cancer   . Family history of breast cancer   . Malignant neoplasm of upper-outer quadrant of right breast in female, estrogen receptor positive ([place]) 01/07/2018  . Moderate persistent asthma without complication 07/26/8249  . Seasonal and perennial allergic rhinitis 04/26/2017  . Allergy 01/28/2017  . Allergic rhinitis 11/28/2015  . Cough 11/28/2015  . Breast pain 08/25/2012  . Left breast mass 10/29/2011  . Symptomatic cholecystitis 02/24/2011  . HYPOTHYROIDISM 10/02/2010  . HYPERTENSION 10/02/2010  . G E R D 10/02/2010  . OSTEOARTHRITIS 10/02/2010     Stark Bray 02/06/2019, 4:47 PM  [place], [place], [address] Phone: [phone]   Fax:  [phone]  Name: Lisa Horton MRN: [id number] Date of Birth: 1946/07/11

## 2019-02-08 ENCOUNTER — Other Ambulatory Visit: Payer: Self-pay

## 2019-02-08 ENCOUNTER — Ambulatory Visit: Payer: Medicare Other | Admitting: Rehabilitation

## 2019-02-08 ENCOUNTER — Encounter: Payer: Self-pay | Admitting: Rehabilitation

## 2019-02-08 DIAGNOSIS — M6281 Muscle weakness (generalized): Secondary | ICD-10-CM | POA: Diagnosis not present

## 2019-02-08 DIAGNOSIS — L599 Disorder of the skin and subcutaneous tissue related to radiation, unspecified: Secondary | ICD-10-CM | POA: Diagnosis not present

## 2019-02-08 DIAGNOSIS — I890 Lymphedema, not elsewhere classified: Secondary | ICD-10-CM | POA: Diagnosis not present

## 2019-02-08 DIAGNOSIS — R293 Abnormal posture: Secondary | ICD-10-CM | POA: Diagnosis not present

## 2019-02-08 DIAGNOSIS — M25611 Stiffness of right shoulder, not elsewhere classified: Secondary | ICD-10-CM

## 2019-02-08 DIAGNOSIS — Z170 Estrogen receptor positive status [ER+]: Secondary | ICD-10-CM | POA: Diagnosis not present

## 2019-02-08 DIAGNOSIS — C50411 Malignant neoplasm of upper-outer quadrant of right female breast: Secondary | ICD-10-CM | POA: Diagnosis not present

## 2019-02-08 NOTE — Therapy (Signed)
[place], [place], [address] Phone: [phone]   Fax:  [phone]  Physical Therapy Treatment  Patient Details  Name: Lisa Horton MRN: [id number] Date of Birth: 08-06-1945 Referring Provider (PT): Dr. Donne Hazel   Encounter Date: 02/08/2019  PT End of Session - 02/08/19 1459    Visit Number  4    Number of Visits  9    Date for PT Re-Evaluation  02/24/19    PT Start Time  1121    PT Stop Time  1545    PT Time Calculation (min)  47 min    Activity Tolerance  Patient tolerated treatment well    Behavior During Therapy  [facility] for tasks assessed/performed       Past Medical History:  Diagnosis Date  . Acid reflux   . Allergic rhinitis   . Arthritis   . Asthma   . Breast lump   . Bruises easily   . Cancer ([place])   . Chicken pox   . Colon polyp   . Family history of breast cancer   . Family history of pancreatic cancer   . Family history of prostate cancer   . Gum disease   . Hypertension   . Hypothyroidism   . Incontinence   . Measles   . Myopathy    arms and legs, statin drug related  . Night sweats   . Nipple discharge   . Pre-diabetes   . Prediabetes 06/2014    Past Surgical History:  Procedure Laterality Date  . ABDOMINAL HYSTERECTOMY  1986   secondary to fibroids  . BREAST LUMPECTOMY WITH RADIOACTIVE SEED AND SENTINEL LYMPH NODE BIOPSY Right 03/09/2018   Procedure: RIGHT BREAST LUMPECTOMY WITH BRACKETED RADIOACTIVE SEED AND SENTINEL LYMPH NODE BIOPSY;  Surgeon: Rolm Bookbinder, MD;  Location: [place];  Service: General;  Laterality: Right;  . BREAST SURGERY  07/09/2008   mass removal  . CHOLECYSTECTOMY  03/17/11  . RE-EXCISION OF BREAST LUMPECTOMY Right 03/31/2018   Procedure: RE-EXCISION OF BREAST LUMPECTOMY;  Surgeon: Rolm Bookbinder, MD;  Location: [place];  Service: General;  Laterality: Right;  . SKIN TAG REMOVAL     brow and lid   . THIGH / KNEE SOFT TISSUE BIOPSY  09/05/2009  . TUBAL LIGATION  1980    There were no vitals filed for this visit.  Subjective Assessment - 02/08/19 1456    Subjective  I am ok today.    Pertinent History  Patient was diagnosed on 12/28/17 with right grade I invasive lobular carcinoma breast cancer. There are 2 areas in the upper outer quadrant that measure 1.7 cm and 1.8 cm. They are ER/PR positive and HER2 negative with a Ki67 of 1%. She has diabetes and hypertension. R lumpectomy 03/09/18, re excision 03/31/18, had radition that had to be delayed for a week because her skin opened up. She completed radiation 07/08/2018.She has and infection in right breast after radiation  Around the same time she had a biopsy on left breast but is was not cancer. Since completion of radiaion she has had changes in the right breast    Currently in Pain?  No/denies                  Outpatient Rehab from [facility]/17/2018 [facility]  Lymphedema Life Impact Scale Total Score  26.47 %           [facility] Adult PT Treatment/Exercise -  02/08/19 0001      Shoulder Exercises: Pulleys   Flexion  2 minutes    ABduction  2 minutes      Shoulder Exercises: Therapy Ball   Flexion  Both;10 reps      Manual Therapy   Manual Therapy  Passive ROM    Soft tissue mobilization  in sidelying to the UT, rhomboids, latissimus and scapular muscles     Manual Lymphatic Drainage (MLD)  In Supine: Short neck, superficial and deep abdominals, Lt axilla and Rt inguinal nodes, anterior inter-axillary and Rt axillo-inguinal anastomosis, then focused on Rt breast, then repeating all steps.  Some work inthe Rt axilla stationary circles and STM to soften axillary region    Passive ROM  of the Rt shoulder                   PT Long Term Goals - 02/06/19 1646      PT LONG TERM GOAL #1   Title  Pt will report a 50% improvement in right breast swelling            Plan -  02/08/19 1548    Clinical Impression Statement  continued MLD to the Rt breast with pt permission.  Focus on axillary work/softening the scar tissue and edema in this area as well as some scar tissue mobilization.  Also included more Rt shoulder PROM with pt feeling pull all the way into the abdomen.  Pt notes some edema fibrosis moving across the chest into the opposite breast which is present along with a lumpy area.  Pt reports the MD has been watching this and they have done [place] and biopsy.    PT Frequency  2x / week    PT Duration  4 weeks    PT Treatment/Interventions  ADLs/Self Care Home Management;Patient/family education;Therapeutic activities;Therapeutic exercise;Scar mobilization;Compression bandaging;Manual lymph drainage;Manual techniques;Passive range of motion;Taping;Vasopneumatic Device;Orthotic Fit/Training    PT Next Visit Plan  Cont MLD to right breast with soft tissue work to tight periscapular muscles and posterior axilla, progress ROM;    PT Home Exercise Plan  supine cane    Consulted and Agree with Plan of Care  Patient       Patient will benefit from skilled therapeutic intervention in order to improve the following deficits and impairments:     Visit Diagnosis: 1. Lymphedema, not elsewhere classified   2. Stiffness of right shoulder joint   3. Disorder of the skin and subcutaneous tissue related to radiation, unspecified   4. Abnormal posture        Problem List Patient Active Problem List   Diagnosis Date Noted  . Genetic testing 02/04/2018  . Family history of pancreatic cancer   . Family history of prostate cancer   . Family history of breast cancer   . Malignant neoplasm of upper-outer quadrant of right breast in female, estrogen receptor positive ([place]) 01/07/2018  . Moderate persistent asthma without complication 15/12/6977  . Seasonal and perennial allergic rhinitis 04/26/2017  . Allergy 01/28/2017  . Allergic rhinitis 11/28/2015  . Cough 11/28/2015   . Breast pain 08/25/2012  . Left breast mass 10/29/2011  . Symptomatic cholecystitis 02/24/2011  . HYPOTHYROIDISM 10/02/2010  . HYPERTENSION 10/02/2010  . G E R D 10/02/2010  . OSTEOARTHRITIS 10/02/2010    Shan Levans, PT 02/08/2019, 3:51 PM  [place], [place], [address] Phone: [phone]   Fax:  [phone]  Name: Lisa Horton MRN:  [id number] Date of Birth: 01-Aug-1946

## 2019-02-09 ENCOUNTER — Encounter: Payer: Self-pay | Admitting: Radiation Oncology

## 2019-02-09 ENCOUNTER — Other Ambulatory Visit: Payer: Self-pay

## 2019-02-09 ENCOUNTER — Ambulatory Visit
Admission: RE | Admit: 2019-02-09 | Discharge: 2019-02-09 | Disposition: A | Discharge status: Home / Self Care | Payer: Medicare Other | Source: Ambulatory Visit | Attending: Radiation Oncology | Admitting: Radiation Oncology

## 2019-02-09 VITALS — BP 135/87 | HR 81 | Temp 98.2°F | Resp 16 | Wt 223.2 lb

## 2019-02-09 DIAGNOSIS — R609 Edema, unspecified: Secondary | ICD-10-CM | POA: Insufficient documentation

## 2019-02-09 DIAGNOSIS — Z08 Encounter for follow-up examination after completed treatment for malignant neoplasm: Secondary | ICD-10-CM | POA: Diagnosis not present

## 2019-02-09 DIAGNOSIS — Z923 Personal history of irradiation: Secondary | ICD-10-CM | POA: Diagnosis not present

## 2019-02-09 DIAGNOSIS — C50411 Malignant neoplasm of upper-outer quadrant of right female breast: Secondary | ICD-10-CM | POA: Diagnosis not present

## 2019-02-09 DIAGNOSIS — Z79899 Other long term (current) drug therapy: Secondary | ICD-10-CM | POA: Diagnosis not present

## 2019-02-09 DIAGNOSIS — N6459 Other signs and symptoms in breast: Secondary | ICD-10-CM | POA: Diagnosis not present

## 2019-02-09 DIAGNOSIS — Z170 Estrogen receptor positive status [ER+]: Secondary | ICD-10-CM | POA: Insufficient documentation

## 2019-02-09 DIAGNOSIS — Z79811 Long term (current) use of aromatase inhibitors: Secondary | ICD-10-CM | POA: Insufficient documentation

## 2019-02-09 NOTE — Progress Notes (Signed)
Patient states she is doing fair following radiation treatments.  She states she has some nipple discharge of clear sticky fluid and some tenderness and soreness underneath her arm.  She is seeing PT and wearing a compression bra.  Gloriajean Dell. Leonie Green, BSN

## 2019-02-09 NOTE — Progress Notes (Signed)
Radiation Oncology         [phone] ________________________________  Name: Lisa Horton MRN: [id number]  Date: 02/09/2019  DOB: November 07, 1945  Follow-Up Visit Note  CC: [name], DO  [name], MD    ICD-10-CM   1. Malignant neoplasm of upper-outer quadrant of right breast in female, estrogen receptor positive ([place])  C50.411    Z17.0     Diagnosis:   73 y.o. female with Malignant neoplasm of UOQ right breast, stage IA (pT2, pN0, cM0, G2, ER+, PR+, Her2-)   Interval Since Last Radiation:  7 months   Radiation treatment dates:   1. 05/09/18-05/27/18, 06/13/18-06/28/18 (patient was on an 18-day break in treatment after developing a seroma and breast infection) 2. 06/29/18-07/07/18  Site/dose:   1. Right breast / 50.4 Gy total in 28 fractions of 1.8 Gy 2. Boost / 2 Gy x 5 fractions for a total of 10 Gy  Narrative:  The patient returns today for routine follow-up.  Patient states that she is doing fair following radiation treatments.  She reports some clear, sticky nipple discharge from her right breast on one occasion as well as some tenderness and soreness underneath her right arm. She is seeing physical therapy and wearing a compression bra.                              ALLERGIES:  is allergic to statins; adhesive [tape]; hydrogen peroxide; lidocaine; metrogel [metronidazole]; latex; neomycin-bacitracin zn-polymyx; sulfa antibiotics; and sulfonamide derivatives.  Meds: Current Outpatient Medications  Medication Sig Dispense Refill  . Albuterol Sulfate (PROAIR RESPICLICK) 740 (90 Base) MCG/ACT AEPB Inhale 2 puffs into the lungs every 4 (four) hours as needed. 1 each 0  . anastrozole (ARIMIDEX) 1 MG tablet TAKE 1 TABLET BY MOUTH  DAILY 90 tablet 3  . CALCIUM PO Take 1 tablet by mouth daily. Reported on 01/06/2016    . cetirizine (ZYRTEC) 10 MG tablet Take 10 mg by mouth daily.      . fluticasone furoate-vilanterol (BREO ELLIPTA) 200-25 MCG/INH AEPB Inhale 1 puff into the  lungs daily. 30 each 5  . irbesartan (AVAPRO) 300 MG tablet Take 300 mg by mouth daily.     Marland Kitchen levothyroxine (SYNTHROID, LEVOTHROID) 112 MCG tablet Take 1 tablet by mouth daily.    . Multiple Vitamins-Minerals (MULTIVITAMINS THER. W/MINERALS) TABS Take 1 tablet by mouth daily.    Marland Kitchen nystatin ointment (MYCOSTATIN) Apply 1 application topically 2 (two) times daily. Apply to affected area for up to 7 days. 30 g 0  . omeprazole (PRILOSEC) 40 MG capsule Take 1 capsule by mouth daily.    . SYMBICORT 160-4.5 MCG/ACT inhaler Inhale 2 puffs into the lungs 2 (two) times daily.    Marland Kitchen triamcinolone (KENALOG) 0.025 % ointment Apply 1 application topically 2 (two) times daily. Apply to affected area for 7 days. 30 g 0  . Vitamin D, Ergocalciferol, (DRISDOL) 50000 units CAPS capsule Take 1 capsule by mouth once a week.  3   No current facility-administered medications for this encounter.     Physical Findings: The patient is in no acute distress. Patient is alert and oriented.  weight is 223 lb 3.2 oz (101.2 kg). Her oral temperature is 98.2 F (36.8 C). Her blood pressure is 135/87 and her pulse is 81. Her respiration is 16 and oxygen saturation is 99%.   Lungs are clear to auscultation bilaterally. Heart has regular rate and rhythm.  No palpable cervical, supraclavicular, or axillary adenopathy. Abdomen soft, non-tender, normal bowel sounds.  Breast Exam Left Breast: Large and pendulous without mass, nipple discharge or bleeding. Right Breast: Patient continues to have significant hyperpigmentation changes in the right breast. She continues to have a lot of edema in the breast, particularly in the lower-inner aspect. No nipple discharge or bleeding noted. No obvious mass in the breast but difficult to evaluate considering the edema. The patient did have an MRI of the breast earlier this year showing no dominant mass in the breast. She also underwent mammography last Friday at [facility], and per  patient, no evidence of tumor. We have requested a copy of these reports.   Lab Findings: Lab Results  Component Value Date   WBC 4.8 10/10/2018   HGB 12.1 10/10/2018   HCT 38.6 10/10/2018   MCV 94.1 10/10/2018   PLT 143 (L) 10/10/2018    Radiographic Findings: No results found.  Impression:  Malignant neoplasm of UOQ right breast, stage IA (pT2, pN0, cM0, G2, ER+, PR+, Her2-). No evidence of recurrence. Patient continues to have significant edema in the breast but is undergoing physical therapy 2-3 times per week to address this issue. She denies any pain in the breast at this time.  Plan:  Routine follow-up in 3 months. She continues to take Arimidex.   ____________________________________  Blair Promise, PhD, MD  This document serves as a record of services personally performed by Gery Pray, MD. It was created on his behalf by Rae Lips, a trained medical scribe. The creation of this record is based on the scribe's personal observations and the provider's statements to them. This document has been checked and approved by the attending provider.

## 2019-02-10 ENCOUNTER — Telehealth: Payer: Self-pay

## 2019-02-10 DIAGNOSIS — C50911 Malignant neoplasm of unspecified site of right female breast: Secondary | ICD-10-CM | POA: Diagnosis not present

## 2019-02-10 NOTE — Telephone Encounter (Signed)
Patient notified of BMD results of Stable repeat in 3-4 years Per Dr. [name] Heck.

## 2019-02-13 ENCOUNTER — Other Ambulatory Visit: Payer: Self-pay

## 2019-02-13 ENCOUNTER — Ambulatory Visit: Payer: Medicare Other | Admitting: Rehabilitation

## 2019-02-13 ENCOUNTER — Encounter: Payer: Self-pay | Admitting: Rehabilitation

## 2019-02-13 DIAGNOSIS — R293 Abnormal posture: Secondary | ICD-10-CM

## 2019-02-13 DIAGNOSIS — I890 Lymphedema, not elsewhere classified: Secondary | ICD-10-CM

## 2019-02-13 DIAGNOSIS — M6281 Muscle weakness (generalized): Secondary | ICD-10-CM

## 2019-02-13 DIAGNOSIS — M25611 Stiffness of right shoulder, not elsewhere classified: Secondary | ICD-10-CM | POA: Diagnosis not present

## 2019-02-13 DIAGNOSIS — C50411 Malignant neoplasm of upper-outer quadrant of right female breast: Secondary | ICD-10-CM | POA: Diagnosis not present

## 2019-02-13 DIAGNOSIS — L599 Disorder of the skin and subcutaneous tissue related to radiation, unspecified: Secondary | ICD-10-CM | POA: Diagnosis not present

## 2019-02-13 DIAGNOSIS — Z170 Estrogen receptor positive status [ER+]: Secondary | ICD-10-CM

## 2019-02-13 NOTE — Therapy (Signed)
[place], [place], [address] Phone: [phone]   Fax:  [phone]  Physical Therapy Treatment  Patient Details  Name: Lisa Horton MRN: [id number] Date of Birth: 11-11-1945 Referring Provider (PT): Dr. Donne Hazel   Encounter Date: 02/13/2019  PT End of Session - 02/13/19 1605    Visit Number  5    Number of Visits  9    Date for PT Re-Evaluation  02/24/19    PT Start Time  1600    PT Stop Time  1650    PT Time Calculation (min)  50 min    Activity Tolerance  Patient tolerated treatment well    Behavior During Therapy  [facility] for tasks assessed/performed       Past Medical History:  Diagnosis Date  . Acid reflux   . Allergic rhinitis   . Arthritis   . Asthma   . Breast lump   . Bruises easily   . Cancer ([place])   . Chicken pox   . Colon polyp   . Family history of breast cancer   . Family history of pancreatic cancer   . Family history of prostate cancer   . Gum disease   . Hypertension   . Hypothyroidism   . Incontinence   . Measles   . Myopathy    arms and legs, statin drug related  . Night sweats   . Nipple discharge   . Pre-diabetes   . Prediabetes 06/2014    Past Surgical History:  Procedure Laterality Date  . ABDOMINAL HYSTERECTOMY  1986   secondary to fibroids  . BREAST LUMPECTOMY WITH RADIOACTIVE SEED AND SENTINEL LYMPH NODE BIOPSY Right 03/09/2018   Procedure: RIGHT BREAST LUMPECTOMY WITH BRACKETED RADIOACTIVE SEED AND SENTINEL LYMPH NODE BIOPSY;  Surgeon: Rolm Bookbinder, MD;  Location: [place];  Service: General;  Laterality: Right;  . BREAST SURGERY  07/09/2008   mass removal  . CHOLECYSTECTOMY  03/17/11  . RE-EXCISION OF BREAST LUMPECTOMY Right 03/31/2018   Procedure: RE-EXCISION OF BREAST LUMPECTOMY;  Surgeon: Rolm Bookbinder, MD;  Location: [place];  Service: General;  Laterality: Right;  . SKIN TAG REMOVAL     brow and lid   . THIGH / KNEE SOFT TISSUE BIOPSY  09/05/2009  . TUBAL LIGATION  1980    There were no vitals filed for this visit.  Subjective Assessment - 02/13/19 1604    Subjective  My knee just hurts    Pertinent History  Patient was diagnosed on 12/28/17 with right grade I invasive lobular carcinoma breast cancer. There are 2 areas in the upper outer quadrant that measure 1.7 cm and 1.8 cm. They are ER/PR positive and HER2 negative with a Ki67 of 1%. She has diabetes and hypertension. R lumpectomy 03/09/18, re excision 03/31/18, had radition that had to be delayed for a week because her skin opened up. She completed radiation 07/08/2018.She has and infection in right breast after radiation  Around the same time she had a biopsy on left breast but is was not cancer. Since completion of radiaion she has had changes in the right breast    Currently in Pain?  No/denies   no breast pain                 Outpatient Rehab from 05/17/2018 in [place]  Lymphedema Life Impact Scale Total Score  26.47 %           [facility]  Adult PT Treatment/Exercise - 02/13/19 0001      Shoulder Exercises: Standing   Other Standing Exercises  scaption 1# wrist weight bil x 10     Other Standing Exercises  overhead raise alternating x 10       Shoulder Exercises: Pulleys   Flexion  2 minutes    ABduction  2 minutes      Shoulder Exercises: Therapy Ball   Flexion  Both;10 reps    Flexion Limitations  1# weight added to wrist      Manual Therapy   Manual therapy comments  made breast swell spot similar 1/2" black foam for use in compression bra. Instructed on at least 73mn before self MLD    Manual Lymphatic Drainage (MLD)  Focus on self MLD today with pt reporting that she had the paperwork for this but as we reviewed steps it became evident that she was not aware of MLD sequence and pt reports she did not have this instruction after all.  followed along with self handout today  with hand over hand instruction and sig vcs/tcs.  difficulty reaching the Rt lateral trunk so we discussed doing this in more seated or with long handled sponge.  then short session by PT focusing on Rt breast    Passive ROM  of the Rt shoulder with STM into the Rt axilla for softening and scar tissue. Some with arm in radiation like position             PT Education - 02/13/19 1701    Education Details  self MLD    Person(s) Educated  Patient    Methods  Explanation;Demonstration;Tactile cues;Verbal cues;Handout    Comprehension  Verbalized understanding;Returned demonstration;Verbal cues required;Tactile cues required;Need further instruction          PT Long Term Goals - 02/06/19 1646      PT LONG TERM GOAL #1   Title  Pt will report a 50% improvement in right breast swelling            Plan - 02/13/19 1702    Clinical Impression Statement  continued with bilateral Rt>Lt shoulder mobility, fashioned foam insert for new bra to try at home.  Pt had been reporting that she knew how to do self MLD but when reviewing it today she realized that she was unaware of the steps.  Gave pt new handout today.  Rt breast still firm and fibrotic medially/inferior and still tender to palpation globally.  Rt shouler ROM seems to be improving.    PT Treatment/Interventions  ADLs/Self Care Home Management;Patient/family education;Therapeutic activities;Therapeutic exercise;Scar mobilization;Compression bandaging;Manual lymph drainage;Manual techniques;Passive range of motion;Taping;Vasopneumatic Device;Orthotic Fit/Training    PT Next Visit Plan  focus on self MLD indepedence, how was foam?, then continue Rt shoulder ROM/axillary STM and Rt breast MLD for pain relief.    PT Home Exercise Plan  supine cane    Consulted and Agree with Plan of Care  Patient       Patient will benefit from skilled therapeutic intervention in order to improve the following deficits and impairments:     Visit  Diagnosis: 1. Lymphedema, not elsewhere classified   2. Stiffness of right shoulder joint   3. Disorder of the skin and subcutaneous tissue related to radiation, unspecified   4. Abnormal posture   5. Muscle weakness (generalized)   6. Malignant neoplasm of upper-outer quadrant of right breast in female, estrogen receptor positive [place]        Problem List  Patient Active Problem List   Diagnosis Date Noted  . Genetic testing 02/04/2018  . Family history of pancreatic cancer   . Family history of prostate cancer   . Family history of breast cancer   . Malignant neoplasm of upper-outer quadrant of right breast in female, estrogen receptor positive ([place]) 01/07/2018  . Moderate persistent asthma without complication 63/14/9702  . Seasonal and perennial allergic rhinitis 04/26/2017  . Allergy 01/28/2017  . Allergic rhinitis 11/28/2015  . Cough 11/28/2015  . Breast pain 08/25/2012  . Left breast mass 10/29/2011  . Symptomatic cholecystitis 02/24/2011  . HYPOTHYROIDISM 10/02/2010  . HYPERTENSION 10/02/2010  . G E R D 10/02/2010  . OSTEOARTHRITIS 10/02/2010    Stark Bray 02/13/2019, 5:05 PM  [place], [place], [address] Phone: [phone]   Fax:  [phone]  Name: LYNIX BONINE MRN: [id number] Date of Birth: Jul 10, 1946

## 2019-02-13 NOTE — Patient Instructions (Signed)

## 2019-02-15 ENCOUNTER — Other Ambulatory Visit: Payer: Self-pay

## 2019-02-15 ENCOUNTER — Encounter: Payer: Self-pay | Admitting: Physical Therapy

## 2019-02-15 ENCOUNTER — Ambulatory Visit: Payer: Medicare Other | Admitting: Physical Therapy

## 2019-02-15 DIAGNOSIS — C50411 Malignant neoplasm of upper-outer quadrant of right female breast: Secondary | ICD-10-CM | POA: Diagnosis not present

## 2019-02-15 DIAGNOSIS — L599 Disorder of the skin and subcutaneous tissue related to radiation, unspecified: Secondary | ICD-10-CM | POA: Diagnosis not present

## 2019-02-15 DIAGNOSIS — R293 Abnormal posture: Secondary | ICD-10-CM

## 2019-02-15 DIAGNOSIS — M6281 Muscle weakness (generalized): Secondary | ICD-10-CM | POA: Diagnosis not present

## 2019-02-15 DIAGNOSIS — I890 Lymphedema, not elsewhere classified: Secondary | ICD-10-CM | POA: Diagnosis not present

## 2019-02-15 DIAGNOSIS — M25611 Stiffness of right shoulder, not elsewhere classified: Secondary | ICD-10-CM | POA: Diagnosis not present

## 2019-02-15 DIAGNOSIS — Z170 Estrogen receptor positive status [ER+]: Secondary | ICD-10-CM | POA: Diagnosis not present

## 2019-02-15 NOTE — Therapy (Signed)
[place], [place], [address] Phone: [phone]   Fax:  [phone]  Physical Therapy Treatment  Patient Details  Name: Lisa Horton MRN: [id number] Date of Birth: 1946-03-14 Referring Provider (PT): Dr. Donne Hazel   Encounter Date: 02/15/2019  PT End of Session - 02/15/19 1624    Visit Number  6    Number of Visits  9    Date for PT Re-Evaluation  02/24/19    PT Start Time  3545    PT Stop Time  1620    PT Time Calculation (min)  50 min    Activity Tolerance  Patient tolerated treatment well    Behavior During Therapy  [facility] for tasks assessed/performed       Past Medical History:  Diagnosis Date  . Acid reflux   . Allergic rhinitis   . Arthritis   . Asthma   . Breast lump   . Bruises easily   . Cancer ([place])   . Chicken pox   . Colon polyp   . Family history of breast cancer   . Family history of pancreatic cancer   . Family history of prostate cancer   . Gum disease   . Hypertension   . Hypothyroidism   . Incontinence   . Measles   . Myopathy    arms and legs, statin drug related  . Night sweats   . Nipple discharge   . Pre-diabetes   . Prediabetes 06/2014    Past Surgical History:  Procedure Laterality Date  . ABDOMINAL HYSTERECTOMY  1986   secondary to fibroids  . BREAST LUMPECTOMY WITH RADIOACTIVE SEED AND SENTINEL LYMPH NODE BIOPSY Right 03/09/2018   Procedure: RIGHT BREAST LUMPECTOMY WITH BRACKETED RADIOACTIVE SEED AND SENTINEL LYMPH NODE BIOPSY;  Surgeon: Rolm Bookbinder, MD;  Location: [place];  Service: General;  Laterality: Right;  . BREAST SURGERY  07/09/2008   mass removal  . CHOLECYSTECTOMY  03/17/11  . RE-EXCISION OF BREAST LUMPECTOMY Right 03/31/2018   Procedure: RE-EXCISION OF BREAST LUMPECTOMY;  Surgeon: Rolm Bookbinder, MD;  Location: [place];  Service: General;  Laterality: Right;  . SKIN TAG REMOVAL     brow and lid   . THIGH / KNEE SOFT TISSUE BIOPSY  09/05/2009  . TUBAL LIGATION  1980    There were no vitals filed for this visit.  Subjective Assessment - 02/15/19 1540    Subjective  Pt states she still is having some swelling and pain in her right breast and armpit area    Currently in Pain?  No/denies                  Outpatient [facility]17/[facility]  Lymphedema Life Impact Scale Total Score  26.47 %           [facility] Adult PT Treatment/Exercise - 02/15/19 0001      Manual Therapy   Soft tissue mobilization  in sidelying to the UT, rhomboids, latissimus and scapular muscles  pt had multiple tender  triggerpoints in posterior shoulder and axilla and pec major muscle     Manual Lymphatic Drainage (MLD)  In Supine: Short neck, superficial and deep abdominals, Lt axilla and Rt inguinal nodes, anterior inter-axillary and Rt axillo-inguinal anastomosis, then focused on Rt breast, then into Lt S/L for posterior inter-axillary and further work along Rt axillo-inguinal anastomosis    Passive ROM  to right shoulder in flexion abduction and  diagonals.                   PT Long Term Goals - 02/06/19 1646      PT LONG TERM GOAL #1   Title  Pt will report a 50% improvement in right breast swelling            Plan - 02/15/19 1625    Clinical Impression Statement  pt responded well to soft tissue work to posterior shoulder and axilla, but still had trigger points and tenderness especially at pec major.  Compression bra provides compression to anterior breast, but does not compress at lateral chest and axilla.  Pt reports she had not pain at end of session today    Comorbidities  previous breast infection, pt is not doing the exercis and self MLD as previously taught , diabetes ,    Rehab Potential  Good    PT Frequency  2x / week    PT Duration  4 weeks    PT Treatment/Interventions  ADLs/Self Care Home Management;Patient/family  education;Therapeutic activities;Therapeutic exercise;Scar mobilization;Compression bandaging;Manual lymph drainage;Manual techniques;Passive range of motion;Taping;Vasopneumatic Device;Orthotic Fit/Training    PT Next Visit Plan  Assess goals Continue with soft tissue work to trigger point areas.  focus on self MLD indepedence, how was foam?, then continue Rt shoulder ROM/axillary STM and Rt breast MLD for pain relief.    Consulted and Agree with Plan of Care  Patient       Patient will benefit from skilled therapeutic intervention in order to improve the following deficits and impairments:     Visit Diagnosis: 1. Lymphedema, not elsewhere classified   2. Stiffness of right shoulder joint   3. Disorder of the skin and subcutaneous tissue related to radiation, unspecified   4. Abnormal posture   5. Muscle weakness (generalized)        Problem List Patient Active Problem List   Diagnosis Date Noted  . Genetic testing 02/04/2018  . Family history of pancreatic cancer   . Family history of prostate cancer   . Family history of breast cancer   . Malignant neoplasm of upper-outer quadrant of right breast in female, estrogen receptor positive ([place]) 01/07/2018  . Moderate persistent asthma without complication 18/98/4210  . Seasonal and perennial allergic rhinitis 04/26/2017  . Allergy 01/28/2017  . Allergic rhinitis 11/28/2015  . Cough 11/28/2015  . Breast pain 08/25/2012  . Left breast mass 10/29/2011  . Symptomatic cholecystitis 02/24/2011  . HYPOTHYROIDISM 10/02/2010  . HYPERTENSION 10/02/2010  . G E R D 10/02/2010  . OSTEOARTHRITIS 10/02/2010   Donato Heinz. Owens Shark PT  Norwood Levo 02/15/2019, 4:33 PM  [place], [place], [address] Phone: [phone]   Fax:  [phone]  Name: Lisa Horton MRN: [id number] Date of Birth: January 23, 1946

## 2019-02-20 ENCOUNTER — Ambulatory Visit: Payer: Medicare Other | Admitting: Physical Therapy

## 2019-02-20 ENCOUNTER — Other Ambulatory Visit: Payer: Self-pay

## 2019-02-20 ENCOUNTER — Encounter: Payer: Self-pay | Admitting: Physical Therapy

## 2019-02-20 DIAGNOSIS — I890 Lymphedema, not elsewhere classified: Secondary | ICD-10-CM | POA: Diagnosis not present

## 2019-02-20 DIAGNOSIS — R293 Abnormal posture: Secondary | ICD-10-CM

## 2019-02-20 DIAGNOSIS — Z170 Estrogen receptor positive status [ER+]: Secondary | ICD-10-CM | POA: Diagnosis not present

## 2019-02-20 DIAGNOSIS — L599 Disorder of the skin and subcutaneous tissue related to radiation, unspecified: Secondary | ICD-10-CM

## 2019-02-20 DIAGNOSIS — C50411 Malignant neoplasm of upper-outer quadrant of right female breast: Secondary | ICD-10-CM | POA: Diagnosis not present

## 2019-02-20 DIAGNOSIS — M25611 Stiffness of right shoulder, not elsewhere classified: Secondary | ICD-10-CM | POA: Diagnosis not present

## 2019-02-20 DIAGNOSIS — M6281 Muscle weakness (generalized): Secondary | ICD-10-CM | POA: Diagnosis not present

## 2019-02-20 NOTE — Therapy (Signed)
[place], [place], [address] Phone: [phone]   Fax:  [phone]  Physical Therapy Treatment  Patient Details  Name: Lisa Horton MRN: [id number] Date of Birth: 03-04-1946 Referring Provider (PT): Dr. Donne Hazel   Encounter Date: 02/20/2019  PT End of Session - 02/20/19 1643    Visit Number  7    Number of Visits  9    Date for PT Re-Evaluation  02/24/19    PT Start Time  2197    PT Stop Time  1615    PT Time Calculation (min)  45 min    Activity Tolerance  Patient tolerated treatment well    Behavior During Therapy  [facility] for tasks assessed/performed       Past Medical History:  Diagnosis Date  . Acid reflux   . Allergic rhinitis   . Arthritis   . Asthma   . Breast lump   . Bruises easily   . Cancer ([place])   . Chicken pox   . Colon polyp   . Family history of breast cancer   . Family history of pancreatic cancer   . Family history of prostate cancer   . Gum disease   . Hypertension   . Hypothyroidism   . Incontinence   . Measles   . Myopathy    arms and legs, statin drug related  . Night sweats   . Nipple discharge   . Pre-diabetes   . Prediabetes 06/2014    Past Surgical History:  Procedure Laterality Date  . ABDOMINAL HYSTERECTOMY  1986   secondary to fibroids  . BREAST LUMPECTOMY WITH RADIOACTIVE SEED AND SENTINEL LYMPH NODE BIOPSY Right 03/09/2018   Procedure: RIGHT BREAST LUMPECTOMY WITH BRACKETED RADIOACTIVE SEED AND SENTINEL LYMPH NODE BIOPSY;  Surgeon: Rolm Bookbinder, MD;  Location: [place];  Service: General;  Laterality: Right;  . BREAST SURGERY  07/09/2008   mass removal  . CHOLECYSTECTOMY  03/17/11  . RE-EXCISION OF BREAST LUMPECTOMY Right 03/31/2018   Procedure: RE-EXCISION OF BREAST LUMPECTOMY;  Surgeon: Rolm Bookbinder, MD;  Location: [place];  Service: General;  Laterality: Right;  . SKIN TAG REMOVAL     brow and lid   . THIGH / KNEE SOFT TISSUE BIOPSY  09/05/2009  . TUBAL LIGATION  1980    There were no vitals filed for this visit.  Subjective Assessment - 02/20/19 1531    Subjective  Pt states she was a little sore after last session that lasted aboaut 2 days. She still has some soreness in her breast when she presses on it.    Pertinent History  Patient was diagnosed on 12/28/17 with right grade I invasive lobular carcinoma breast cancer. There are 2 areas in the upper outer quadrant that measure 1.7 cm and 1.8 cm. They are ER/PR positive and HER2 negative with a Ki67 of 1%. She has diabetes and hypertension. R lumpectomy 03/09/18, re excision 03/31/18, had radition that had to be delayed for a week because her skin opened up. She completed radiation 07/08/2018.She has and infection in right breast after radiation  Around the same time she had a biopsy on left breast but is was not cancer. Since completion of radiaion she has had changes in the right breast    Currently in Pain?  No/denies                  Outpatient Rehab from 05/17/2018 in [place]  Lymphedema Life Impact Scale Total Score  26.47 %           [facility] Adult PT Treatment/Exercise - 02/20/19 0001      Shoulder Exercises: Seated   Other Seated Exercises  neck and upper thoracic ROM       Shoulder Exercises: Pulleys   Flexion  2 minutes    ABduction  2 minutes      Shoulder Exercises: Therapy Ball   Flexion  Both;10 reps      Shoulder Exercises: ROM/Strengthening   Ball on Wall  yellow ball up the wall with extra stretch at the top. Pt moves slowly and carefully due to pain       Manual Therapy   Manual Therapy  Edema management;Joint mobilization;Manual Lymphatic Drainage (MLD)    Manual therapy comments  rght breast visbly full with darkened skin     Edema Management  dicussed the Lymphapress intermttent compressin pump with the jacket with the extra axially component. Pt agreed to  send demographics     Joint Mobilization  [place] , AC and GH joint mobilization and long axis traction of right arm     Manual Lymphatic Drainage (MLD)  In Supine: Short neck, superficial and deep abdominals, Lt axilla and Rt inguinal nodes, anterior inter-axillary and Rt axillo-inguinal anastomosis, then focused on Rt breast, then into Lt S/L for posterior inter-axillary and further work along Rt axillo-inguinal anastomosis  Extra time spent on posterior anastamosis.    Passive ROM  to right shoulder in flexion abduction and diagonals.  Pt was able to raise arm overhead resting on pillow with slight elbow flexion, but she had pulling along pec major tendon with fullness palpated in axilla. Pt very tender in this area.                    PT Long Term Goals - 02/20/19 1533      PT LONG TERM GOAL #1   Title  Pt will report a 50% improvement in right breast swelling    Baseline  Pt states she still has the swelling and is not sure what percent to put on it.    Time  4    Period  Weeks    Status  On-going      PT LONG TERM GOAL #2   Title  Pt will demonstrate 150 degrees of right shoulder flexion to allow her to reach overhead    Baseline  135 on 01/25/2019,  130 degrees on 7/20 with pain    Time  4    Period  Weeks    Status  On-going      PT LONG TERM GOAL #3   Title  Pt will report decreased pain in right breast when reaching to 4/10    Baseline  8/10 on eval, 5/10 on 7/20/202    Time  4    Period  Weeks    Status  On-going      PT LONG TERM GOAL #4   Title  Pt will be independent in self breast MLD and use of compression for long term management of swelling    Period  Weeks    Status  On-going            Plan - 02/20/19 1644    Clinical Impression Statement  Goals reassessed Pt continues to have fullness in right breast and lateral trunk with very tight tender fullness in axilla with tight pec major and posterior shoudler muscles  despite MLD, manual techniques,  exercise and compression bra.   She would benefit for trial of lympa press pump with axillary component in trunk piecde to see if she can get some relief.    Personal Factors and Comorbidities  Comorbidity 3+;Fitness;Behavior Pattern    Comorbidities  previous breast infection, pt is not doing the exercis and self MLD as previously taught , diabetes ,    PT Frequency  2x / week    PT Duration  4 weeks    PT Treatment/Interventions  ADLs/Self Care Home Management;Patient/family education;Therapeutic activities;Therapeutic exercise;Scar mobilization;Compression bandaging;Manual lymph drainage;Manual techniques;Passive range of motion;Taping;Vasopneumatic Device;Orthotic Fit/Training    PT Next Visit Plan  Continue MLD and manual work   focus on self MLD indepedence, and use of compression foam. then continue Rt shoulder ROM/axillary STM and Rt breast MLD for pain relief.    PT Home Exercise Plan  supine cane    Consulted and Agree with Plan of Care  Patient       Patient will benefit from skilled therapeutic intervention in order to improve the following deficits and impairments:  Decreased range of motion, Decreased strength, Increased edema, Postural dysfunction, Decreased scar mobility, Impaired perceived functional ability, Pain, Impaired UE functional use, Increased muscle spasms, Obesity  Visit Diagnosis: 1. Lymphedema, not elsewhere classified   2. Stiffness of right shoulder joint   3. Abnormal posture   4. Disorder of the skin and subcutaneous tissue related to radiation, unspecified   5. Muscle weakness (generalized)        Problem List Patient Active Problem List   Diagnosis Date Noted  . Genetic testing 02/04/2018  . Family history of pancreatic cancer   . Family history of prostate cancer   . Family history of breast cancer   . Malignant neoplasm of upper-outer quadrant of right breast in female, estrogen receptor positive ([place]) 01/07/2018  . Moderate persistent asthma  without complication 60/60/0459  . Seasonal and perennial allergic rhinitis 04/26/2017  . Allergy 01/28/2017  . Allergic rhinitis 11/28/2015  . Cough 11/28/2015  . Breast pain 08/25/2012  . Left breast mass 10/29/2011  . Symptomatic cholecystitis 02/24/2011  . HYPOTHYROIDISM 10/02/2010  . HYPERTENSION 10/02/2010  . G E R D 10/02/2010  . OSTEOARTHRITIS 10/02/2010   Donato Heinz. Owens Shark PT  Norwood Levo 02/20/2019, 4:49 PM  [place], [place], [address] Phone: [phone]   Fax:  [phone]  Name: Lisa Horton MRN: [id number] Date of Birth: 1945/12/08

## 2019-02-22 ENCOUNTER — Other Ambulatory Visit: Payer: Self-pay

## 2019-02-22 ENCOUNTER — Ambulatory Visit: Payer: Medicare Other | Admitting: Physical Therapy

## 2019-02-22 DIAGNOSIS — L599 Disorder of the skin and subcutaneous tissue related to radiation, unspecified: Secondary | ICD-10-CM | POA: Diagnosis not present

## 2019-02-22 DIAGNOSIS — M25611 Stiffness of right shoulder, not elsewhere classified: Secondary | ICD-10-CM | POA: Diagnosis not present

## 2019-02-22 DIAGNOSIS — R293 Abnormal posture: Secondary | ICD-10-CM | POA: Diagnosis not present

## 2019-02-22 DIAGNOSIS — C50411 Malignant neoplasm of upper-outer quadrant of right female breast: Secondary | ICD-10-CM | POA: Diagnosis not present

## 2019-02-22 DIAGNOSIS — I890 Lymphedema, not elsewhere classified: Secondary | ICD-10-CM

## 2019-02-22 DIAGNOSIS — M6281 Muscle weakness (generalized): Secondary | ICD-10-CM

## 2019-02-22 DIAGNOSIS — Z170 Estrogen receptor positive status [ER+]: Secondary | ICD-10-CM | POA: Diagnosis not present

## 2019-02-22 NOTE — Therapy (Signed)
[place], [place], [address] Phone: [phone]   Fax:  [phone]  Physical Therapy Treatment  Patient Details  Name: Lisa Horton MRN: [id number] Date of Birth: 1946/07/25 Referring Provider (PT): Dr. Donne Hazel   Encounter Date: 02/22/2019  PT End of Session - 02/22/19 1539    Visit Number  9    Number of Visits  13    Date for PT Re-Evaluation  03/24/19    PT Start Time  1430    PT Stop Time  1515    PT Time Calculation (min)  45 min    Activity Tolerance  Patient tolerated treatment well    Behavior During Therapy  [facility] for tasks assessed/performed       Past Medical History:  Diagnosis Date  . Acid reflux   . Allergic rhinitis   . Arthritis   . Asthma   . Breast lump   . Bruises easily   . Cancer ([place])   . Chicken pox   . Colon polyp   . Family history of breast cancer   . Family history of pancreatic cancer   . Family history of prostate cancer   . Gum disease   . Hypertension   . Hypothyroidism   . Incontinence   . Measles   . Myopathy    arms and legs, statin drug related  . Night sweats   . Nipple discharge   . Pre-diabetes   . Prediabetes 06/2014    Past Surgical History:  Procedure Laterality Date  . ABDOMINAL HYSTERECTOMY  1986   secondary to fibroids  . BREAST LUMPECTOMY WITH RADIOACTIVE SEED AND SENTINEL LYMPH NODE BIOPSY Right 03/09/2018   Procedure: RIGHT BREAST LUMPECTOMY WITH BRACKETED RADIOACTIVE SEED AND SENTINEL LYMPH NODE BIOPSY;  Surgeon: Rolm Bookbinder, MD;  Location: [facility];  Service: General;  Laterality: Right;  . BREAST SURGERY  07/09/2008   mass removal  . CHOLECYSTECTOMY  03/17/11  . RE-EXCISION OF BREAST LUMPECTOMY Right 03/31/2018   Procedure: RE-EXCISION OF BREAST LUMPECTOMY;  Surgeon: Rolm Bookbinder, MD;  Location: [facility];  Service: General;  Laterality: Right;  . SKIN TAG REMOVAL     brow and lid   . THIGH / KNEE SOFT TISSUE BIOPSY  09/05/2009  . TUBAL LIGATION  1980    There were no vitals filed for this visit.  Subjective Assessment - 02/22/19 1534    Subjective  Pt states she is feeling a little better with less pain going down into breast    Pertinent History  Patient was diagnosed on 12/28/17 with right grade I invasive lobular carcinoma breast cancer. There are 2 areas in the upper outer quadrant that measure 1.7 cm and 1.8 cm. They are ER/PR positive and HER2 negative with a Ki67 of 1%. She has diabetes and hypertension. R lumpectomy 03/09/18, re excision 03/31/18, had radition that had to be delayed for a week because her skin opened up. She completed radiation 07/08/2018.She has and infection in right breast after radiation  Around the same time she had a biopsy on left breast but is was not cancer. Since completion of radiaion she has had changes in the right breast    Currently in Pain?  No/denies                  Outpatient [facility]  Lymphedema Life Impact Scale Total Score  26.47 %  [facility] Adult PT Treatment/Exercise - 02/22/19 0001      Shoulder Exercises: Supine   Protraction  AROM;Right;10 reps    Flexion  AROM;10 reps      Shoulder Exercises: Sidelying   External Rotation  AROM;Right;10 reps    Flexion  AROM;Right;10 reps    Other Sidelying Exercises  small circles with hand pointed to ceiling.       Manual Therapy   Manual Therapy  Edema management;Manual Lymphatic Drainage (MLD)    Manual Lymphatic Drainage (MLD)  In Supine: Short neck, superficial and deep abdominals, Lt axilla and Rt inguinal nodes, anterior inter-axillary and Rt axillo-inguinal anastomosis, then focused on Rt breast, then into Lt S/L for posterior inter-axillary and further work along Rt axillo-inguinal anastomosis  Extra time spent on posterior anastamosis.    Passive ROM  to right shoulder in flexion abduction  and diagonals.  Pt was able to raise arm overhead resting on pillow with slight elbow flexion, but she had pulling along pec major tendon with fullness palpated in axilla. Pt very tender in this area.                    PT Long Term Goals - 02/22/19 1519      PT LONG TERM GOAL #1   Title  Pt will report a 50% improvement in right breast swelling    Baseline  Pt states she still has the swelling and is not sure what percent to put on it., 02/22/2019 not quite 50%    Period  Weeks    Status  On-going      PT LONG TERM GOAL #2   Title  Pt will demonstrate 150 degrees of right shoulder flexion to allow her to reach overhead    Baseline  135 on 01/25/2019,  130 degrees on 7/20 with pain,      PT LONG TERM GOAL #3   Title  Pt will report decreased pain in right breast when reaching to 4/10    Baseline  8/10 on eval, 5/10 on 7/20/202,  4/10 on 02/22/2019    Status  Achieved      PT LONG TERM GOAL #4   Title  Pt will be independent in self breast MLD and use of compression for long term management of swelling    Baseline  pt reports she is still a little "heavy handed' with MLD.  She is awaing a trial of a compression pump    Period  Weeks    Status  On-going            Plan - 02/22/19 1539    Clinical Impression Statement  Pt breast appears to be softer today though she still has discoloration and enlarged pore size.  Her right shoulder continues to be stiff and painful though active movement was better after manual treatment and exercise today.  Began process for getting a compression pump and pt will get the demonstration next week to see if it will be beneficial for her.  Renewal sent fo one time a week for 4 weeks or until she decides about the pump and demonstrates that she can continue her self care at home.    Comorbidities  previous breast infection, pt is not doing the exercis and self MLD as previously taught , diabetes ,    PT Frequency  1x / week    PT Duration  4  weeks    PT Treatment/Interventions  ADLs/Self Care Home Management;Patient/family education;Therapeutic activities;Therapeutic  exercise;Scar mobilization;Compression bandaging;Manual lymph drainage;Manual techniques;Passive range of motion;Taping;Vasopneumatic Device;Orthotic Fit/Training    PT Next Visit Plan  follow up to see if she is getting the lymphapress pump Continue MLD and manual work   focus on self MLD indepedence, and use of compression foam. then continue Rt shoulder ROM/axillary STM and Rt breast MLD for pain relief.    Consulted and Agree with Plan of Care  Patient       Patient will benefit from skilled therapeutic intervention in order to improve the following deficits and impairments:  Decreased range of motion, Decreased strength, Increased edema, Postural dysfunction, Decreased scar mobility, Impaired perceived functional ability, Pain, Impaired UE functional use, Increased muscle spasms, Obesity  Visit Diagnosis: 1. Lymphedema, not elsewhere classified   2. Stiffness of right shoulder joint   3. Abnormal posture   4. Disorder of the skin and subcutaneous tissue related to radiation, unspecified   5. Muscle weakness (generalized)        Problem List Patient Active Problem List   Diagnosis Date Noted  . Genetic testing 02/04/2018  . Family history of pancreatic cancer   . Family history of prostate cancer   . Family history of breast cancer   . Malignant neoplasm of upper-outer quadrant of right breast in female, estrogen receptor positive ([place]) 01/07/2018  . Moderate persistent asthma without complication 44/10/4740  . Seasonal and perennial allergic rhinitis 04/26/2017  . Allergy 01/28/2017  . Allergic rhinitis 11/28/2015  . Cough 11/28/2015  . Breast pain 08/25/2012  . Left breast mass 10/29/2011  . Symptomatic cholecystitis 02/24/2011  . HYPOTHYROIDISM 10/02/2010  . HYPERTENSION 10/02/2010  . G E R D 10/02/2010  . OSTEOARTHRITIS 10/02/2010   Donato Heinz.  Owens Shark PT  Norwood Levo 02/22/2019, 3:43 PM  [place], [place], [address] Phone: [phone]   Fax:  [phone]  Name: Lisa Horton MRN: [id number] Date of Birth: 1946/07/07

## 2019-02-24 ENCOUNTER — Encounter: Payer: Self-pay | Admitting: *Deleted

## 2019-02-27 DIAGNOSIS — I890 Lymphedema, not elsewhere classified: Secondary | ICD-10-CM | POA: Diagnosis not present

## 2019-03-06 ENCOUNTER — Telehealth: Payer: Self-pay

## 2019-03-06 NOTE — Telephone Encounter (Signed)
Spoke with patient per Dr. Ernestina Penna recommendation stop the Anastrozole for now until she comes in for her follow up on 9/10 to see if this is what is causing her knee pain.  The patient verbalized an understanding.

## 2019-03-06 NOTE — Telephone Encounter (Signed)
Patient calls with complaint on onset of left knee pain x 3 weeks, has tried heat, icy hot rub and it doesn't help.  The patient feels like it is coming from the Anastrozole (she has been on it since January 2020).  She previously had some wrist pain which she attributed to Anastrozole.  She is seeking Dr. Ernestina Penna advice on what she should do.  She does not have an orthopedist she sees.

## 2019-03-14 ENCOUNTER — Ambulatory Visit: Payer: Medicare Other | Attending: General Surgery | Admitting: Physical Therapy

## 2019-03-14 ENCOUNTER — Other Ambulatory Visit: Payer: Self-pay

## 2019-03-14 ENCOUNTER — Encounter: Payer: Self-pay | Admitting: Physical Therapy

## 2019-03-14 DIAGNOSIS — M25611 Stiffness of right shoulder, not elsewhere classified: Secondary | ICD-10-CM

## 2019-03-14 DIAGNOSIS — R293 Abnormal posture: Secondary | ICD-10-CM | POA: Insufficient documentation

## 2019-03-14 DIAGNOSIS — L599 Disorder of the skin and subcutaneous tissue related to radiation, unspecified: Secondary | ICD-10-CM

## 2019-03-14 DIAGNOSIS — I890 Lymphedema, not elsewhere classified: Secondary | ICD-10-CM | POA: Diagnosis not present

## 2019-03-14 DIAGNOSIS — M6281 Muscle weakness (generalized): Secondary | ICD-10-CM

## 2019-03-14 NOTE — Therapy (Addendum)
[facility] [address] [place], [place], [address] Phone: [phone]   Fax:  [phone]  Physical Therapy Treatment Progress Note Reporting Period 01/25/2019 to 03/15/2019  See note below for Objective Data and Assessment of Progress/Goals.       Patient Details  Name: Lisa Horton MRN: [id number] Date of Birth: 09/19/1945 Referring Provider (PT): Dr. Wakefield   Encounter Date: 03/14/2019  PT End of Session - 03/14/19 1620    Visit Number  10    Number of Visits  13    Date for PT Re-Evaluation  03/24/19    PT Start Time  1530    PT Stop Time  1610    PT Time Calculation (min)  40 min    Activity Tolerance  Patient tolerated treatment well    Behavior During Therapy  WFL for tasks assessed/performed       Past Medical History:  Diagnosis Date  . Acid reflux   . Allergic rhinitis   . Arthritis   . Asthma   . Breast lump   . Bruises easily   . Cancer ([facility])   . Chicken pox   . Colon polyp   . Family history of breast cancer   . Family history of pancreatic cancer   . Family history of prostate cancer   . Gum disease   . Hypertension   . Hypothyroidism   . Incontinence   . Measles   . Myopathy    arms and legs, statin drug related  . Night sweats   . Nipple discharge   . Pre-diabetes   . Prediabetes 06/2014    Past Surgical History:  Procedure Laterality Date  . ABDOMINAL HYSTERECTOMY  1986   secondary to fibroids  . BREAST LUMPECTOMY WITH RADIOACTIVE SEED AND SENTINEL LYMPH NODE BIOPSY Right 03/09/2018   Procedure: RIGHT BREAST LUMPECTOMY WITH BRACKETED RADIOACTIVE SEED AND SENTINEL LYMPH NODE BIOPSY;  Surgeon: Wakefield, Matthew, MD;  Location: [facility];  Service: General;  Laterality: Right;  . BREAST SURGERY  07/09/2008   mass removal  . CHOLECYSTECTOMY  03/17/11  . RE-EXCISION OF BREAST LUMPECTOMY Right 03/31/2018   Procedure: RE-EXCISION OF BREAST LUMPECTOMY;  Surgeon:  Wakefield, Matthew, MD;  Location: [facility];  Service: General;  Laterality: Right;  . SKIN TAG REMOVAL     brow and lid  . THIGH / KNEE SOFT TISSUE BIOPSY  09/05/2009  . TUBAL LIGATION  1980    There were no vitals filed for this visit.  Subjective Assessment - 03/14/19 1529    Subjective  Pt says she can tell her breast is getting softer near the incision, but she still has pitting edem in medial lower quadrant of breast and lots of tightness in her axilla and chest wall She has been wearing her compression bra day and night.    Pertinent History  Patient was diagnosed on 12/28/17 with right grade I invasive lobular carcinoma breast cancer. There are 2 areas in the upper outer quadrant that measure 1.7 cm and 1.8 cm. They are ER/PR positive and HER2 negative with a Ki67 of 1%. She has diabetes and hypertension. R lumpectomy 03/09/18, re excision 03/31/18, had radition that had to be delayed for a week because her skin opened up. She completed radiation 07/08/2018.She has and infection in right breast after radiation  Around the same time she had a biopsy on left breast but is was not cancer. Since completion of radiaion she has had   changes in the right breast    Currently in Pain?  Yes    Pain Score  3     Pain Location  Breast    Pain Orientation  Right;Medial    Pain Descriptors / Indicators  Stabbing    Pain Type  Chronic pain    Pain Onset  More than a month ago    Pain Frequency  Intermittent         [facility] PT Assessment - 03/14/19 0001      Assessment   Medical Diagnosis  q              Outpatient Rehab from [facility]17/[facility]  Lymphedema Life Impact Scale Total Score  26.47 %           [facility] Adult PT Treatment/Exercise - 03/14/19 0001      Manual Therapy   Manual Therapy  Edema management;Soft tissue mobilization;Manual Lymphatic Drainage (MLD)    Scapular Mobilization  In Lt S/L for Rt protraction and  retraction    Manual Lymphatic Drainage (MLD)  In Supine: Short neck, superficial and deep abdominals, Lt axilla and Rt inguinal nodes, anterior inter-axillary and Rt axillo-inguinal anastomosis, then focused on Rt breast, then into Lt S/L for posterior inter-axillary and further work along Rt axillo-inguinal anastomosis  Extra time spent on posterior anastamosis.    Passive ROM  to right shoulder in flexion abduction and diagonals.  Pt was able to raise arm overhead resting on pillow with slight elbow flexion, but she had pulling along pec major tendon with fullness palpated in axilla. Pt very tender in this area.                    PT Long Term Goals - 03/14/19 1626      PT LONG TERM GOAL #1   Baseline  Pt states she still has the swelling and is not sure what percent to put on it., 02/22/2019 not quite 50%    Time  4    Period  Weeks    Status  On-going      PT LONG TERM GOAL #2   Title  Pt will demonstrate 150 degrees of right shoulder flexion to allow her to reach overhead    Baseline  135 on 01/25/2019,  130 degrees on 7/20 with pain,    Period  Weeks    Status  On-going      PT LONG TERM GOAL #3   Title  Pt will report decreased pain in right breast when reaching to 4/10    Baseline  8/10 on eval, 5/10 on 7/20/202,  4/10 on 02/22/2019    Time  4    Period  Weeks    Status  On-going      PT LONG TERM GOAL #4   Title  Pt will be independent in self breast MLD and use of compression for long term management of swelling    Baseline  pt reports she is still a little "heavy handed' with MLD.  She is awaing a trial of a compression pump    Time  4    Period  Weeks    Status  On-going      PT LONG TERM GOAL #5   Title  Pt will be independent in a home exercise program for continued strengthening and stretching    Time  4    Period  Weeks    Status  On-going              Plan - 03/14/19 1620    Clinical Impression Statement  Pt has made some initial gains with self  MLD,  compression bra with chip packs around lateral incision area of breast, elevation of breast and exercise of shoulder,  but she still has 2+ pitting edema in medial right breast .There is firm boggy tightness in axilla, lateral chest and scapular area that is persistent in addition to the breast lymphedema.  Because of this, she would benefit from the adjustability options of the advanced lymphapress pump with the compression garment that targets the axillary region    Comorbidities  previous breast infection, pt is not doing the exercis and self MLD as previously taught , diabetes ,    PT Frequency  1x / week    PT Duration  4 weeks    PT Treatment/Interventions  ADLs/Self Care Home Management;Patient/family education;Therapeutic activities;Therapeutic exercise;Scar mobilization;Compression bandaging;Manual lymph drainage;Manual techniques;Passive range of motion;Taping;Vasopneumatic Device;Orthotic Fit/Training    PT Next Visit Plan  follow up to see if she is getting the lymphapress pump Continue MLD and manual work   focus on self MLD indepedence, and use of compression foam. then continue Rt shoulder ROM/axillary STM and Rt breast MLD for pain relief.    Consulted and Agree with Plan of Care  Patient       Patient will benefit from skilled therapeutic intervention in order to improve the following deficits and impairments:  Decreased range of motion, Decreased strength, Increased edema, Postural dysfunction, Decreased scar mobility, Impaired perceived functional ability, Pain, Impaired UE functional use, Increased muscle spasms, Obesity  Visit Diagnosis: 1. Lymphedema, not elsewhere classified   2. Stiffness of right shoulder joint   3. Abnormal posture   4. Disorder of the skin and subcutaneous tissue related to radiation, unspecified   5. Muscle weakness (generalized)        Problem List Patient Active Problem List   Diagnosis Date Noted  . Genetic testing 02/04/2018  . Family  history of pancreatic cancer   . Family history of prostate cancer   . Family history of breast cancer   . Malignant neoplasm of upper-outer quadrant of right breast in female, estrogen receptor positive ([place]) 01/07/2018  . Moderate persistent asthma without complication 40/97/3532  . Seasonal and perennial allergic rhinitis 04/26/2017  . Allergy 01/28/2017  . Allergic rhinitis 11/28/2015  . Cough 11/28/2015  . Breast pain 08/25/2012  . Left breast mass 10/29/2011  . Symptomatic cholecystitis 02/24/2011  . HYPOTHYROIDISM 10/02/2010  . HYPERTENSION 10/02/2010  . G E R D 10/02/2010  . OSTEOARTHRITIS 10/02/2010   Donato Heinz. Owens Shark, PT  Norwood Levo 03/14/2019, 4:27 PM  [place], [place], [address] Phone: [phone]   Fax:  [phone]  Name: Lisa Horton MRN: [id number] Date of Birth: 07/20/1946  PHYSICAL THERAPY DISCHARGE SUMMARY  Visits from Start of Care: 10  Current functional level related to goals / functional outcomes: unknown   Remaining deficits: unknown   Education / Equipment: Management of lymphedema  Plan: Patient agrees to discharge.  Patient goals were partially met. Patient is being discharged due to not returning since the last visit.  ?????    Maudry Diego, PT 09/04/19 10:49 AM

## 2019-03-17 ENCOUNTER — Other Ambulatory Visit: Payer: Self-pay

## 2019-03-21 ENCOUNTER — Encounter

## 2019-03-21 ENCOUNTER — Ambulatory Visit (INDEPENDENT_AMBULATORY_CARE_PROVIDER_SITE_OTHER): Payer: Medicare Other | Admitting: Obstetrics & Gynecology

## 2019-03-21 ENCOUNTER — Other Ambulatory Visit: Payer: Self-pay

## 2019-03-21 ENCOUNTER — Other Ambulatory Visit (HOSPITAL_COMMUNITY)
Admission: RE | Admit: 2019-03-21 | Discharge: 2019-03-21 | Disposition: A | Discharge status: Home / Self Care | Payer: Medicare Other | Source: Ambulatory Visit | Attending: Obstetrics & Gynecology | Admitting: Obstetrics & Gynecology

## 2019-03-21 ENCOUNTER — Encounter: Payer: Self-pay | Admitting: Obstetrics & Gynecology

## 2019-03-21 VITALS — BP 130/76 | HR 92 | Temp 97.3°F | Ht 62.75 in | Wt 223.0 lb

## 2019-03-21 DIAGNOSIS — Z01419 Encounter for gynecological examination (general) (routine) without abnormal findings: Secondary | ICD-10-CM | POA: Diagnosis not present

## 2019-03-21 DIAGNOSIS — Z124 Encounter for screening for malignant neoplasm of cervix: Secondary | ICD-10-CM | POA: Insufficient documentation

## 2019-03-21 NOTE — Progress Notes (Signed)
73 y.o. [id number] Single Black or [place] American female here for annual exam.  Had breast cancer diagnosed last year.  Had lumpectomy with lymph node removal.  Margins were positive, so had to have repeat lumpectomy.  Then had radiation.  Having some lymphedema in right underarm.  She is going to PT.  Was on arimidex but stopped due to knee.  She is off this for two weeks and her knee is some better.    Denies vaginal bleeding.  PCP:  [name], DO.  HbA1C was 7.0.  She had been on prednisone.    Patient's last menstrual period was 08/03/1984 (approximate).          Sexually active: No The current method of family planning is status post hysterectomy.    Exercising: No.   Smoker:  no  Health Maintenance: Pap:  09/14/16 Neg  History of abnormal Pap:  yes MMG:  02/02/19 Diagnostic Bilateral BIRADS2:benign. F/u 1 year. Colonoscopy:  04/2017 Normal. F/u 5 years  BMD:  01/29/17 Osteopenia.  Pt reports she did a repeat in June.   TDaP:  2014  Pneumonia vaccine(s):  2015 Shingrix:   No Hep C testing: 09/14/16 neg  Screening Labs: PCP   reports that she quit smoking about 10 years ago. Her smoking use included cigarettes. She has a 48.00 pack-year smoking history. She has never used smokeless tobacco. She reports current alcohol use. She reports that she does not use drugs.  Past Medical History:  Diagnosis Date  . Acid reflux   . Allergic rhinitis   . Arthritis   . Asthma   . Breast lump   . Bruises easily   . Cancer ([place])   . Chicken pox   . Colon polyp   . Family history of breast cancer   . Family history of pancreatic cancer   . Family history of prostate cancer   . Gum disease   . Hypertension   . Hypothyroidism   . Incontinence   . Measles   . Myopathy    arms and legs, statin drug related  . Night sweats   . Nipple discharge   . Pre-diabetes   . Prediabetes 06/2014    Past Surgical History:  Procedure Laterality Date  . ABDOMINAL HYSTERECTOMY  1986   secondary  to fibroids  . BREAST LUMPECTOMY WITH RADIOACTIVE SEED AND SENTINEL LYMPH NODE BIOPSY Right 03/09/2018   Procedure: RIGHT BREAST LUMPECTOMY WITH BRACKETED RADIOACTIVE SEED AND SENTINEL LYMPH NODE BIOPSY;  Surgeon: Rolm Bookbinder, MD;  Location: [facility];  Service: General;  Laterality: Right;  . BREAST SURGERY  07/09/2008   mass removal  . CHOLECYSTECTOMY  03/17/11  . RE-EXCISION OF BREAST LUMPECTOMY Right 03/31/2018   Procedure: RE-EXCISION OF BREAST LUMPECTOMY;  Surgeon: Rolm Bookbinder, MD;  Location: [facility];  Service: General;  Laterality: Right;  . SKIN TAG REMOVAL     brow and lid  . THIGH / KNEE SOFT TISSUE BIOPSY  09/05/2009  . TUBAL LIGATION  1980    Current Outpatient Medications  Medication Sig Dispense Refill  . Albuterol Sulfate (PROAIR RESPICLICK) 791 (90 Base) MCG/ACT AEPB Inhale 2 puffs into the lungs every 4 (four) hours as needed. 1 each 0  . CALCIUM PO Take 1 tablet by mouth daily. Reported on 01/06/2016    . cetirizine (ZYRTEC) 10 MG tablet Take 10 mg by mouth daily.      . fluticasone furoate-vilanterol (BREO ELLIPTA) 200-25 MCG/INH AEPB Inhale 1 puff into  the lungs daily. 30 each 5  . irbesartan (AVAPRO) 300 MG tablet Take 300 mg by mouth daily.     Marland Kitchen levothyroxine (SYNTHROID, LEVOTHROID) 112 MCG tablet Take 1 tablet by mouth daily.    . Multiple Vitamins-Minerals (MULTIVITAMINS THER. W/MINERALS) TABS Take 1 tablet by mouth daily.    Marland Kitchen nystatin ointment (MYCOSTATIN) Apply 1 application topically 2 (two) times daily. Apply to affected area for up to 7 days. 30 g 0  . SYMBICORT 160-4.5 MCG/ACT inhaler Inhale 2 puffs into the lungs 2 (two) times daily.    Marland Kitchen triamcinolone (KENALOG) 0.025 % ointment Apply 1 application topically 2 (two) times daily. Apply to affected area for 7 days. 30 g 0  . Vitamin D, Ergocalciferol, (DRISDOL) 50000 units CAPS capsule Take 1 capsule by mouth once a week.  3  . anastrozole (ARIMIDEX) 1 MG tablet TAKE 1  TABLET BY MOUTH  DAILY (Patient not taking: Reported on 03/21/2019) 90 tablet 3   No current facility-administered medications for this visit.     Family History  Problem Relation Age of Onset  . Hypertension Mother   . Pancreatic cancer Mother 60  . Hypertension Father   . Prostate cancer Father 41  . Emphysema Father   . Hypertension Sister   . Breast cancer Sister        dx >50, caught very early  . Hypertension Brother   . Alzheimer's disease Brother   . Hypertension Sister   . Heart failure Brother   . Hypertension Brother   . Breast cancer Daughter 89       BRCA1/2 negative, never had panel testing  . Allergies Sister   . Allergies Brother   . Throat cancer Maternal Uncle     Review of Systems  Endocrine: Positive for cold intolerance and heat intolerance.  Musculoskeletal: Positive for arthralgias.  Psychiatric/Behavioral: Positive for behavioral problems.  All other systems reviewed and are negative.   Exam:   BP 130/76   Pulse 92   Temp (!) 97.3 F (36.3 C) (Temporal)   Ht 5' 2.75" (1.594 m)   Wt 223 lb (101.2 kg)   LMP 08/03/1984 (Approximate)   BMI 39.82 kg/m   Height: 5' 2.75" (159.4 cm)  Ht Readings from Last 3 Encounters:  03/21/19 5' 2.75" (1.594 m)  11/29/18 '5\' 2"'  (1.575 m)  11/07/18 '5\' 2"'  (1.575 m)    General appearance: alert, cooperative and appears stated age Head: Normocephalic, without obvious abnormality, atraumatic Neck: no adenopathy, supple, symmetrical, trachea midline and thyroid normal to inspection and palpation Lungs: clear to auscultation bilaterally Breasts: well healed left breast scars and axillary scars, radiation changes on left breast; left breast with no masses, skin changes, LAD, nipple discharge Heart: regular rate and rhythm Abdomen: soft, non-tender; bowel sounds normal; no masses,  no organomegaly Extremities: extremities normal, atraumatic, no cyanosis or edema Skin: Skin color, texture, turgor normal. No rashes or  lesions Lymph nodes: Cervical, supraclavicular, and axillary nodes normal. No abnormal inguinal nodes palpated Neurologic: Grossly normal   Pelvic: External genitalia:  no lesions              Urethra:  normal appearing urethra with no masses, tenderness or lesions              Bartholins and Skenes: normal                 Vagina: normal appearing vagina with normal color and discharge, no lesions  Cervix: absent              Pap taken: yes Bimanual Exam:  Uterus:  uterus absent              Adnexa: no mass, fullness, tenderness               Rectovaginal: Confirms               Anus:  normal sphincter tone, no lesions  Chaperone was present for exam.  A:  Well Woman with normal exam H/o Stage IA breast cancer, ER/PR +, Her 2 neu negative; s/p lumpectomy x 2, radiation Osteopenia Hypertension H/o TAH  P:   Mammogram, diagnostic, yearly for five years after diagnosis pap smear obtained today Lab work UTD with Dr. Theda Sers Declines Shingrix vaccination.  Others are UTD. Release for BMD signed today Return annually or prn

## 2019-03-22 LAB — CYTOLOGY - PAP: Diagnosis: NEGATIVE

## 2019-04-04 DIAGNOSIS — M79672 Pain in left foot: Secondary | ICD-10-CM | POA: Diagnosis not present

## 2019-04-04 DIAGNOSIS — M25562 Pain in left knee: Secondary | ICD-10-CM | POA: Diagnosis not present

## 2019-04-04 DIAGNOSIS — M25572 Pain in left ankle and joints of left foot: Secondary | ICD-10-CM | POA: Diagnosis not present

## 2019-04-05 ENCOUNTER — Other Ambulatory Visit (HOSPITAL_COMMUNITY): Payer: Self-pay | Admitting: Family Medicine

## 2019-04-05 ENCOUNTER — Other Ambulatory Visit: Payer: Self-pay

## 2019-04-05 ENCOUNTER — Ambulatory Visit (HOSPITAL_COMMUNITY)
Admission: RE | Admit: 2019-04-05 | Discharge: 2019-04-05 | Disposition: A | Discharge status: Home / Self Care | Payer: Medicare Other | Source: Ambulatory Visit | Attending: Family Medicine | Admitting: Family Medicine

## 2019-04-05 DIAGNOSIS — M7989 Other specified soft tissue disorders: Secondary | ICD-10-CM | POA: Insufficient documentation

## 2019-04-05 DIAGNOSIS — M79605 Pain in left leg: Secondary | ICD-10-CM

## 2019-04-05 NOTE — Progress Notes (Addendum)
LLE venous duplex       has been completed. Preliminary results can be found under CV proc through chart review. June Leap, BS, RDMS, RVT    Called results to [facility]. On hold for 9+ minutes.

## 2019-04-07 DIAGNOSIS — I890 Lymphedema, not elsewhere classified: Secondary | ICD-10-CM | POA: Diagnosis not present

## 2019-04-07 NOTE — Progress Notes (Signed)
[place]   [phone] [phone]   Clinic Follow up Note   Patient Care Team: [name], DO as PCP - General (Family Medicine) [name], MD as Consulting Physician (General Surgery) Truitt Merle, MD as Consulting Physician (Hematology) Gery Pray, MD as Consulting Physician (Radiation Oncology) Alla Feeling, NP as Nurse Practitioner (Nurse Practitioner)  Date of Service:  04/13/2019  CHIEF COMPLAINT:  F/u of right breast cancer   SUMMARY OF ONCOLOGIC HISTORY: Oncology History Overview Note  Cancer Staging Malignant neoplasm of upper-outer quadrant of right breast in female, estrogen receptor positive ([place]) Staging form: Breast, AJCC 8th Edition - Clinical stage from 01/04/2018: Stage IA (cT1c, cN0, cM0, G2, ER+, PR+, HER2-) - Signed by Truitt Merle, MD on 01/11/2018 - Pathologic: Stage IA (pT2, pN0, cM0, G2, ER+, PR+, HER2-) - Signed by Nicholas Lose, MD on 06/21/2018     Malignant neoplasm of upper-outer quadrant of right breast in female, estrogen receptor positive ([place])  12/28/2017 Mammogram   Bilateral diagnostic mammography with tomography and right breast ultrasonography at [facility] on 12/28/2017 showing: The irregular architectural distortion in the right breast upper outer quadrant posterior depth is indeterminate. The architectural distortion in the right breast upper outer quadrant middle depth is indeterminate.    01/03/2018 Pathology Results   Right needle core biopsy with pathology showing: Breast, right, needle core biopsy, (A) 11 o'clock with invasive mammary carcinoma, grade I-II. Breast, right, needle core biopsy, (B) 11 o'clock with invasive mammary carcinoma, grade I-II. Prognostic indicators significant for: ER, 60% positive with moderate staining intensity and PR, 90% positive with strong staining intensity. Proliferation marker Ki67 at 1%. HER2 negative.   01/04/2018 Cancer Staging   Staging form: Breast, AJCC 8th Edition -  Clinical stage from 01/04/2018: Stage IA (cT1c, cN0, cM0, G2, ER+, PR+, HER2-) - Signed by Truitt Merle, MD on 01/11/2018   01/25/2018 Genetic Testing   The Multi-Cancer Panel offered by [facility] includes sequencing and/or deletion duplication testing of the following 83 genes: ALK, APC, ATM, AXIN2,BAP1,  BARD1, BLM, BMPR1A, BRCA1, BRCA2, BRIP1, CASR, CDC73, CDH1, CDK4, CDKN1B, CDKN1C, CDKN2A (p14ARF), CDKN2A (p16INK4a), CEBPA, CHEK2, CTNNA1, DICER1, DIS3L2, EGFR (c.2369C>T, p.Thr790Met variant only), EPCAM (Deletion/duplication testing only), FH, FLCN, GATA2, GPC3, GREM1 (Promoter region deletion/duplication testing only), HOXB13 (c.251G>A, p.Gly84Glu), HRAS, KIT, MAX, MEN1, MET, MITF (c.952G>A, p.Glu318Lys variant only), MLH1, MSH2, MSH3, MSH6, MUTYH, NBN, NF1, NF2, NTHL1, PALB2, PDGFRA, PHOX2B, PMS2, POLD1, POLE, POT1, PRKAR1A, PTCH1, PTEN, RAD50, RAD51C, RAD51D, RB1, RECQL4, RET, RUNX1, SDHAF2, SDHA (sequence changes only), SDHB, SDHC, SDHD, SMAD4, SMARCA4, SMARCB1, SMARCE1, STK11, SUFU, TERC, TERT, TMEM127, TP53, TSC1, TSC2, VHL, WRN and WT1.   Results: No pathogenic variants identified.  A Variant of Uncertain significance in BAP1 was identified c.1066C>T (p.Arg356Trp).  The date of this test report is 01/25/2018.    03/09/2018 Surgery   Right lumpectomy: ILC grade 2, 2 foci, 2 cm, 2.5 cm, superior margin positive, 0/5 lymph nodes negative, ER 60%, PR 90%, Ki-67 1%, HER-2 negative T2N0 stage Ia   03/09/2018 Oncotype testing   Recurrence score 17 Distant risk of recurrence at 9 years with AI or Tamoxifen alone is 5% There is a <1% benefit of chemotherapy   03/31/2018 Pathology Results   Reexcision: No residual cancer   05/09/2018 - 07/08/2018 Radiation Therapy   Radiation with Dr. Sondra Come 05/09/18- 07/08/18    06/21/2018 Cancer Staging   Staging form: Breast, AJCC 8th Edition - Pathologic: Stage IA (pT2, pN0, cM0, G2, ER+, PR+,  HER2-) - Signed by Nicholas Lose, MD on 06/21/2018   07/2018 -  Anti-estrogen  oral therapy   Anastrozole 62m daily starting 07/2018    Survivorship   Per LCira Rue NP       CURRENT THERAPY:  Anastrozole 144mdaily startingin12/2019. Stopped in early 03/2019 due to joint pain. Exemestane to start in 05/2019.   INTERVAL HISTORY:  Lisa MAYDENs here for a follow up of right breast cancer. She was last seen by me 6 months ago. In interim she was seen by NP Lacie. She notes she has LE swelling of feet L>R. She saw her orthopedist who gave her Meloxicam for recent knee pain. She swelling started after she was seen. She has no pain in her feet but only with her knees. She notes doppler with orthopedist was normal.  She notes she stopped Anastrozole in early 03/2019. She notes she stopped due to mood swings, joint pain and hot flashes.    REVIEW OF SYSTEMS:   Constitutional: Denies fevers, chills or abnormal weight loss Eyes: Denies blurriness of vision Ears, nose, mouth, throat, and face: Denies mucositis or sore throat Respiratory: Denies cough, dyspnea or wheezes Cardiovascular: Denies palpitation, chest discomfort (+) B/l lower extremity swelling, L>R Gastrointestinal:  Denies nausea, heartburn or change in bowel habits Skin: Denies abnormal skin rashes Lymphatics: Denies new lymphadenopathy or easy bruising Neurological:Denies numbness, tingling or new weaknesses Behavioral/Psych: Mood is stable, no new changes  All other systems were reviewed with the patient and are negative.  MEDICAL HISTORY:  Past Medical History:  Diagnosis Date  . Acid reflux   . Allergic rhinitis   . Arthritis   . Asthma   . Breast lump   . Bruises easily   . Cancer ([place]  . Chicken pox   . Colon polyp   . Family history of breast cancer   . Family history of pancreatic cancer   . Family history of prostate cancer   . Gum disease   . Hypertension   . Hypothyroidism   . Incontinence   . Measles   . Myopathy    arms and legs, statin drug related  . Night sweats    . Nipple discharge   . Pre-diabetes   . Prediabetes 06/2014    SURGICAL HISTORY: Past Surgical History:  Procedure Laterality Date  . ABDOMINAL HYSTERECTOMY  1986   secondary to fibroids  . BREAST LUMPECTOMY WITH RADIOACTIVE SEED AND SENTINEL LYMPH NODE BIOPSY Right 03/09/2018   Procedure: RIGHT BREAST LUMPECTOMY WITH BRACKETED RADIOACTIVE SEED AND SENTINEL LYMPH NODE BIOPSY;  Surgeon: WaRolm BookbinderMD;  [place] Service: General;  Laterality: Right;  . BREAST SURGERY  07/09/2008   mass removal  . CHOLECYSTECTOMY  03/17/11  . RE-EXCISION OF BREAST LUMPECTOMY Right 03/31/2018   Procedure: RE-EXCISION OF BREAST LUMPECTOMY;  Surgeon: WaRolm BookbinderMD;  [place] Service: General;  Laterality: Right;  . SKIN TAG REMOVAL     brow and lid  . THIGH / KNEE SOFT TISSUE BIOPSY  09/05/2009  . TUBAL LIGATION  1980    I have reviewed the social history and family history with the patient and they are unchanged from previous note.  ALLERGIES:  is allergic to statins; adhesive [tape]; hydrogen peroxide; lidocaine; metrogel [metronidazole]; latex; neomycin-bacitracin zn-polymyx; sulfa antibiotics; and sulfonamide derivatives.  MEDICATIONS:  Current Outpatient Medications  Medication Sig Dispense Refill  . Albuterol Sulfate (PROAIR RESPICLICK) 1016990 Base) MCG/ACT AEPB  Inhale 2 puffs into the lungs every 4 (four) hours as needed. 1 each 0  . anastrozole (ARIMIDEX) 1 MG tablet TAKE 1 TABLET BY MOUTH  DAILY (Patient not taking: Reported on 03/21/2019) 90 tablet 3  . CALCIUM PO Take 1 tablet by mouth daily. Reported on 01/06/2016    . cetirizine (ZYRTEC) 10 MG tablet Take 10 mg by mouth daily.      . fluticasone furoate-vilanterol (BREO ELLIPTA) 200-25 MCG/INH AEPB Inhale 1 puff into the lungs daily. 30 each 5  . irbesartan (AVAPRO) 300 MG tablet Take 300 mg by mouth daily.     Marland Kitchen levothyroxine (SYNTHROID, LEVOTHROID) 112 MCG tablet Take 1  tablet by mouth daily.    . Multiple Vitamins-Minerals (MULTIVITAMINS THER. W/MINERALS) TABS Take 1 tablet by mouth daily.    Marland Kitchen nystatin ointment (MYCOSTATIN) Apply 1 application topically 2 (two) times daily. Apply to affected area for up to 7 days. 30 g 0  . SYMBICORT 160-4.5 MCG/ACT inhaler Inhale 2 puffs into the lungs 2 (two) times daily.    Marland Kitchen triamcinolone (KENALOG) 0.025 % ointment Apply 1 application topically 2 (two) times daily. Apply to affected area for 7 days. 30 g 0  . Vitamin D, Ergocalciferol, (DRISDOL) 50000 units CAPS capsule Take 1 capsule by mouth once a week.  3   No current facility-administered medications for this visit.     PHYSICAL EXAMINATION: ECOG PERFORMANCE STATUS: 2 - Symptomatic, <50% confined to bed  Vitals:   04/13/19 1350 04/13/19 1352  BP: (!) 170/88 (!) 169/86  Pulse: 96   Resp: 17   Temp: 98 F (36.7 C)   SpO2: 98%    Filed Weights   04/13/19 1350  Weight: 227 lb 8 oz (103.2 kg)    GENERAL:alert, no distress and comfortable SKIN: skin color, texture, turgor are normal, no rashes or significant lesions EYES: normal, Conjunctiva are pink and non-injected, sclera clear  NECK: supple, thyroid normal size, non-tender, without nodularity LYMPH:  no palpable lymphadenopathy in the cervical, axillary  LUNGS: clear to auscultation and percussion with normal breathing effort HEART: regular rate & rhythm and no murmurs (+) B/l lower extremity edema of feet, L>R with left calf tenderness ABDOMEN:abdomen soft, non-tender and normal bowel sounds Musculoskeletal:no cyanosis of digits and no clubbing  NEURO: alert & oriented x 3 with fluent speech, no focal motor/sensory deficits BREAST: S/p right lumpectomy: Surgical incision healed well (+) moderate Right breast lymphedema, especially in ILQ with tenderness (+) Skin hyperpigmentation from RT. No palpable mass, nodules bilaterally. Left breast exam benign.   LABORATORY DATA:  I have reviewed the data as  listed CBC Latest Ref Rng & Units 04/13/2019 10/10/2018 01/12/2018  WBC 4.0 - 10.5 K/uL 5.5 4.8 5.4  Hemoglobin 12.0 - 15.0 g/dL 12.3 12.1 12.8  Hematocrit 36.0 - 46.0 % 39.1 38.6 39.7  Platelets 150 - 400 K/uL 152 143(L) 171     CMP Latest Ref Rng & Units 04/13/2019 10/10/2018 01/12/2018  Glucose 70 - 99 mg/dL 121(H) 120(H) 121  BUN 8 - 23 mg/dL _0 Creatinine 0.44 - 1.00 mg/dL 1.02(H) 0.97 1.07  Sodium 135 - 145 mmol/L 140 141 140  Potassium 3.5 - 5.1 mmol/L 4.2 4.3 4.5  Chloride 98 - 111 mmol/L 105 106 106  CO2 22 - 32 mmol/L _1 Calcium 8.9 - 10.3 mg/dL 9.4 8.3(L) 9.3  Total Protein 6.5 - 8.1 g/dL 7.7 7.8 8.0  Total Bilirubin 0.3 - 1.2 mg/dL 0.6 0.7  0.4  Alkaline Phos 38 - 126 U/L 64 46 58  AST 15 - 41 U/L _0 ALT 0 - 44 U/L _1 RADIOGRAPHIC STUDIES: I have personally reviewed the radiological images as listed and agreed with the findings in the report. No results found.   ASSESSMENT & PLAN:  Lisa Horton is a 73 y.o. female with   1.Malignant neoplasm of upper outer quadrant of right breast, invasive lobular carcinoma, Stage IA, cT1cN0M0,G2,ER (+), PR (+), HER2 negative -She was diagnosed in 01/2018. She is s/p right breast lumpectomy and adjuvant radiation. -Her Oncotype score showed low riskwithrecurrence score17.I do not recommend adjuvantchemotherapy.  -She started anti-estrogen therapy with Anastrozole in 07/2018. She stopped anastrozole in 03/2019 due to hot flashes, mood swings, worsening joint pain, mainly in knee and wrists which greatly impacted her ambulation. Pain much improved after stopping anastrozole. -I discussed switching her to another antiestrogen medication such as Exemestane or Tamoxifen. Due to her recently leg swelling and sedentary life style due to her knee pain, I think she is at risk for thrombosis if I start her on tamoxifen.  I recommend she try exemestane first. I reviewed side effects of both. She agrees to try  Exemestane, will start her in 4 weeks when her knee pain improves.  -She is clinically doing well. Lab reviewed, her CBC and CMP are within normal limits except BG 121, Cr 1.02. Her physical exam unremarkable except right breast lymphedema even s/p PT. Her 02/2019 mammogram was benign. There is no clinical concern for recurrence. -Next mammogram in 02/2020 -f/u in 4-5 months  -I offered her the flu shot today, she declined for now  2. Osteopenia -Her 01/2017 DEXA showed Osteopenia with lowest T-Score at -2.2 at Right Femur Neck -I also discussed letrozole can weaken her bone. -Continue Calcium and Vitamin D and weight bearing exercise  -02/2019 DEXA shows osteopenia with lowest T-score -2.2 at right femur neck, stable from last exam.   3. Knee pain, LE edema   -She saw orthopedist who gave her Meloxicam. She declined knee injections. Pain much improve.  -She also had LE edema of feet at the time. 04/04/19 doppler was negative for DVT. It is still swollen with L>R. Will monitor. I encouraged her to increase activity level, wear compression socks and elevate her feet when sitting  4. Right breast Lymphedema  -She has completed PT post surgery  -She will continue to massage and exercise  -She has compression sleeve and bras to use.    PLAN: -I prescribed her exemestane today to start in a month, when her knee pain improves   -f/u with ortho for her knee pain  -Lab and f/u in 4 months    No problem-specific Assessment & Plan notes found for this encounter.   No orders of the defined types were placed in this encounter.  All questions were answered. The patient knows to call the clinic with any problems, questions or concerns. No barriers to learning was detected. I spent 20 minutes counseling the patient face to face. The total time spent in the appointment was 25 minutes and more than 50% was on counseling and review of test results     Truitt Merle, MD 04/13/2019   I, Joslyn Devon,  am acting as scribe for Truitt Merle, MD.   I have reviewed the above documentation for accuracy and completeness, and I agree with the above.

## 2019-04-13 ENCOUNTER — Telehealth: Payer: Self-pay | Admitting: Hematology

## 2019-04-13 ENCOUNTER — Inpatient Hospital Stay: Payer: Medicare Other | Attending: Nurse Practitioner

## 2019-04-13 ENCOUNTER — Other Ambulatory Visit: Payer: Self-pay

## 2019-04-13 ENCOUNTER — Inpatient Hospital Stay (HOSPITAL_BASED_OUTPATIENT_CLINIC_OR_DEPARTMENT_OTHER): Payer: Medicare Other | Admitting: Hematology

## 2019-04-13 ENCOUNTER — Encounter: Payer: Self-pay | Admitting: Hematology

## 2019-04-13 VITALS — BP 169/86 | HR 96 | Temp 98.0°F | Resp 17 | Ht 62.75 in | Wt 227.5 lb

## 2019-04-13 DIAGNOSIS — R600 Localized edema: Secondary | ICD-10-CM | POA: Insufficient documentation

## 2019-04-13 DIAGNOSIS — N951 Menopausal and female climacteric states: Secondary | ICD-10-CM | POA: Diagnosis not present

## 2019-04-13 DIAGNOSIS — C50411 Malignant neoplasm of upper-outer quadrant of right female breast: Secondary | ICD-10-CM | POA: Insufficient documentation

## 2019-04-13 DIAGNOSIS — Z170 Estrogen receptor positive status [ER+]: Secondary | ICD-10-CM | POA: Diagnosis not present

## 2019-04-13 DIAGNOSIS — I890 Lymphedema, not elsewhere classified: Secondary | ICD-10-CM | POA: Diagnosis not present

## 2019-04-13 DIAGNOSIS — M25569 Pain in unspecified knee: Secondary | ICD-10-CM | POA: Insufficient documentation

## 2019-04-13 DIAGNOSIS — M8580 Other specified disorders of bone density and structure, unspecified site: Secondary | ICD-10-CM | POA: Insufficient documentation

## 2019-04-13 LAB — CBC WITH DIFFERENTIAL (CANCER CENTER ONLY)
Abs Immature Granulocytes: 0.02 10*3/uL (ref 0.00–0.07)
Basophils Absolute: 0 10*3/uL (ref 0.0–0.1)
Basophils Relative: 1 %
Eosinophils Absolute: 0.4 10*3/uL (ref 0.0–0.5)
Eosinophils Relative: 6 %
HCT: 39.1 % (ref 36.0–46.0)
Hemoglobin: 12.3 g/dL (ref 12.0–15.0)
Immature Granulocytes: 0 %
Lymphocytes Relative: 23 %
Lymphs Abs: 1.3 10*3/uL (ref 0.7–4.0)
MCH: 29.4 pg (ref 26.0–34.0)
MCHC: 31.5 g/dL (ref 30.0–36.0)
MCV: 93.5 fL (ref 80.0–100.0)
Monocytes Absolute: 0.4 10*3/uL (ref 0.1–1.0)
Monocytes Relative: 7 %
Neutro Abs: 3.4 10*3/uL (ref 1.7–7.7)
Neutrophils Relative %: 63 %
Platelet Count: 152 10*3/uL (ref 150–400)
RBC: 4.18 MIL/uL (ref 3.87–5.11)
RDW: 13.1 % (ref 11.5–15.5)
WBC Count: 5.5 10*3/uL (ref 4.0–10.5)
nRBC: 0 % (ref 0.0–0.2)

## 2019-04-13 LAB — CMP (CANCER CENTER ONLY)
ALT: 25 U/L (ref 0–44)
AST: 25 U/L (ref 15–41)
Albumin: 4 g/dL (ref 3.5–5.0)
Alkaline Phosphatase: 64 U/L (ref 38–126)
Anion gap: 7 (ref 5–15)
BUN: 11 mg/dL (ref 8–23)
CO2: 28 mmol/L (ref 22–32)
Calcium: 9.4 mg/dL (ref 8.9–10.3)
Chloride: 105 mmol/L (ref 98–111)
Creatinine: 1.02 mg/dL — ABNORMAL HIGH (ref 0.44–1.00)
GFR, Est AFR Am: >60 mL/min (ref >60)
GFR, Estimated: 56 mL/min — ABNORMAL LOW (ref >60)
Glucose, Bld: 121 mg/dL — ABNORMAL HIGH (ref 70–99)
Potassium: 4.2 mmol/L (ref 3.5–5.1)
Sodium: 140 mmol/L (ref 135–145)
Total Bilirubin: 0.6 mg/dL (ref 0.3–1.2)
Total Protein: 7.7 g/dL (ref 6.5–8.1)

## 2019-04-13 NOTE — Telephone Encounter (Signed)
Scheduled appt per 9/10 los.  Left a voice message of appt date and time.

## 2019-04-18 DIAGNOSIS — H40052 Ocular hypertension, left eye: Secondary | ICD-10-CM | POA: Diagnosis not present

## 2019-04-21 DIAGNOSIS — E039 Hypothyroidism, unspecified: Secondary | ICD-10-CM | POA: Diagnosis not present

## 2019-04-21 DIAGNOSIS — E559 Vitamin D deficiency, unspecified: Secondary | ICD-10-CM | POA: Diagnosis not present

## 2019-04-21 DIAGNOSIS — I10 Essential (primary) hypertension: Secondary | ICD-10-CM | POA: Diagnosis not present

## 2019-04-21 DIAGNOSIS — G729 Myopathy, unspecified: Secondary | ICD-10-CM | POA: Diagnosis not present

## 2019-04-21 DIAGNOSIS — M7989 Other specified soft tissue disorders: Secondary | ICD-10-CM | POA: Diagnosis not present

## 2019-04-21 DIAGNOSIS — E119 Type 2 diabetes mellitus without complications: Secondary | ICD-10-CM | POA: Diagnosis not present

## 2019-04-26 DIAGNOSIS — R0602 Shortness of breath: Secondary | ICD-10-CM | POA: Diagnosis not present

## 2019-04-26 DIAGNOSIS — R0600 Dyspnea, unspecified: Secondary | ICD-10-CM | POA: Diagnosis not present

## 2019-04-26 DIAGNOSIS — M7989 Other specified soft tissue disorders: Secondary | ICD-10-CM | POA: Diagnosis not present

## 2019-04-26 IMAGING — MR MR BREAST BIOPSY
5 of 6 series · 34 of 48 positions shown · IV contrast (multihance)
Comparison: Previous exams.

ADDENDUM:
Pathology revealed BENIGN BREAST TISSUE WITH FIBROCYSTIC CHANGE,
INCLUDING SCLEROSING ADENOSIS AND ASSOCIATED MICROCALCIFICATIONS of
LEFT breast, upper, inner quadrant. This was found to be concordant
by Dr. Klpigbb Moolman.

Pathology results were discussed with the patient by telephone. The
patient reported doing well after the biopsy with tenderness at the
site. Post biopsy instructions and care were reviewed and questions
were answered. The patient was encouraged to call The [REDACTED]
The patient has a recent diagnosis of RIGHT breast cancer and should
follow her outlined treatment plan.
Pathology results reported by Bebelle Thabata, RN on 01/26/2018.
CLINICAL DATA: Patient presents for MRI guided biopsy of a nodular
area of enhancement in the left breast. Patient has recently
diagnosed right breast carcinoma. Left breast abnormalities were
noted on staging breast MRI dated 01/18/2018.
EXAM:
MRI GUIDED CORE NEEDLE BIOPSY OF THE LEFT BREAST
TECHNIQUE: Multiplanar, multisequence MR imaging of the left breast was
performed both before and after administration of intravenous
contrast.
CONTRAST:  20mL MULTIHANCE GADOBENATE DIMEGLUMINE 529 MG/ML IV SOLN

[Series 2: fiducial unilateral · sagittal · 2.0mm · 1.33mm/px · 3 of 52 slices shown]
[im 1/52]
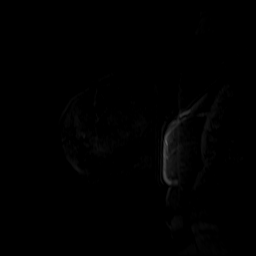
[im 26/52]
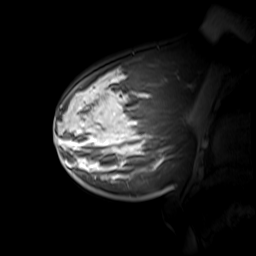
[im 52/52]
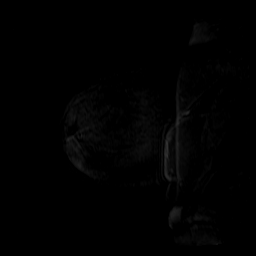

[Series 3: dynamic pre · axial · non-contrast · 1.3mm · 0.73mm/px · z∈[-90,+96]mm · 9 of 144 slices shown]
[im 1/144]
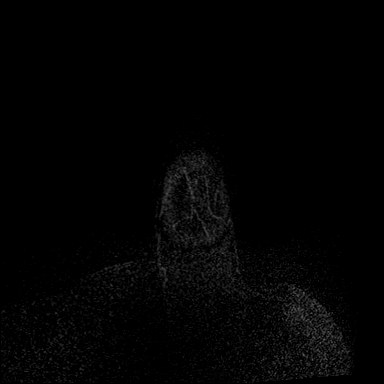
[im 18/144]
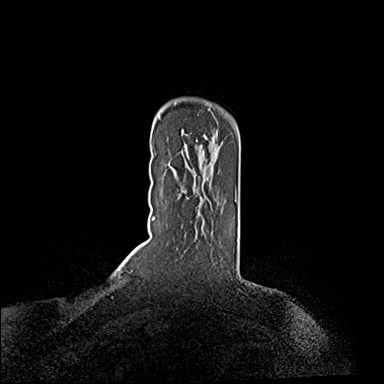
[im 36/144]
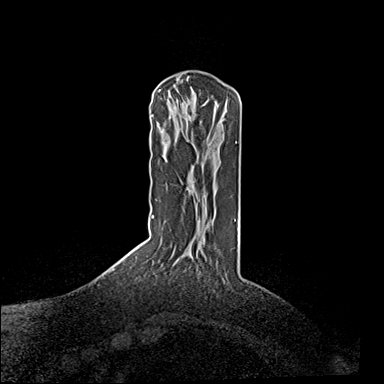
[im 54/144]
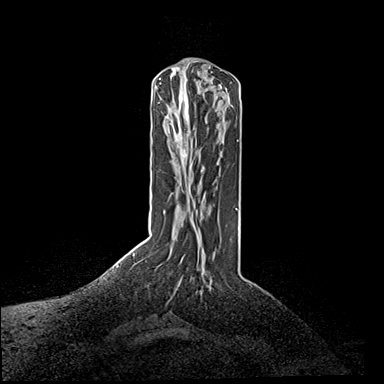
[im 72/144]
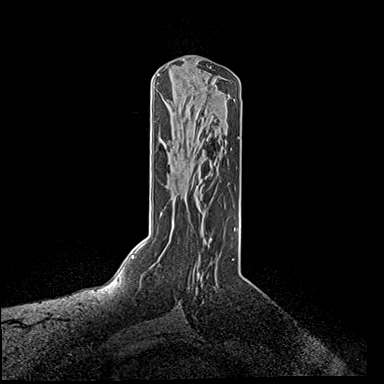
[im 90/144]
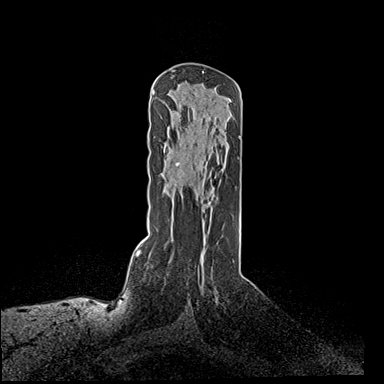
[im 108/144]
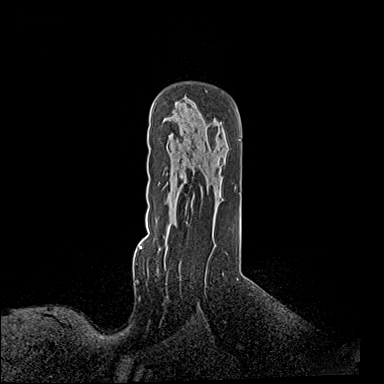
[im 126/144]
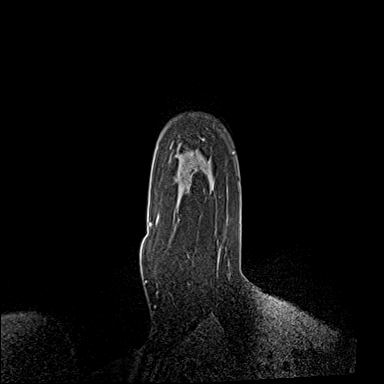
[im 144/144]
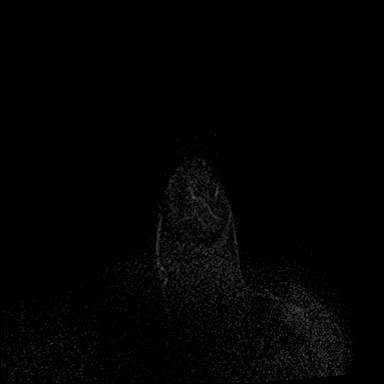

[Series 4: dynamic post 20 · axial · 1.3mm · 0.73mm/px · z∈[-90,+96]mm · 9 of 144 slices shown (1 of 2)]
[im 1/144]
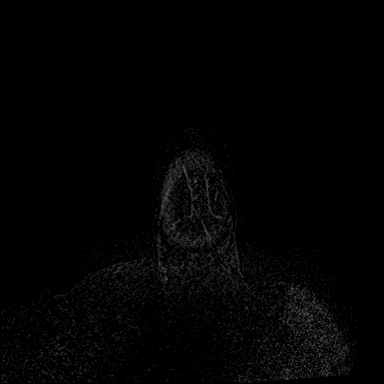
[im 18/144]
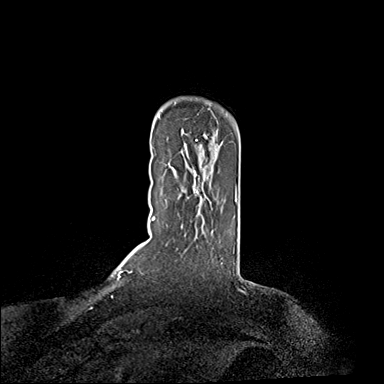
[im 36/144]
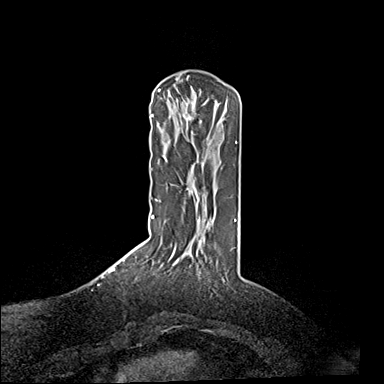
[im 54/144]
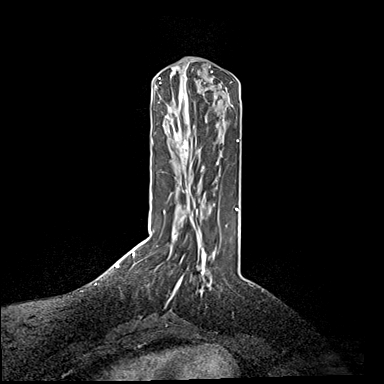
[im 72/144]
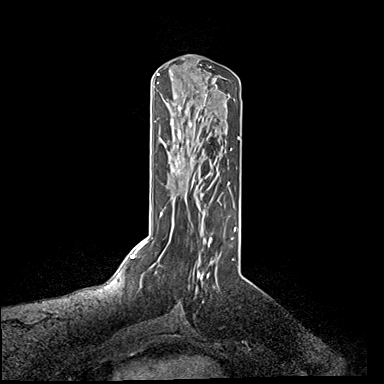
[im 90/144]
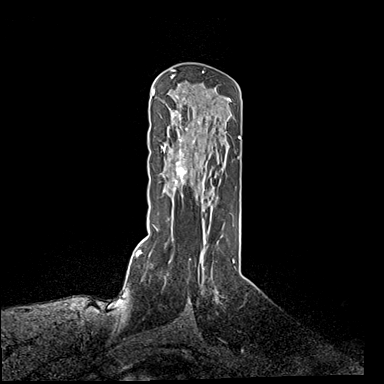
[im 108/144]
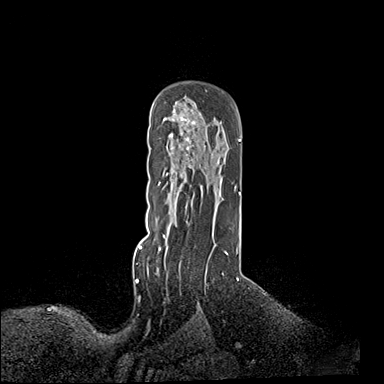
[im 126/144]
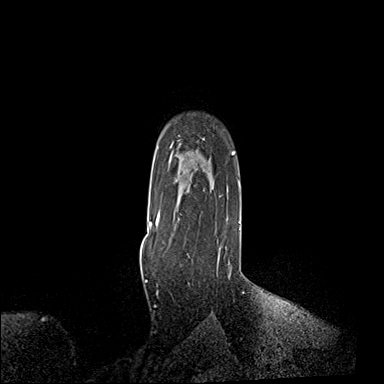
[im 144/144]
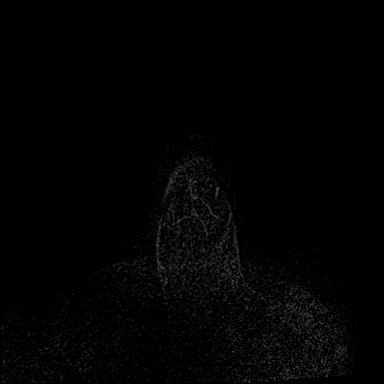

[Series 5: dynamic post 20 · axial · 1.3mm · 0.73mm/px · z∈[-90,+96]mm · 9 of 144 slices shown (2 of 2)]
[im 1/144]
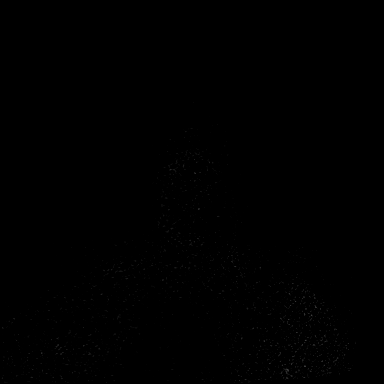
[im 18/144]
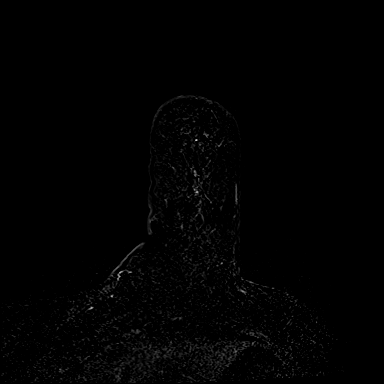
[im 36/144]
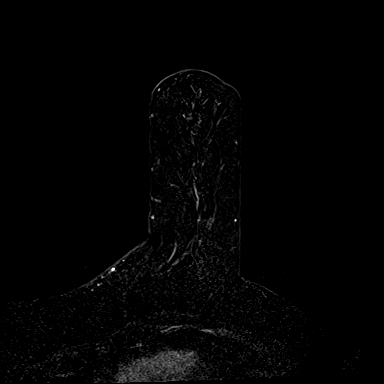
[im 54/144]
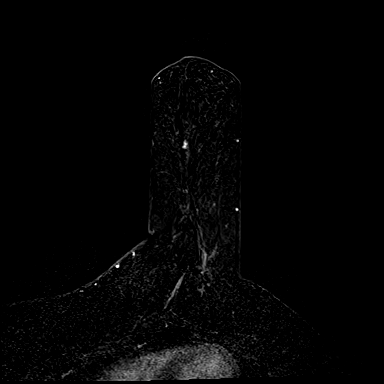
[im 72/144]
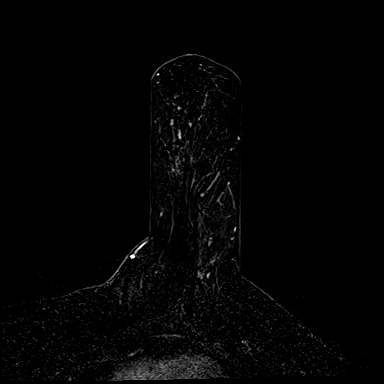
[im 90/144]
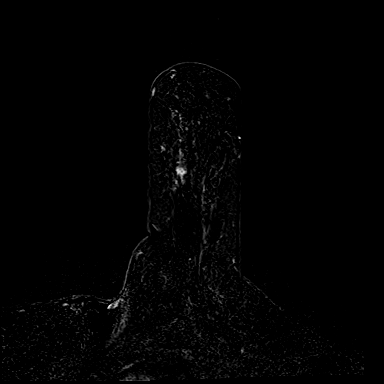
[im 108/144]
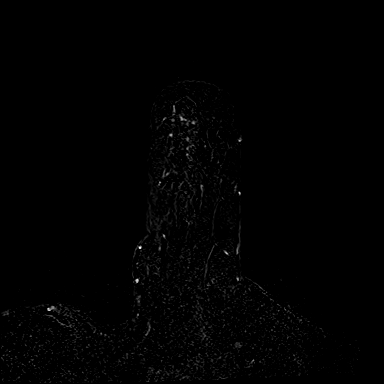
[im 126/144]
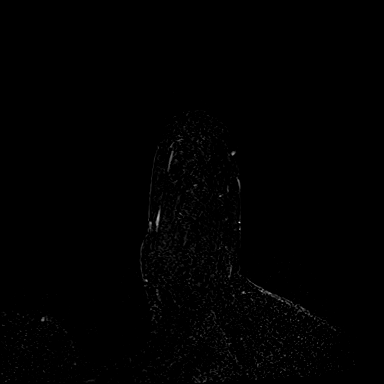
[im 144/144]
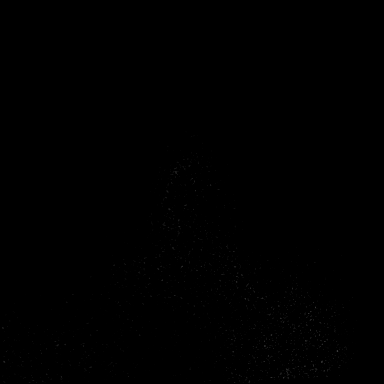

[Series 6: needle confirmation · axial · 1.3mm · 0.73mm/px · z∈[-90,-21]mm · 4 of 144 slices shown]
[im 1/144]
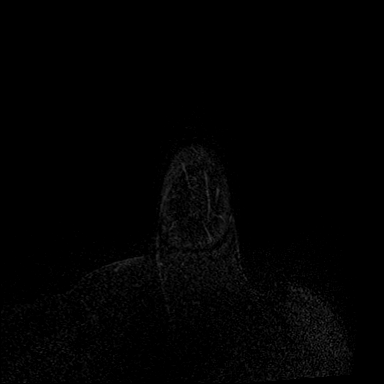
[im 18/144]
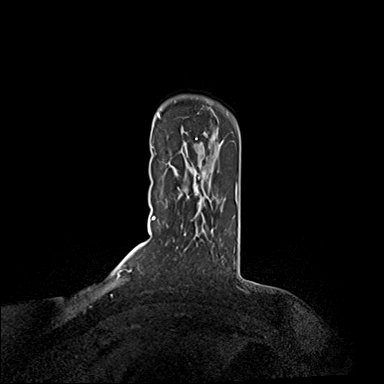
[im 36/144]
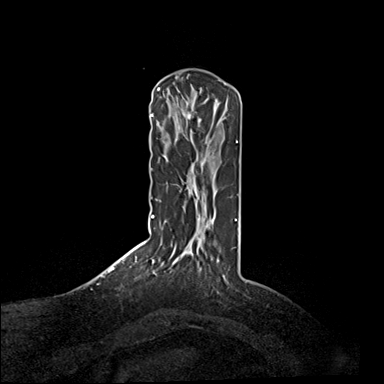
[im 54/144]
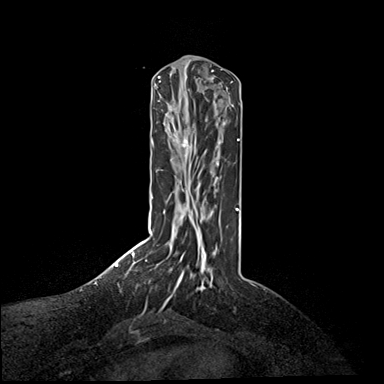

[34 of 48 positions shown; findings below may reference images not displayed]

FINDINGS: I met with the patient, and we discussed the procedure of MRI guided
biopsy, including risks, benefits, and alternatives. Specifically,
we discussed the risks of infection, bleeding, tissue injury, clip
migration, and inadequate sampling. Informed, written consent was
given. The usual time out protocol was performed immediately prior
to the procedure.

Using sterile technique, 1% Lidocaine, MRI guidance, and a 9 gauge
vacuum assisted device, biopsy was performed of 1 of the dominant
areas of nodular enhancement in the medial left breast using a
medial approach. At the conclusion of the procedure, a dumbbell
shaped tissue marker clip was deployed into the biopsy cavity.
Follow-up 2-view mammogram was performed and dictated separately.
IMPRESSION: MRI guided biopsy of the left breast.  No apparent complications.

## 2019-05-01 DIAGNOSIS — E559 Vitamin D deficiency, unspecified: Secondary | ICD-10-CM | POA: Diagnosis not present

## 2019-05-01 DIAGNOSIS — M7989 Other specified soft tissue disorders: Secondary | ICD-10-CM | POA: Diagnosis not present

## 2019-05-01 DIAGNOSIS — E119 Type 2 diabetes mellitus without complications: Secondary | ICD-10-CM | POA: Diagnosis not present

## 2019-05-01 DIAGNOSIS — I10 Essential (primary) hypertension: Secondary | ICD-10-CM | POA: Diagnosis not present

## 2019-05-01 DIAGNOSIS — G729 Myopathy, unspecified: Secondary | ICD-10-CM | POA: Diagnosis not present

## 2019-05-01 DIAGNOSIS — Z23 Encounter for immunization: Secondary | ICD-10-CM | POA: Diagnosis not present

## 2019-05-15 ENCOUNTER — Ambulatory Visit
Admission: RE | Admit: 2019-05-15 | Discharge: 2019-05-15 | Disposition: A | Discharge status: Home / Self Care | Payer: Medicare Other | Source: Ambulatory Visit | Attending: Radiation Oncology | Admitting: Radiation Oncology

## 2019-05-15 ENCOUNTER — Other Ambulatory Visit: Payer: Self-pay

## 2019-05-15 ENCOUNTER — Encounter: Payer: Self-pay | Admitting: Radiation Oncology

## 2019-05-15 VITALS — BP 144/86 | HR 101 | Temp 98.0°F | Resp 20 | Ht 62.75 in | Wt 223.4 lb

## 2019-05-15 DIAGNOSIS — C50411 Malignant neoplasm of upper-outer quadrant of right female breast: Secondary | ICD-10-CM | POA: Diagnosis not present

## 2019-05-15 DIAGNOSIS — Z170 Estrogen receptor positive status [ER+]: Secondary | ICD-10-CM | POA: Insufficient documentation

## 2019-05-15 DIAGNOSIS — M2550 Pain in unspecified joint: Secondary | ICD-10-CM | POA: Diagnosis not present

## 2019-05-15 DIAGNOSIS — Z923 Personal history of irradiation: Secondary | ICD-10-CM | POA: Insufficient documentation

## 2019-05-15 DIAGNOSIS — Z08 Encounter for follow-up examination after completed treatment for malignant neoplasm: Secondary | ICD-10-CM | POA: Diagnosis not present

## 2019-05-15 DIAGNOSIS — Z79811 Long term (current) use of aromatase inhibitors: Secondary | ICD-10-CM | POA: Diagnosis not present

## 2019-05-15 DIAGNOSIS — N6459 Other signs and symptoms in breast: Secondary | ICD-10-CM | POA: Diagnosis not present

## 2019-05-15 DIAGNOSIS — Z79899 Other long term (current) drug therapy: Secondary | ICD-10-CM | POA: Insufficient documentation

## 2019-05-15 NOTE — Progress Notes (Signed)
Radiation Oncology         [phone] ________________________________  Name: Lisa Horton MRN: [id number]  Date: 05/15/2019  DOB: 1945/09/19  Follow-Up Visit Note  CC: [name], DO  [name], MD    ICD-10-CM   1. Malignant neoplasm of upper-outer quadrant of right breast in female, estrogen receptor positive ([place])  C50.411    Z17.0     Diagnosis:   73 y.o. female with Malignant neoplasm of UOQ right breast, stage IA (pT2, pN0, cM0, G2, ER+, PR+, Her2-)   Interval Since Last Radiation:  10 months   Radiation treatment dates:   1. 05/09/18-05/27/18, 06/13/18-06/28/18 (patient was on an 18-day break in treatment after developing a seroma and breast infection) 2. 06/29/18-07/07/18  Site/dose:   1. Right breast / 50.4 Gy total in 28 fractions of 1.8 Gy 2. Boost / 2 Gy x 5 fractions for a total of 10 Gy  Narrative:  The patient returns today for routine follow-up. She last saw Dr. Burr Medico on 04/13/2019. She developed left knee pain, as well as mood swings, joint pain, and hot flashes, in mid-July 2020 and was taken off anastrozole. At her visit with Dr. Burr Medico, they opted to begin exemestane in 05/2019.  On review of systems, she reports joint pain and swelling to her left lower extremity (including her foot, ankle, and knee). She also reports head pain beginning 3 weeks ago, stating it feels like her "skull is cracked." She was advised by our nurse to see her PCP regarding this issue. Patient also notes she began using a lymphedema pump twice per day for 5 days a week.  ALLERGIES:  is allergic to statins; adhesive [tape]; hydrogen peroxide; lidocaine; metrogel [metronidazole]; latex; neomycin-bacitracin zn-polymyx; sulfa antibiotics; and sulfonamide derivatives.  Meds: Current Outpatient Medications  Medication Sig Dispense Refill  . Albuterol Sulfate (PROAIR RESPICLICK) 409 (90 Base) MCG/ACT AEPB Inhale 2 puffs into the lungs every 4 (four) hours as needed. 1 each 0  .  CALCIUM PO Take 1 tablet by mouth daily. Reported on 01/06/2016    . cetirizine (ZYRTEC) 10 MG tablet Take 10 mg by mouth daily.      . fluticasone furoate-vilanterol (BREO ELLIPTA) 200-25 MCG/INH AEPB Inhale 1 puff into the lungs daily. 30 each 5  . furosemide (LASIX) 20 MG tablet TAKE 1 TABLET BY MOUTH ONCE DAILY AS NEEDED FOR 30 DAYS    . irbesartan (AVAPRO) 300 MG tablet Take 300 mg by mouth daily.     Marland Kitchen levothyroxine (SYNTHROID, LEVOTHROID) 112 MCG tablet Take 1 tablet by mouth daily.    . Multiple Vitamins-Minerals (MULTIVITAMINS THER. W/MINERALS) TABS Take 1 tablet by mouth daily.    Marland Kitchen nystatin ointment (MYCOSTATIN) Apply 1 application topically 2 (two) times daily. Apply to affected area for up to 7 days. 30 g 0  . SYMBICORT 160-4.5 MCG/ACT inhaler Inhale 2 puffs into the lungs 2 (two) times daily.    Marland Kitchen triamcinolone (KENALOG) 0.025 % ointment Apply 1 application topically 2 (two) times daily. Apply to affected area for 7 days. 30 g 0  . Vitamin D, Ergocalciferol, (DRISDOL) 50000 units CAPS capsule Take 1 capsule by mouth once a week.  3  . anastrozole (ARIMIDEX) 1 MG tablet TAKE 1 TABLET BY MOUTH  DAILY (Patient not taking: Reported on 03/21/2019) 90 tablet 3   No current facility-administered medications for this encounter.     Physical Findings: The patient is in no acute distress. Patient is alert and oriented.  height is 5' 2.75" (1.594 m) and weight is 223 lb 6 oz (101.3 kg). Her temporal temperature is 98 F (36.7 C). Her blood pressure is 144/86 (abnormal) and her pulse is 101 (abnormal). Her respiration is 20.   Lungs are clear to auscultation bilaterally. Heart has regular rate and rhythm. No palpable cervical, supraclavicular, or axillary adenopathy. Abdomen soft, non-tender, normal bowel sounds.   Breast Exam Left Breast: Large and pendulous without mass, nipple discharge or bleeding. Right Breast: Patient continues to have significant hyperpigmentation changes in the right  breast. She continues to have a lot of edema in the breast, particularly in the lower-inner aspect.  Overall the breast seems less tense and less swelling noted. no nipple discharge or bleeding noted. No obvious mass in the breast but difficult to evaluate considering the edema. The patient did have an MRI of the breast earlier this year showing no dominant mass in the breast.  Lab Findings: Lab Results  Component Value Date   WBC 5.5 04/13/2019   HGB 12.3 04/13/2019   HCT 39.1 04/13/2019   MCV 93.5 04/13/2019   PLT 152 04/13/2019    Radiographic Findings: No results found.  Impression:  Malignant neoplasm of UOQ right breast, stage IA (pT2, pN0, cM0, G2, ER+, PR+, Her2-).   No evidence of recurrence.  Patient has seen by physical therapy and breast has been started.  Patient has been using this approximately 2 weeks so it is too early to see if there is been improvement.  Patient will continue per physical therapy recommendations  Plan:  Routine follow-up in 3 months.   ____________________________________  Blair Promise, PhD, MD  This document serves as a record of services personally performed by Gery Pray, MD. It was created on his behalf by Wilburn Mylar, a trained medical scribe. The creation of this record is based on the scribe's personal observations and the provider's statements to them. This document has been checked and approved by the attending provider.

## 2019-05-15 NOTE — Progress Notes (Signed)
Pt presents today for f/u with Dr. Sondra Come. Pt c/o joint pain and swelling, left foot/ankle/knee. Pt reports she feels like her "skull is cracked" and began about 3 weeks ago. Pt educated that head pain is NOT caused by radiation as radiation treated breast area and treatment was completed December 2019. Strongly encouraged pt to see PCP for head pain and convey to PCP that head pain is NOT the result of radiation. Pt reports using aquaphor on breast occasionally. Breast is hyperpigmented, swollen. Pt reports started using lymphedema pump 5 days/week, twice/day.  BP (!) 144/86 (BP Location: Left Arm, Patient Position: Sitting)   Pulse (!) 101   Temp 98 F (36.7 C) (Temporal)   Resp 20   Ht 5' 2.75" (1.594 m)   Wt 223 lb 6 oz (101.3 kg)   LMP 08/03/1984 (Approximate)   BMI 39.89 kg/m   Wt Readings from Last 3 Encounters:  05/15/19 223 lb 6 oz (101.3 kg)  04/13/19 227 lb 8 oz (103.2 kg)  03/21/19 223 lb (101.2 kg)   Loma Sousa, RN BSN

## 2019-05-15 NOTE — Patient Instructions (Signed)
Coronavirus (COVID-19) Are you at risk?  Are you at risk for the Coronavirus (COVID-19)?  To be considered HIGH RISK for Coronavirus (COVID-19), you have to meet the following criteria:  . Traveled to [place], [place], [place], [place] or [place]; or in the [place] to [place], [place], [place], or [place]; and have fever, cough, and shortness of breath within the last 2 weeks of travel OR . Been in close contact with a person diagnosed with COVID-19 within the last 2 weeks and have fever, cough, and shortness of breath . IF YOU DO NOT MEET THESE CRITERIA, YOU ARE CONSIDERED LOW RISK FOR COVID-19.  What to do if you are HIGH RISK for COVID-19?  . If you are having a medical emergency, call 911. . Seek medical care right away. Before you go to a doctor's office, urgent care or emergency department, call ahead and tell them about your recent travel, contact with someone diagnosed with COVID-19, and your symptoms. You should receive instructions from your physician's office regarding next steps of care.  . When you arrive at healthcare provider, tell the healthcare staff immediately you have returned from visiting [place], [place], [place], [place] or [place]; or traveled in the [place] to [place], [place], [place], or [place]; in the last two weeks or you have been in close contact with a person diagnosed with COVID-19 in the last 2 weeks.   . Tell the health care staff about your symptoms: fever, cough and shortness of breath. . After you have been seen by a medical provider, you will be either: o Tested for (COVID-19) and discharged home on quarantine except to seek medical care if symptoms worsen, and asked to  - Stay home and avoid contact with others until you get your results (4-5 days)  - Avoid travel on public transportation if possible (such as bus, train, or airplane) or o Sent to the Emergency Department by EMS for evaluation, COVID-19 testing, and possible  admission depending on your condition and test results.  What to do if you are LOW RISK for COVID-19?  Reduce your risk of any infection by using the same precautions used for avoiding the common cold or flu:  . Wash your hands often with soap and warm water for at least 20 seconds.  If soap and water are not readily available, use an alcohol-based hand sanitizer with at least 60% alcohol.  . If coughing or sneezing, cover your mouth and nose by coughing or sneezing into the elbow areas of your shirt or coat, into a tissue or into your sleeve (not your hands). . Avoid shaking hands with others and consider head nods or verbal greetings only. . Avoid touching your eyes, nose, or mouth with unwashed hands.  . Avoid close contact with people who are sick. . Avoid places or events with large numbers of people in one location, like concerts or sporting events. . Carefully consider travel plans you have or are making. . If you are planning any travel outside or inside the [place], visit the CDC's Travelers' Health webpage for the latest health notices. . If you have some symptoms but not all symptoms, continue to monitor at home and seek medical attention if your symptoms worsen. . If you are having a medical emergency, call 911.   ADDITIONAL HEALTHCARE OPTIONS FOR PATIENTS  [facility] / e-Visit: [email or web address]         [facility]: [phone]  [facility]: [phone]                   [facility]: [phone]   

## 2019-05-16 DIAGNOSIS — M25562 Pain in left knee: Secondary | ICD-10-CM | POA: Diagnosis not present

## 2019-05-22 DIAGNOSIS — M1712 Unilateral primary osteoarthritis, left knee: Secondary | ICD-10-CM | POA: Diagnosis not present

## 2019-05-22 DIAGNOSIS — R609 Edema, unspecified: Secondary | ICD-10-CM | POA: Diagnosis not present

## 2019-05-30 DIAGNOSIS — R609 Edema, unspecified: Secondary | ICD-10-CM | POA: Diagnosis not present

## 2019-05-30 DIAGNOSIS — M1712 Unilateral primary osteoarthritis, left knee: Secondary | ICD-10-CM | POA: Diagnosis not present

## 2019-06-06 DIAGNOSIS — R609 Edema, unspecified: Secondary | ICD-10-CM | POA: Diagnosis not present

## 2019-06-06 DIAGNOSIS — M1712 Unilateral primary osteoarthritis, left knee: Secondary | ICD-10-CM | POA: Diagnosis not present

## 2019-08-11 NOTE — Progress Notes (Signed)
[place]   [phone] [phone]   Clinic Follow up Note   Patient Care Team: [name], DO as PCP - General (Family Medicine) [name], MD as Consulting Physician (General Surgery) Truitt Merle, MD as Consulting Physician (Hematology) Gery Pray, MD as Consulting Physician (Radiation Oncology) Alla Feeling, NP as Nurse Practitioner (Nurse Practitioner)  Date of Service:  08/14/2019  CHIEF COMPLAINT: F/u of right breast cancer   SUMMARY OF ONCOLOGIC HISTORY: Oncology History Overview Note  Cancer Staging Malignant neoplasm of upper-outer quadrant of right breast in female, estrogen receptor positive ([place]) Staging form: Breast, AJCC 8th Edition - Clinical stage from 01/04/2018: Stage IA (cT1c, cN0, cM0, G2, ER+, PR+, HER2-) - Signed by Truitt Merle, MD on 01/11/2018 - Pathologic: Stage IA (pT2, pN0, cM0, G2, ER+, PR+, HER2-) - Signed by Nicholas Lose, MD on 06/21/2018     Malignant neoplasm of upper-outer quadrant of right breast in female, estrogen receptor positive ([place])  12/28/2017 Mammogram   Bilateral diagnostic mammography with tomography and right breast ultrasonography at [facility] on 12/28/2017 showing: The irregular architectural distortion in the right breast upper outer quadrant posterior depth is indeterminate. The architectural distortion in the right breast upper outer quadrant middle depth is indeterminate.    01/03/2018 Pathology Results   Right needle core biopsy with pathology showing: Breast, right, needle core biopsy, (A) 11 o'clock with invasive mammary carcinoma, grade I-II. Breast, right, needle core biopsy, (B) 11 o'clock with invasive mammary carcinoma, grade I-II. Prognostic indicators significant for: ER, 60% positive with moderate staining intensity and PR, 90% positive with strong staining intensity. Proliferation marker Ki67 at 1%. HER2 negative.   01/04/2018 Cancer Staging   Staging form: Breast, AJCC 8th Edition -  Clinical stage from 01/04/2018: Stage IA (cT1c, cN0, cM0, G2, ER+, PR+, HER2-) - Signed by Truitt Merle, MD on 01/11/2018   01/25/2018 Genetic Testing   The Multi-Cancer Panel offered by [facility] includes sequencing and/or deletion duplication testing of the following 83 genes: ALK, APC, ATM, AXIN2,BAP1,  BARD1, BLM, BMPR1A, BRCA1, BRCA2, BRIP1, CASR, CDC73, CDH1, CDK4, CDKN1B, CDKN1C, CDKN2A (p14ARF), CDKN2A (p16INK4a), CEBPA, CHEK2, CTNNA1, DICER1, DIS3L2, EGFR (c.2369C>T, p.Thr790Met variant only), EPCAM (Deletion/duplication testing only), FH, FLCN, GATA2, GPC3, GREM1 (Promoter region deletion/duplication testing only), HOXB13 (c.251G>A, p.Gly84Glu), HRAS, KIT, MAX, MEN1, MET, MITF (c.952G>A, p.Glu318Lys variant only), MLH1, MSH2, MSH3, MSH6, MUTYH, NBN, NF1, NF2, NTHL1, PALB2, PDGFRA, PHOX2B, PMS2, POLD1, POLE, POT1, PRKAR1A, PTCH1, PTEN, RAD50, RAD51C, RAD51D, RB1, RECQL4, RET, RUNX1, SDHAF2, SDHA (sequence changes only), SDHB, SDHC, SDHD, SMAD4, SMARCA4, SMARCB1, SMARCE1, STK11, SUFU, TERC, TERT, TMEM127, TP53, TSC1, TSC2, VHL, WRN and WT1.   Results: No pathogenic variants identified.  A Variant of Uncertain significance in BAP1 was identified c.1066C>T (p.Arg356Trp).  The date of this test report is 01/25/2018.    03/09/2018 Surgery   Right lumpectomy: ILC grade 2, 2 foci, 2 cm, 2.5 cm, superior margin positive, 0/5 lymph nodes negative, ER 60%, PR 90%, Ki-67 1%, HER-2 negative T2N0 stage Ia   03/09/2018 Oncotype testing   Recurrence score 17 Distant risk of recurrence at 9 years with AI or Tamoxifen alone is 5% There is a <1% benefit of chemotherapy   03/31/2018 Pathology Results   Reexcision: No residual cancer   05/09/2018 - 07/08/2018 Radiation Therapy   Radiation with Dr. Sondra Come 05/09/18- 07/08/18    06/21/2018 Cancer Staging   Staging form: Breast, AJCC 8th Edition - Pathologic: Stage IA (pT2, pN0, cM0, G2, ER+, PR+, HER2-) -  Signed by Nicholas Lose, MD on 06/21/2018   07/2018 -  Anti-estrogen  oral therapy   Anastrozole '1mg'$  daily starting in 07/2018. Stopped in early 03/2019 due to joint pain. Exemestane to start in 08/2019     Survivorship   Per Cira Rue, NP       CURRENT THERAPY:  Anastrozole '1mg'$  daily startingin12/2019. Stopped in early 03/2019 due to joint pain. Exemestane to start in 08/2019   INTERVAL HISTORY:  Lisa Horton is here for a follow up of right breast cancer. She presents to the clinic alone. She notes she is doing well. She notes she still has b/l lymphedema. She went to PT and continues exercises. She also notes nodularity of right shoulder. She notes after she stopped exemestane her knee pain improved. She notes her pain is intermittent.    REVIEW OF SYSTEMS:   Constitutional: Denies fevers, chills or abnormal weight loss Eyes: Denies blurriness of vision Ears, nose, mouth, throat, and face: Denies mucositis or sore throat Respiratory: Denies cough, dyspnea or wheezes Cardiovascular: Denies palpitation, chest discomfort or lower extremity swelling Gastrointestinal:  Denies nausea, heartburn or change in bowel habits Skin: Denies abnormal skin rashes MSK: (+) Improved knee pain  Lymphatics: Denies new lymphadenopathy or easy bruising Neurological:Denies numbness, tingling or new weaknesses Behavioral/Psych: Mood is stable, no new changes  Breast: (+) B/l lymphedema  All other systems were reviewed with the patient and are negative.  MEDICAL HISTORY:  Past Medical History:  Diagnosis Date  . Acid reflux   . Allergic rhinitis   . Arthritis   . Asthma   . Breast lump   . Bruises easily   . Cancer ([place])   . Chicken pox   . Colon polyp   . Family history of breast cancer   . Family history of pancreatic cancer   . Family history of prostate cancer   . Gum disease   . Hypertension   . Hypothyroidism   . Incontinence   . Measles   . Myopathy    arms and legs, statin drug related  . Night sweats   . Nipple discharge   . Pre-diabetes     . Prediabetes 06/2014    SURGICAL HISTORY: Past Surgical History:  Procedure Laterality Date  . ABDOMINAL HYSTERECTOMY  1986   secondary to fibroids  . BREAST LUMPECTOMY WITH RADIOACTIVE SEED AND SENTINEL LYMPH NODE BIOPSY Right 03/09/2018   Procedure: RIGHT BREAST LUMPECTOMY WITH BRACKETED RADIOACTIVE SEED AND SENTINEL LYMPH NODE BIOPSY;  Surgeon: Rolm Bookbinder, MD;  Location: [place];  Service: General;  Laterality: Right;  . BREAST SURGERY  07/09/2008   mass removal  . CHOLECYSTECTOMY  03/17/11  . RE-EXCISION OF BREAST LUMPECTOMY Right 03/31/2018   Procedure: RE-EXCISION OF BREAST LUMPECTOMY;  Surgeon: Rolm Bookbinder, MD;  Location: [place];  Service: General;  Laterality: Right;  . SKIN TAG REMOVAL     brow and lid  . THIGH / KNEE SOFT TISSUE BIOPSY  09/05/2009  . TUBAL LIGATION  1980    I have reviewed the social history and family history with the patient and they are unchanged from previous note.  ALLERGIES:  is allergic to statins; adhesive [tape]; hydrogen peroxide; lidocaine; metrogel [metronidazole]; latex; neomycin-bacitracin zn-polymyx; sulfa antibiotics; and sulfonamide derivatives.  MEDICATIONS:  Current Outpatient Medications  Medication Sig Dispense Refill  . Albuterol Sulfate (PROAIR RESPICLICK) 409 (90 Base) MCG/ACT AEPB Inhale 2 puffs into the lungs every 4 (four) hours as needed. 1 each  0  . anastrozole (ARIMIDEX) 1 MG tablet TAKE 1 TABLET BY MOUTH  DAILY 90 tablet 3  . CALCIUM PO Take 1 tablet by mouth daily. Reported on 01/06/2016    . cetirizine (ZYRTEC) 10 MG tablet Take 10 mg by mouth daily.      . fluticasone furoate-vilanterol (BREO ELLIPTA) 200-25 MCG/INH AEPB Inhale 1 puff into the lungs daily. 30 each 5  . irbesartan (AVAPRO) 300 MG tablet Take 300 mg by mouth daily.     Marland Kitchen levothyroxine (SYNTHROID, LEVOTHROID) 112 MCG tablet Take 1 tablet by mouth daily.    . Multiple Vitamins-Minerals (MULTIVITAMINS THER.  W/MINERALS) TABS Take 1 tablet by mouth daily.    Marland Kitchen nystatin ointment (MYCOSTATIN) Apply 1 application topically 2 (two) times daily. Apply to affected area for up to 7 days. 30 g 0  . spironolactone (ALDACTONE) 50 MG tablet Take 50 mg by mouth daily. Taking 1/2 pill daily    . SYMBICORT 160-4.5 MCG/ACT inhaler Inhale 2 puffs into the lungs 2 (two) times daily.    Marland Kitchen triamcinolone (KENALOG) 0.025 % ointment Apply 1 application topically 2 (two) times daily. Apply to affected area for 7 days. 30 g 0  . Vitamin D, Ergocalciferol, (DRISDOL) 50000 units CAPS capsule Take 1 capsule by mouth once a week.  3  . exemestane (AROMASIN) 25 MG tablet Take 1 tablet (25 mg total) by mouth daily after breakfast. 30 tablet 3   No current facility-administered medications for this visit.    PHYSICAL EXAMINATION: ECOG PERFORMANCE STATUS: 1 - Symptomatic but completely ambulatory  Vitals:   08/14/19 1332  BP: (!) 149/77  Pulse: 96  Resp: 17  Temp: 98.5 F (36.9 C)  SpO2: 98%   Filed Weights   08/14/19 1332  Weight: 226 lb 4.8 oz (102.6 kg)    GENERAL:alert, no distress and comfortable SKIN: skin color, texture, turgor are normal, no rashes or significant lesions EYES: normal, Conjunctiva are pink and non-injected, sclera clear  NECK: supple, thyroid normal size, non-tender, without nodularity LYMPH:  no palpable lymphadenopathy in the cervical, axillary  LUNGS: clear to auscultation and percussion with normal breathing effort HEART: regular rate & rhythm and no murmurs and no lower extremity edema ABDOMEN:abdomen soft, non-tender and normal bowel sounds Musculoskeletal:no cyanosis of digits and no clubbing  NEURO: alert & oriented x 3 with fluent speech, no focal motor/sensory deficits BREAST: S/p right lumpectomy: Surgical incision healed well with scar tissue (+) tenderness of right axilla and left breast (+) tiny smooth nodule of upper inner right arm. (+) Breast lymphedema, R>L. No palpable  mass bilaterally. Breast exam benign.   LABORATORY DATA:  I have reviewed the data as listed CBC Latest Ref Rng & Units 08/14/2019 04/13/2019 10/10/2018  WBC 4.0 - 10.5 K/uL 7.4 5.5 4.8  Hemoglobin 12.0 - 15.0 g/dL 12.0 12.3 12.1  Hematocrit 36.0 - 46.0 % 37.6 39.1 38.6  Platelets 150 - 400 K/uL 175 152 143(L)     CMP Latest Ref Rng & Units 08/14/2019 04/13/2019 10/10/2018  Glucose 70 - 99 mg/dL 131(H) 121(H) 120(H)  BUN 8 - 23 mg/dL '17 11 10  '$ Creatinine 0.44 - 1.00 mg/dL 1.18(H) 1.02(H) 0.97  Sodium 135 - 145 mmol/L 140 140 141  Potassium 3.5 - 5.1 mmol/L 5.0 4.2 4.3  Chloride 98 - 111 mmol/L 107 105 106  CO2 22 - 32 mmol/L '25 28 26  '$ Calcium 8.9 - 10.3 mg/dL 9.3 9.4 8.3(L)  Total Protein 6.5 - 8.1 g/dL 7.9 7.7  7.8  Total Bilirubin 0.3 - 1.2 mg/dL 0.5 0.6 0.7  Alkaline Phos 38 - 126 U/L 61 64 46  AST 15 - 41 U/L '19 25 18  '$ ALT 0 - 44 U/L '20 25 18      '$ RADIOGRAPHIC STUDIES: I have personally reviewed the radiological images as listed and agreed with the findings in the report. No results found.   ASSESSMENT & PLAN:  Lisa Horton is a 74 y.o. female with   1.Malignant neoplasm of upper outer quadrant of right breast, invasive lobular carcinoma, Stage IA, cT1cN0M0,G2,ER (+), PR (+), HER2 negative -She was diagnosed in 01/2018. She is s/p right breast lumpectomy and adjuvant radiation. -Her Oncotype score showed low riskwithrecurrence score17.I do not recommend adjuvantchemotherapy. -She started anti-estrogentherapy with Anastrozole in 07/2018. She stopped anastrozole in 03/2019 due to hot flashes, mood swings, worsening joint pain. Off anastrozole her knee pain has improved. I discussed option of exemestane and reviewed side effects with her. She is interested in trying it. I will call in today (08/14/19).  -She is clinically doing well. Lab reviewed, her CBC and CMP are within normal limits. Her physical exam showed likely benign nodule of right upper inner arm and right  breast lymphedema, will monitor. Her 02/2019 mammogram was unremarkable. There is no clinical concern for recurrence. -Continue surveillance. Next mammogram in 02/2020  -Phone visit in 3 months   2. Osteopenia -Her6/2018DEXA showed Osteopenia (-2.2). Her 02/2019 DEXA shows stable osteopenia at right femur neck (-2.2).  -I also discussed AI can weaken her bone. ContinueCalcium and Vitamin D and weight bearing exercise   3. Knee pain  -She saw orthopedist who gave her Meloxicam. She declined knee injections. Pain much improve.  -She also had LE edema of feet at the time. 04/04/19 doppler was negative for DVT.  -Has improved since stopping anastrozole, will watch on Exemestane   4. Right breast Lymphedema  -She has completed PT post surgery. I encouraged her to continue to massage and exercise  -She has compression sleeve and bras to use.  -She also has right posterior shoulder pain. I encouraged her to continue to massage and exercise   5. Obesity  -I instructed her to watch weight on antiestrogen therapy. She is interested in weight loss  -I offered her [facility] wellness clinic. She wants to dry better diet and exercise on her own first.     PLAN: -I prescribed her exemestane today to start this month -M[facility] in 02/2020 at [facility]  -Phone visit in 3 months    No problem-specific Assessment & Plan notes found for this encounter.   Orders Placed This Encounter  Procedures  . MM DIAG BREAST TOMO BILATERAL    Standing Status:   Future    Standing Expiration Date:   08/13/2020    Scheduling Instructions:     [facility]    Order Specific Question:   Reason for Exam (SYMPTOM  OR DIAGNOSIS REQUIRED)    Answer:   screening    Order Specific Question:   Preferred imaging location?    Answer:   External   All questions were answered. The patient knows to call the clinic with any problems, questions or concerns. No barriers to learning was detected. The total time spent in the  appointment was 30 minutes.     Truitt Merle, MD 08/14/2019   I, Joslyn Devon, am acting as scribe for Truitt Merle, MD.   I have reviewed the above documentation for accuracy and completeness,  and I agree with the above.

## 2019-08-14 ENCOUNTER — Inpatient Hospital Stay: Payer: Medicare Other | Attending: Hematology

## 2019-08-14 ENCOUNTER — Inpatient Hospital Stay (HOSPITAL_BASED_OUTPATIENT_CLINIC_OR_DEPARTMENT_OTHER): Payer: Medicare Other | Admitting: Hematology

## 2019-08-14 ENCOUNTER — Other Ambulatory Visit: Payer: Self-pay

## 2019-08-14 ENCOUNTER — Encounter: Payer: Self-pay | Admitting: Hematology

## 2019-08-14 VITALS — BP 149/77 | HR 96 | Temp 98.5°F | Resp 17 | Ht 62.75 in | Wt 226.3 lb

## 2019-08-14 DIAGNOSIS — Z79811 Long term (current) use of aromatase inhibitors: Secondary | ICD-10-CM | POA: Diagnosis not present

## 2019-08-14 DIAGNOSIS — I890 Lymphedema, not elsewhere classified: Secondary | ICD-10-CM | POA: Insufficient documentation

## 2019-08-14 DIAGNOSIS — Z170 Estrogen receptor positive status [ER+]: Secondary | ICD-10-CM

## 2019-08-14 DIAGNOSIS — M8580 Other specified disorders of bone density and structure, unspecified site: Secondary | ICD-10-CM | POA: Insufficient documentation

## 2019-08-14 DIAGNOSIS — E669 Obesity, unspecified: Secondary | ICD-10-CM | POA: Diagnosis not present

## 2019-08-14 DIAGNOSIS — M1990 Unspecified osteoarthritis, unspecified site: Secondary | ICD-10-CM | POA: Insufficient documentation

## 2019-08-14 DIAGNOSIS — C50411 Malignant neoplasm of upper-outer quadrant of right female breast: Secondary | ICD-10-CM

## 2019-08-14 LAB — CBC WITH DIFFERENTIAL (CANCER CENTER ONLY)
Abs Immature Granulocytes: 0.02 10*3/uL (ref 0.00–0.07)
Basophils Absolute: 0.1 10*3/uL (ref 0.0–0.1)
Basophils Relative: 1 %
Eosinophils Absolute: 0.3 10*3/uL (ref 0.0–0.5)
Eosinophils Relative: 5 %
HCT: 37.6 % (ref 36.0–46.0)
Hemoglobin: 12 g/dL (ref 12.0–15.0)
Immature Granulocytes: 0 %
Lymphocytes Relative: 22 %
Lymphs Abs: 1.6 10*3/uL (ref 0.7–4.0)
MCH: 30.3 pg (ref 26.0–34.0)
MCHC: 31.9 g/dL (ref 30.0–36.0)
MCV: 94.9 fL (ref 80.0–100.0)
Monocytes Absolute: 0.5 10*3/uL (ref 0.1–1.0)
Monocytes Relative: 7 %
Neutro Abs: 4.9 10*3/uL (ref 1.7–7.7)
Neutrophils Relative %: 65 %
Platelet Count: 175 10*3/uL (ref 150–400)
RBC: 3.96 MIL/uL (ref 3.87–5.11)
RDW: 13.4 % (ref 11.5–15.5)
WBC Count: 7.4 10*3/uL (ref 4.0–10.5)
nRBC: 0 % (ref 0.0–0.2)

## 2019-08-14 LAB — CMP (CANCER CENTER ONLY)
ALT: 20 U/L (ref 0–44)
AST: 19 U/L (ref 15–41)
Albumin: 4 g/dL (ref 3.5–5.0)
Alkaline Phosphatase: 61 U/L (ref 38–126)
Anion gap: 8 (ref 5–15)
BUN: 17 mg/dL (ref 8–23)
CO2: 25 mmol/L (ref 22–32)
Calcium: 9.3 mg/dL (ref 8.9–10.3)
Chloride: 107 mmol/L (ref 98–111)
Creatinine: 1.18 mg/dL — ABNORMAL HIGH (ref 0.44–1.00)
GFR, Est AFR Am: 54 mL/min — ABNORMAL LOW (ref >60)
GFR, Estimated: 46 mL/min — ABNORMAL LOW (ref >60)
Glucose, Bld: 131 mg/dL — ABNORMAL HIGH (ref 70–99)
Potassium: 5 mmol/L (ref 3.5–5.1)
Sodium: 140 mmol/L (ref 135–145)
Total Bilirubin: 0.5 mg/dL (ref 0.3–1.2)
Total Protein: 7.9 g/dL (ref 6.5–8.1)

## 2019-08-14 MED ORDER — EXEMESTANE 25 MG PO TABS: 25.0000 mg | ORAL_TABLET | Freq: Every day | ORAL | 3 refills | Status: DC

## 2019-08-15 ENCOUNTER — Telehealth: Payer: Self-pay | Admitting: Hematology

## 2019-08-15 ENCOUNTER — Encounter: Payer: Self-pay | Admitting: Hematology

## 2019-08-15 NOTE — Telephone Encounter (Signed)
Scheduled appt per 1/11 los.  Spoke with pt and she is aware of the appt date and time.

## 2019-08-17 ENCOUNTER — Encounter: Payer: Self-pay | Admitting: Radiation Oncology

## 2019-08-17 ENCOUNTER — Ambulatory Visit
Admission: RE | Admit: 2019-08-17 | Discharge: 2019-08-17 | Disposition: A | Discharge status: Home / Self Care | Payer: Medicare Other | Source: Ambulatory Visit | Attending: Radiation Oncology | Admitting: Radiation Oncology

## 2019-08-17 ENCOUNTER — Other Ambulatory Visit: Payer: Self-pay

## 2019-08-17 VITALS — BP 144/72 | HR 91 | Temp 98.0°F | Resp 20 | Ht 62.75 in | Wt 227.2 lb

## 2019-08-17 DIAGNOSIS — Z79811 Long term (current) use of aromatase inhibitors: Secondary | ICD-10-CM | POA: Diagnosis not present

## 2019-08-17 DIAGNOSIS — Z170 Estrogen receptor positive status [ER+]: Secondary | ICD-10-CM | POA: Insufficient documentation

## 2019-08-17 DIAGNOSIS — C50411 Malignant neoplasm of upper-outer quadrant of right female breast: Secondary | ICD-10-CM | POA: Diagnosis not present

## 2019-08-17 DIAGNOSIS — Z79899 Other long term (current) drug therapy: Secondary | ICD-10-CM | POA: Insufficient documentation

## 2019-08-17 DIAGNOSIS — Z923 Personal history of irradiation: Secondary | ICD-10-CM | POA: Insufficient documentation

## 2019-08-17 DIAGNOSIS — Z08 Encounter for follow-up examination after completed treatment for malignant neoplasm: Secondary | ICD-10-CM | POA: Diagnosis not present

## 2019-08-17 DIAGNOSIS — N6459 Other signs and symptoms in breast: Secondary | ICD-10-CM | POA: Diagnosis not present

## 2019-08-17 NOTE — Patient Instructions (Signed)
Coronavirus (COVID-19) Are you at risk?  Are you at risk for the Coronavirus (COVID-19)?  To be considered HIGH RISK for Coronavirus (COVID-19), you have to meet the following criteria:  . Traveled to [place], [place], [place], [place] or [place]; or in the [place] to [place], [place], [place], or [place]; and have fever, cough, and shortness of breath within the last 2 weeks of travel OR . Been in close contact with a person diagnosed with COVID-19 within the last 2 weeks and have fever, cough, and shortness of breath . IF YOU DO NOT MEET THESE CRITERIA, YOU ARE CONSIDERED LOW RISK FOR COVID-19.  What to do if you are HIGH RISK for COVID-19?  . If you are having a medical emergency, call 911. . Seek medical care right away. Before you go to a doctor's office, urgent care or emergency department, call ahead and tell them about your recent travel, contact with someone diagnosed with COVID-19, and your symptoms. You should receive instructions from your physician's office regarding next steps of care.  . When you arrive at healthcare provider, tell the healthcare staff immediately you have returned from visiting [place], [place], [place], [place] or [place]; or traveled in the [place] to [place], [place], [place], or [place]; in the last two weeks or you have been in close contact with a person diagnosed with COVID-19 in the last 2 weeks.   . Tell the health care staff about your symptoms: fever, cough and shortness of breath. . After you have been seen by a medical provider, you will be either: o Tested for (COVID-19) and discharged home on quarantine except to seek medical care if symptoms worsen, and asked to  - Stay home and avoid contact with others until you get your results (4-5 days)  - Avoid travel on public transportation if possible (such as bus, train, or airplane) or o Sent to the Emergency Department by EMS for evaluation, COVID-19 testing, and possible  admission depending on your condition and test results.  What to do if you are LOW RISK for COVID-19?  Reduce your risk of any infection by using the same precautions used for avoiding the common cold or flu:  . Wash your hands often with soap and warm water for at least 20 seconds.  If soap and water are not readily available, use an alcohol-based hand sanitizer with at least 60% alcohol.  . If coughing or sneezing, cover your mouth and nose by coughing or sneezing into the elbow areas of your shirt or coat, into a tissue or into your sleeve (not your hands). . Avoid shaking hands with others and consider head nods or verbal greetings only. . Avoid touching your eyes, nose, or mouth with unwashed hands.  . Avoid close contact with people who are sick. . Avoid places or events with large numbers of people in one location, like concerts or sporting events. . Carefully consider travel plans you have or are making. . If you are planning any travel outside or inside the [place], visit the CDC's Travelers' Health webpage for the latest health notices. . If you have some symptoms but not all symptoms, continue to monitor at home and seek medical attention if your symptoms worsen. . If you are having a medical emergency, call 911.   ADDITIONAL HEALTHCARE OPTIONS FOR PATIENTS  [facility] / e-Visit: [email or web address]         [facility]: [phone]  [facility]: [phone]                   [facility]: [phone]   

## 2019-08-17 NOTE — Progress Notes (Signed)
Radiation Oncology         [phone] ________________________________  Name: Lisa Horton MRN: [id number]  Date: 08/17/2019  DOB: 05/28/1946  Follow-Up Visit Note  CC: [name], DO  [name], MD    ICD-10-CM   1. Malignant neoplasm of upper-outer quadrant of right breast in female, estrogen receptor positive ([place])  C50.411    Z17.0     Diagnosis:   74 y.o. female with Malignant neoplasm of UOQ right breast, stage IA (pT2, pN0, cM0, G2, ER+, PR+, Her2-)   Interval Since Last Radiation:  1 year, 1 month, 1 week  Radiation treatment dates:   1. 05/09/18-05/27/18, 06/13/18-06/28/18 (patient was on an 18-day break in treatment after developing a seroma and breast infection) 2. 06/29/18-07/07/18  Site/dose:   1. Right breast / 50.4 Gy total in 28 fractions of 1.8 Gy 2. Boost / 2 Gy x 5 fractions for a total of 10 Gy  Narrative:  The patient returns today for routine follow-up. She last saw Dr. Burr Medico on 08/14/2019. She was started on exemestane at that time. She states she is no longer doing physical therapy.  On review of systems, she denies pain. She endorses using body lotion on her breast and wearing a compression bra which has helped her breast lymphedema  ALLERGIES:  is allergic to statins; adhesive [tape]; hydrogen peroxide; lidocaine; metrogel [metronidazole]; latex; neomycin-bacitracin zn-polymyx; sulfa antibiotics; and sulfonamide derivatives.  Meds: Current Outpatient Medications  Medication Sig Dispense Refill  . Albuterol Sulfate (PROAIR RESPICLICK) 858 (90 Base) MCG/ACT AEPB Inhale 2 puffs into the lungs every 4 (four) hours as needed. 1 each 0  . anastrozole (ARIMIDEX) 1 MG tablet TAKE 1 TABLET BY MOUTH  DAILY 90 tablet 3  . CALCIUM PO Take 1 tablet by mouth daily. Reported on 01/06/2016    . cetirizine (ZYRTEC) 10 MG tablet Take 10 mg by mouth daily.      Marland Kitchen exemestane (AROMASIN) 25 MG tablet Take 1 tablet (25 mg total) by mouth daily after breakfast. 30  tablet 3  . fluticasone furoate-vilanterol (BREO ELLIPTA) 200-25 MCG/INH AEPB Inhale 1 puff into the lungs daily. 30 each 5  . irbesartan (AVAPRO) 300 MG tablet Take 300 mg by mouth daily.     Marland Kitchen levothyroxine (SYNTHROID, LEVOTHROID) 112 MCG tablet Take 1 tablet by mouth daily.    . Multiple Vitamins-Minerals (MULTIVITAMINS THER. W/MINERALS) TABS Take 1 tablet by mouth daily.    Marland Kitchen nystatin ointment (MYCOSTATIN) Apply 1 application topically 2 (two) times daily. Apply to affected area for up to 7 days. 30 g 0  . spironolactone (ALDACTONE) 50 MG tablet Take 50 mg by mouth daily. Taking 1/2 pill daily    . SYMBICORT 160-4.5 MCG/ACT inhaler Inhale 2 puffs into the lungs 2 (two) times daily.    Marland Kitchen triamcinolone (KENALOG) 0.025 % ointment Apply 1 application topically 2 (two) times daily. Apply to affected area for 7 days. 30 g 0  . Vitamin D, Ergocalciferol, (DRISDOL) 50000 units CAPS capsule Take 1 capsule by mouth once a week.  3   No current facility-administered medications for this encounter.    Physical Findings: The patient is in no acute distress. Patient is alert and oriented.  height is 5' 2.75" (1.594 m) and weight is 227 lb 4 oz (103.1 kg). Her temporal temperature is 98 F (36.7 C). Her blood pressure is 144/72 (abnormal) and her pulse is 91. Her respiration is 20 and oxygen saturation is 99%.   Lungs  are clear to auscultation bilaterally. Heart has regular rate and rhythm. No palpable cervical, supraclavicular, or axillary adenopathy. Abdomen soft, non-tender, normal bowel sounds.  Breast Exam Left Breast: Large and pendulous without mass, nipple discharge or bleeding. Right Breast: Patient continues to have significant hyperpigmentation changes in the right breast but improved since her last exam she continues to have a lot of edema in the breast, particularly in the lower-inner aspect.  Overall the breast seems less tense and less swelling noted. no nipple discharge or bleeding noted.  No obvious mass in the breast but difficult to evaluate considering the edema.   Lab Findings: Lab Results  Component Value Date   WBC 7.4 08/14/2019   HGB 12.0 08/14/2019   HCT 37.6 08/14/2019   MCV 94.9 08/14/2019   PLT 175 08/14/2019    Radiographic Findings: No results found.  Impression:  Malignant neoplasm of UOQ right breast, stage IA (pT2, pN0, cM0, G2, ER+, PR+, Her2-).   No evidence of recurrence.  Overall breast edema has improved since last evaluation.  She is having less discomfort in addition.  Recommended she continue using the compression bra.  She does report the edema improves with sleeping at night.    Plan:  Routine follow-up in 3 months.   ____________________________________  Blair Promise, PhD, MD  This document serves as a record of services personally performed by Gery Pray, MD. It was created on his behalf by Wilburn Mylar, a trained medical scribe. The creation of this record is based on the scribe's personal observations and the provider's statements to them. This document has been checked and approved by the attending provider.

## 2019-08-17 NOTE — Progress Notes (Signed)
Pt presents today for f/u with Dr. Sondra Come. Pt denies c/o pain. Pt reports using body lotion on breast. Breast is hyperpigmented and edematous. Pt is no longer doing physical therapy. Pt is wearing compression bra.  BP (!) 144/72 (BP Location: Left Arm, Patient Position: Sitting)   Pulse 91   Temp 98 F (36.7 C) (Temporal)   Resp 20   Ht 5' 2.75" (1.594 m)   Wt 227 lb 4 oz (103.1 kg)   LMP 08/03/1984 (Approximate)   SpO2 99%   BMI 40.58 kg/m   Wt Readings from Last 3 Encounters:  08/17/19 227 lb 4 oz (103.1 kg)  08/14/19 226 lb 4.8 oz (102.6 kg)  05/15/19 223 lb 6 oz (101.3 kg)   Loma Sousa, RN BSN

## 2019-08-18 ENCOUNTER — Telehealth: Payer: Self-pay

## 2019-08-18 NOTE — Telephone Encounter (Signed)
Ms. Lisa Horton called stating that the medication Dr. Burr Medico prescribed yesterday is too expensive.  She is requesting another medication.

## 2019-08-20 ENCOUNTER — Other Ambulatory Visit: Payer: Self-pay | Admitting: Hematology

## 2019-08-20 MED ORDER — TAMOXIFEN CITRATE 20 MG PO TABS: 20.0000 mg | ORAL_TABLET | Freq: Every day | ORAL | 3 refills | Status: DC

## 2019-08-20 NOTE — Telephone Encounter (Signed)
I will call in Tamoxifen, which cause less joint pain, but still can cause hot flush, mood swing, metabolic change etc which is similar to anastrozole. Also advise her to remain physically active since Tamoxifen slightly increase risk of blot clots. Thanks   Truitt Merle MD

## 2019-08-23 NOTE — Telephone Encounter (Signed)
I spoke with Lisa Horton today.  She has started the tamoxifen.  I discussed with her the higher risk for blood clots with Tamoxifen.  I told her to remain active with walking and to call if she experiences leg or arm pain, swelling, warmth.  She verbalized understanding.

## 2019-09-03 ENCOUNTER — Ambulatory Visit: Payer: Medicare Other

## 2019-09-09 ENCOUNTER — Ambulatory Visit: Payer: Medicare Other

## 2019-10-16 DIAGNOSIS — C50911 Malignant neoplasm of unspecified site of right female breast: Secondary | ICD-10-CM | POA: Diagnosis not present

## 2019-10-24 DIAGNOSIS — E039 Hypothyroidism, unspecified: Secondary | ICD-10-CM | POA: Diagnosis not present

## 2019-10-24 DIAGNOSIS — E119 Type 2 diabetes mellitus without complications: Secondary | ICD-10-CM | POA: Diagnosis not present

## 2019-10-24 DIAGNOSIS — I10 Essential (primary) hypertension: Secondary | ICD-10-CM | POA: Diagnosis not present

## 2019-10-24 DIAGNOSIS — G729 Myopathy, unspecified: Secondary | ICD-10-CM | POA: Diagnosis not present

## 2019-10-24 DIAGNOSIS — E559 Vitamin D deficiency, unspecified: Secondary | ICD-10-CM | POA: Diagnosis not present

## 2019-10-31 DIAGNOSIS — J309 Allergic rhinitis, unspecified: Secondary | ICD-10-CM | POA: Diagnosis not present

## 2019-10-31 DIAGNOSIS — Z0000 Encounter for general adult medical examination without abnormal findings: Secondary | ICD-10-CM | POA: Diagnosis not present

## 2019-11-09 NOTE — Progress Notes (Signed)
[facility]   Telephone:(336) [phone] Fax:([phone]   Clinic Follow up Note   Patient Care Team: [name], DO as PCP - General (Family Medicine) [name], MD as Consulting Physician (General Surgery) Truitt Merle, MD as Consulting Physician (Hematology) Gery Pray, MD as Consulting Physician (Radiation Oncology) Alla Feeling, NP as Nurse Practitioner (Nurse Practitioner)   I connected with Lisa Horton on 11/13/2019 at 11:20 AM EDT by telephone visit and verified that I am speaking with the correct person using two identifiers.  I discussed the limitations, risks, security and privacy concerns of performing an evaluation and management service by telephone and the availability of in person appointments. I also discussed with the patient that there may be a patient responsible charge related to this service. The patient expressed understanding and agreed to proceed.   Patient's location:  At Cancer center  Provider's location:  My Office   CHIEF COMPLAINT: F/u of right breast cancer  SUMMARY OF ONCOLOGIC HISTORY: Oncology History Overview Note  Cancer Staging Malignant neoplasm of upper-outer quadrant of right breast in female, estrogen receptor positive ([place]) Staging form: Breast, AJCC 8th Edition - Clinical stage from 01/04/2018: Stage IA (cT1c, cN0, cM0, G2, ER+, PR+, HER2-) - Signed by Truitt Merle, MD on 01/11/2018 - Pathologic: Stage IA (pT2, pN0, cM0, G2, ER+, PR+, HER2-) - Signed by Nicholas Lose, MD on 06/21/2018     Malignant neoplasm of upper-outer quadrant of right breast in female, estrogen receptor positive ([place])  12/28/2017 Mammogram   Bilateral diagnostic mammography with tomography and right breast ultrasonography at [facility] [place] on 12/28/2017 showing: The irregular architectural distortion in the right breast upper outer quadrant posterior depth is indeterminate. The architectural distortion in the right breast upper outer quadrant middle depth  is indeterminate.    01/03/2018 Pathology Results   Right needle core biopsy with pathology showing: Breast, right, needle core biopsy, (A) 11 o'clock with invasive mammary carcinoma, grade I-II. Breast, right, needle core biopsy, (B) 11 o'clock with invasive mammary carcinoma, grade I-II. Prognostic indicators significant for: ER, 60% positive with moderate staining intensity and PR, 90% positive with strong staining intensity. Proliferation marker Ki67 at 1%. HER2 negative.   01/04/2018 Cancer Staging   Staging form: Breast, AJCC 8th Edition - Clinical stage from 01/04/2018: Stage IA (cT1c, cN0, cM0, G2, ER+, PR+, HER2-) - Signed by Truitt Merle, MD on 01/11/2018   01/25/2018 Genetic Testing   The Multi-Cancer Panel offered by [facility] includes sequencing and/or deletion duplication testing of the following 83 genes: ALK, APC, ATM, AXIN2,BAP1,  BARD1, BLM, BMPR1A, BRCA1, BRCA2, BRIP1, CASR, CDC73, CDH1, CDK4, CDKN1B, CDKN1C, CDKN2A (p14ARF), CDKN2A (p16INK4a), CEBPA, CHEK2, CTNNA1, DICER1, DIS3L2, EGFR (c.2369C>T, p.Thr790Met variant only), EPCAM (Deletion/duplication testing only), FH, FLCN, GATA2, GPC3, GREM1 (Promoter region deletion/duplication testing only), HOXB13 (c.251G>A, p.Gly84Glu), HRAS, KIT, MAX, MEN1, MET, MITF (c.952G>A, p.Glu318Lys variant only), MLH1, MSH2, MSH3, MSH6, MUTYH, NBN, NF1, NF2, NTHL1, PALB2, PDGFRA, PHOX2B, PMS2, POLD1, POLE, POT1, PRKAR1A, PTCH1, PTEN, RAD50, RAD51C, RAD51D, RB1, RECQL4, RET, RUNX1, SDHAF2, SDHA (sequence changes only), SDHB, SDHC, SDHD, SMAD4, SMARCA4, SMARCB1, SMARCE1, STK11, SUFU, TERC, TERT, TMEM127, TP53, TSC1, TSC2, VHL, WRN and WT1.   Results: No pathogenic variants identified.  A Variant of Uncertain significance in BAP1 was identified c.1066C>T (p.Arg356Trp).  The date of this test report is 01/25/2018.    03/09/2018 Surgery   Right lumpectomy: ILC grade 2, 2 foci, 2 cm, 2.5 cm, superior margin positive, 0/5 lymph nodes negative, ER 60%, PR  90%, Ki-67 1%,  HER-2 negative T2N0 stage Ia   03/09/2018 Oncotype testing   Recurrence score 17 Distant risk of recurrence at 9 years with AI or Tamoxifen alone is 5% There is a <1% benefit of chemotherapy   03/31/2018 Pathology Results   Reexcision: No residual cancer   05/09/2018 - 07/08/2018 Radiation Therapy   Radiation with Dr. Sondra Come 05/09/18- 07/08/18    06/21/2018 Cancer Staging   Staging form: Breast, AJCC 8th Edition - Pathologic: Stage IA (pT2, pN0, cM0, G2, ER+, PR+, HER2-) - Signed by Nicholas Lose, MD on 06/21/2018   07/2018 -  Anti-estrogen oral therapy   Anastrozole '1mg'$  daily starting in 07/2018. Stopped in early 03/2019 due to joint pain. Switched to Tamoxifen in 08/2019     Survivorship   Per Cira Rue, NP       CURRENT THERAPY:  Anastrozole '1mg'$  daily startingin12/2019. Stopped in early 03/2019 due to joint pain. Started Tamoxifen in 08/2019   INTERVAL HISTORY:  Lisa Horton is here for a follow up of right breast cancer. She notes she is doing well. She notes she feels better on Tamoxifen. She denies any new symptoms, vaginal discharge or bleeding. She is remaining physically active.    REVIEW OF SYSTEMS:   Constitutional: Denies fevers, chills or abnormal weight loss Eyes: Denies blurriness of vision Ears, nose, mouth, throat, and face: Denies mucositis or sore throat Respiratory: Denies cough, dyspnea or wheezes Cardiovascular: Denies palpitation, chest discomfort or lower extremity swelling Gastrointestinal:  Denies nausea, heartburn or change in bowel habits Skin: Denies abnormal skin rashes Lymphatics: Denies new lymphadenopathy or easy bruising Neurological:Denies numbness, tingling or new weaknesses Behavioral/Psych: Mood is stable, no new changes  All other systems were reviewed with the patient and are negative.  MEDICAL HISTORY:  Past Medical History:  Diagnosis Date  . Acid reflux   . Allergic rhinitis   . Arthritis   . Asthma   . Breast lump   .  Bruises easily   . Cancer ([place])   . Chicken pox   . Colon polyp   . Family history of breast cancer   . Family history of pancreatic cancer   . Family history of prostate cancer   . Gum disease   . Hypertension   . Hypothyroidism   . Incontinence   . Measles   . Myopathy    arms and legs, statin drug related  . Night sweats   . Nipple discharge   . Pre-diabetes   . Prediabetes 06/2014    SURGICAL HISTORY: Past Surgical History:  Procedure Laterality Date  . ABDOMINAL HYSTERECTOMY  1986   secondary to fibroids  . BREAST LUMPECTOMY WITH RADIOACTIVE SEED AND SENTINEL LYMPH NODE BIOPSY Right 03/09/2018   Procedure: RIGHT BREAST LUMPECTOMY WITH BRACKETED RADIOACTIVE SEED AND SENTINEL LYMPH NODE BIOPSY;  Surgeon: Rolm Bookbinder, MD;  Location: [place];  Service: General;  Laterality: Right;  . BREAST SURGERY  07/09/2008   mass removal  . CHOLECYSTECTOMY  03/17/11  . RE-EXCISION OF BREAST LUMPECTOMY Right 03/31/2018   Procedure: RE-EXCISION OF BREAST LUMPECTOMY;  Surgeon: Rolm Bookbinder, MD;  Location: [place];  Service: General;  Laterality: Right;  . SKIN TAG REMOVAL     brow and lid  . THIGH / KNEE SOFT TISSUE BIOPSY  09/05/2009  . TUBAL LIGATION  1980    I have reviewed the social history and family history with the patient and they are unchanged from previous note.  ALLERGIES:  is allergic to statins; adhesive [tape]; hydrogen peroxide; lidocaine; metrogel [metronidazole]; latex; neomycin-bacitracin zn-polymyx; sulfa antibiotics; and sulfonamide derivatives.  MEDICATIONS:  Current Outpatient Medications  Medication Sig Dispense Refill  . Albuterol Sulfate (PROAIR RESPICLICK) 244 (90 Base) MCG/ACT AEPB Inhale 2 puffs into the lungs every 4 (four) hours as needed. 1 each 0  . CALCIUM PO Take 1 tablet by mouth daily. Reported on 01/06/2016    . cetirizine (ZYRTEC) 10 MG tablet Take 10 mg by mouth daily.      . fluticasone  furoate-vilanterol (BREO ELLIPTA) 200-25 MCG/INH AEPB Inhale 1 puff into the lungs daily. 30 each 5  . irbesartan (AVAPRO) 300 MG tablet Take 300 mg by mouth daily.     Marland Kitchen levothyroxine (SYNTHROID, LEVOTHROID) 112 MCG tablet Take 1 tablet by mouth daily.    . Multiple Vitamins-Minerals (MULTIVITAMINS THER. W/MINERALS) TABS Take 1 tablet by mouth daily.    Marland Kitchen nystatin ointment (MYCOSTATIN) Apply 1 application topically 2 (two) times daily. Apply to affected area for up to 7 days. 30 g 0  . spironolactone (ALDACTONE) 50 MG tablet Take 50 mg by mouth daily. Taking 1/2 pill daily    . SYMBICORT 160-4.5 MCG/ACT inhaler Inhale 2 puffs into the lungs 2 (two) times daily.    . tamoxifen (NOLVADEX) 20 MG tablet Take 1 tablet (20 mg total) by mouth daily. 30 tablet 3  . triamcinolone (KENALOG) 0.025 % ointment Apply 1 application topically 2 (two) times daily. Apply to affected area for 7 days. 30 g 0  . Vitamin D, Ergocalciferol, (DRISDOL) 50000 units CAPS capsule Take 1 capsule by mouth once a week.  3   No current facility-administered medications for this visit.    PHYSICAL EXAMINATION: ECOG PERFORMANCE STATUS: 0 - Asymptomatic  No vitals taken today, Exam not performed today   LABORATORY DATA:  I have reviewed the data as listed CBC Latest Ref Rng & Units 08/14/2019 04/13/2019 10/10/2018  WBC 4.0 - 10.5 K/uL 7.4 5.5 4.8  Hemoglobin 12.0 - 15.0 g/dL 12.0 12.3 12.1  Hematocrit 36.0 - 46.0 % 37.6 39.1 38.6  Platelets 150 - 400 K/uL 175 152 143(L)     CMP Latest Ref Rng & Units 08/14/2019 04/13/2019 10/10/2018  Glucose 70 - 99 mg/dL 131(H) 121(H) 120(H)  BUN 8 - 23 mg/dL _0 Creatinine 0.44 - 1.00 mg/dL 1.18(H) 1.02(H) 0.97  Sodium 135 - 145 mmol/L 140 140 141  Potassium 3.5 - 5.1 mmol/L 5.0 4.2 4.3  Chloride 98 - 111 mmol/L 107 105 106  CO2 22 - 32 mmol/L _1 Calcium 8.9 - 10.3 mg/dL 9.3 9.4 8.3(L)  Total Protein 6.5 - 8.1 g/dL 7.9 7.7 7.8  Total Bilirubin 0.3 - 1.2 mg/dL 0.5 0.6  0.7  Alkaline Phos 38 - 126 U/L 61 64 46  AST 15 - 41 U/L _2 ALT 0 - 44 U/L _3 RADIOGRAPHIC STUDIES: I have personally reviewed the radiological images as listed and agreed with the findings in the report. No results found.   ASSESSMENT & PLAN:  Lisa Horton is a 74 y.o. female with   1.Malignant neoplasm of upper outer quadrant of right breast, invasive lobular carcinoma, Stage IA, pT2N0M0,G2,ER (+), PR (+), HER2 negative -She was diagnosed in 01/2018. She is s/p right breast lumpectomy and adjuvant radiation. -Her Oncotype score showed low riskwithrecurrence score17.I did not recommend adjuvantchemotherapy. -She started anti-estrogentherapy with Anastrozole in 07/2018.She stopped anastrozole  in 03/2019 due to hot flashes, mood swings,worseningjoint pain. She could not afford Exemestane so I switched her to Tamoxifen on 08/14/19.  -She has been tolerating Tamoxifen better, will continue.  -She is clinically doing well with no new issues. Continue surveillance. Next Mammogram in 02/2020.  -F/u in 6 months   2. Osteopenia -Her6/2018DEXA showed Osteopenia (-2.2). Her 02/2019 DEXA shows stable osteopenia at right femur neck (-2.2). Repeat DEXA in 01/2021.  -I also discussed AI can weaken her bone. ContinueCalcium and Vitamin Dandweight bearing exercise   3. Knee pain -She saw orthopedist who gave her Meloxicam. She declined knee injections. Pain much improve.  -She also had LE edema of feet at the time. 04/04/19 doppler was negativefor DVT.  -Has improved since stopping anastrozole, will watch on Tamoxifen.   4. Right breast Lymphedema  -She has completed PT post surgery. I encouraged her to continue to massage and exercise  -She has compression sleeve and bras to use. -She also has right posterior shoulder pain. I encouraged her to continue to massage and exercise   5. Obesity  -I instructed her to watch weight on antiestrogen therapy. She is  interested in weight loss  -I offered her [facility]. She wants to try better diet and exercise on her own first.     PLAN: -Continue Tamoxifen, refilled today  -Mammogram in 02/2020 at [facility] and f/u in 6 months    No problem-specific Assessment & Plan notes found for this encounter.   No orders of the defined types were placed in this encounter.  I discussed the assessment and treatment plan with the patient. The patient was provided an opportunity to ask questions and all were answered. The patient agreed with the plan and demonstrated an understanding of the instructions.  The patient was advised to call back or seek an in-person evaluation if the symptoms worsen or if the condition fails to improve as anticipated.  The total time spent in the appointment was 15 minutes.    Truitt Merle, MD 11/13/2019   I, Joslyn Devon, am acting as scribe for Truitt Merle, MD.   I have reviewed the above documentation for accuracy and completeness, and I agree with the above.

## 2019-11-13 ENCOUNTER — Inpatient Hospital Stay: Payer: Medicare Other | Attending: Hematology | Admitting: Hematology

## 2019-11-13 ENCOUNTER — Encounter: Payer: Self-pay | Admitting: Hematology

## 2019-11-13 ENCOUNTER — Other Ambulatory Visit: Payer: Self-pay

## 2019-11-13 DIAGNOSIS — C50411 Malignant neoplasm of upper-outer quadrant of right female breast: Secondary | ICD-10-CM

## 2019-11-13 DIAGNOSIS — Z170 Estrogen receptor positive status [ER+]: Secondary | ICD-10-CM | POA: Diagnosis not present

## 2019-11-13 MED ORDER — TAMOXIFEN CITRATE 20 MG PO TABS: 20.0000 mg | ORAL_TABLET | Freq: Every day | ORAL | 3 refills | Status: DC

## 2019-11-14 ENCOUNTER — Telehealth: Payer: Self-pay | Admitting: Hematology

## 2019-11-14 NOTE — Telephone Encounter (Signed)
Scheduled appt per 4/12 los.  Printed and mailed appt calendar. 

## 2019-12-05 DIAGNOSIS — Z86010 Personal history of colonic polyps: Secondary | ICD-10-CM | POA: Diagnosis not present

## 2019-12-05 DIAGNOSIS — R140 Abdominal distension (gaseous): Secondary | ICD-10-CM | POA: Diagnosis not present

## 2019-12-05 DIAGNOSIS — K5900 Constipation, unspecified: Secondary | ICD-10-CM | POA: Diagnosis not present

## 2020-01-19 ENCOUNTER — Other Ambulatory Visit: Payer: Self-pay

## 2020-01-19 DIAGNOSIS — C50411 Malignant neoplasm of upper-outer quadrant of right female breast: Secondary | ICD-10-CM

## 2020-01-19 MED ORDER — TAMOXIFEN CITRATE 20 MG PO TABS: 20.0000 mg | ORAL_TABLET | Freq: Every day | ORAL | 3 refills | Status: DC

## 2020-01-25 DIAGNOSIS — R229 Localized swelling, mass and lump, unspecified: Secondary | ICD-10-CM | POA: Diagnosis not present

## 2020-02-14 ENCOUNTER — Telehealth: Payer: Self-pay | Admitting: Radiation Oncology

## 2020-02-14 NOTE — Telephone Encounter (Signed)
6 MONTH FU//rescheduled from 7/15.

## 2020-02-15 ENCOUNTER — Ambulatory Visit: Payer: Medicare Other | Admitting: Radiation Oncology

## 2020-02-20 DIAGNOSIS — R921 Mammographic calcification found on diagnostic imaging of breast: Secondary | ICD-10-CM | POA: Diagnosis not present

## 2020-02-21 DIAGNOSIS — H40013 Open angle with borderline findings, low risk, bilateral: Secondary | ICD-10-CM | POA: Diagnosis not present

## 2020-02-21 NOTE — Progress Notes (Signed)
Radiation Oncology         [phone] ________________________________  Name: Lisa Horton MRN: [id number]  Date: 02/22/2020  DOB: 1946-01-27  Follow-Up Visit Note  CC: Lisa Morning, DO  Lisa Dus, MD    ICD-10-CM   1. Malignant neoplasm of upper-outer quadrant of right breast in female, estrogen receptor positive ([place])  C50.411    Z17.0     Diagnosis: Malignant neoplasm of UOQ right breast, stage IA (pT2, pN0, cM0, G2, ER+, PR+, Her2-)   Interval Since Last Radiation: One year, seven months, three weeks, and five days.  Radiation treatment dates:    1. 05/09/2018 - 05/27/2018, 06/13/2018 - 06/28/2018 (patient was on an 18-day break in treatment after developing a seroma and breast infection) 2. 06/29/2018 - 07/07/2018  Site/dose:    1. Right breast / 50.4 Gy total in 28 fractions of 1.8 Gy 2. Boost / 2 Gy x 5 fractions for a total of 10 Gy  Narrative:  The patient returns today for routine follow-up. She was last seen by Dr. Burr Medico on 11/13/2019, during which time she was noted to be doing well. She continues on antiestrogen oral therapy. Anastrozole was discontinued in August of 2020 due to joint pain, but Tamoxifen was started on 08/20/2019.  On review of systems, she reports occasional discomfort in the right breast but no consistent pain.  She denies any nipple discharge or bleeding from either breast.  She denies any problems with swelling in her right arm or hand.  She does have a lymphedema pump machine at home reports only using this twice a week.. She denies new headaches or new bony pain.Marland Kitchen  ALLERGIES:  is allergic to statins, adhesive [tape], hydrogen peroxide, lidocaine, metrogel [metronidazole], latex, neomycin-bacitracin zn-polymyx, sulfa antibiotics, and sulfonamide derivatives.  Meds: Current Outpatient Medications  Medication Sig Dispense Refill  . Albuterol Sulfate (PROAIR RESPICLICK) 884 (90 Base) MCG/ACT AEPB Inhale 2 puffs into the lungs every 4  (four) hours as needed. 1 each 0  . cetirizine (ZYRTEC) 10 MG tablet Take 10 mg by mouth daily.      . irbesartan (AVAPRO) 300 MG tablet Take 300 mg by mouth daily.     Marland Kitchen levothyroxine (SYNTHROID, LEVOTHROID) 112 MCG tablet Take 1 tablet by mouth daily.    Marland Kitchen nystatin ointment (MYCOSTATIN) Apply 1 application topically 2 (two) times daily. Apply to affected area for up to 7 days. 30 g 0  . spironolactone (ALDACTONE) 50 MG tablet Take 50 mg by mouth daily. Taking 1/2 pill daily    . tamoxifen (NOLVADEX) 20 MG tablet Take 1 tablet (20 mg total) by mouth daily. 90 tablet 3  . Vitamin D, Ergocalciferol, (DRISDOL) 50000 units CAPS capsule Take 1 capsule by mouth once a week.  3  . CALCIUM PO Take 1 tablet by mouth daily. Reported on 01/06/2016 (Patient not taking: Reported on 02/22/2020)    . fluticasone furoate-vilanterol (BREO ELLIPTA) 200-25 MCG/INH AEPB Inhale 1 puff into the lungs daily. (Patient not taking: Reported on 02/22/2020) 30 each 5  . Multiple Vitamins-Minerals (MULTIVITAMINS THER. W/MINERALS) TABS Take 1 tablet by mouth daily. (Patient not taking: Reported on 02/22/2020)    . SYMBICORT 160-4.5 MCG/ACT inhaler Inhale 2 puffs into the lungs 2 (two) times daily. (Patient not taking: Reported on 02/22/2020)    . triamcinolone (KENALOG) 0.025 % ointment Apply 1 application topically 2 (two) times daily. Apply to affected area for 7 days. (Patient not taking: Reported on 02/22/2020) 30 g 0  No current facility-administered medications for this encounter.    Physical Findings: The patient is in no acute distress. Patient is alert and oriented.  height is '5\' 2"'  (1.575 m) and weight is 220 lb (99.8 kg) (abnormal). Her temperature is 98.4 F (36.9 C). Her blood pressure is 95/61 (abnormal) and her pulse is 102. Her respiration is 20 and oxygen saturation is 98%.   Lungs are clear to auscultation bilaterally. Heart has regular rate and rhythm. No palpable cervical, supraclavicular, or axillary  adenopathy. Abdomen soft, non-tender, normal bowel sounds.  Breast Exam Left Breast: Large and pendulous without mass, nipple discharge or bleeding.  Small nodule in the lower inner aspect of the left breast which measures approximately 2 x 3 mm.  The patient reports this area will be removed in the near future at the breast center Right Breast: Hyperpigmentation changes noted.  Overall breast edema has decreased.  The patient is less tender with palpation.  She continues to have a fair amount of edema in the lower inner aspect.  No dominant mass appreciated nipple discharge or bleeding.  Lab Findings: Lab Results  Component Value Date   WBC 7.4 08/14/2019   HGB 12.0 08/14/2019   HCT 37.6 08/14/2019   MCV 94.9 08/14/2019   PLT 175 08/14/2019    Radiographic Findings: No results found.  Impression: Malignant neoplasm of UOQ right breast, stage IA (pT2, pN0, cM0, G2, ER+, PR+, Her2-).   No evidence of recurrence on clinical exam today.  Breast edema is less but still present.  I encouraged her to use her breast pump daily as recommended.  Patient fortunately does not have any consistent pain in her right breast.  She reports having imaging done at the [facility] earlier this week.  We have requested a copy of these reports.  Per discussion with patient there was no signs of recurrence along the right breast.  Plan:  She is scheduled to follow-up with Dr. Burr Medico on 05/15/2020.  As needed follow-up in radiation oncology.  She will see Dr. Donne Hazel in August.  Total time spent in this encounter was 15 minutes which included reviewing the patient's most recent follow-ups, physical examination, and documentation.  ____________________________________  Blair Promise, PhD, MD  This document serves as a record of services personally performed by Gery Pray, MD. It was created on his behalf by Clerance Lav, a trained medical scribe. The creation of this record is based on the scribe's  personal observations and the provider's statements to them. This document has been checked and approved by the attending provider.

## 2020-02-22 ENCOUNTER — Other Ambulatory Visit: Payer: Self-pay

## 2020-02-22 ENCOUNTER — Ambulatory Visit
Admission: RE | Admit: 2020-02-22 | Discharge: 2020-02-22 | Disposition: A | Discharge status: Home / Self Care | Payer: Medicare Other | Source: Ambulatory Visit | Attending: Radiation Oncology | Admitting: Radiation Oncology

## 2020-02-22 ENCOUNTER — Encounter: Payer: Self-pay | Admitting: Radiation Oncology

## 2020-02-22 DIAGNOSIS — M6020 Foreign body granuloma of soft tissue, not elsewhere classified, unspecified site: Secondary | ICD-10-CM | POA: Diagnosis not present

## 2020-02-22 DIAGNOSIS — Z7951 Long term (current) use of inhaled steroids: Secondary | ICD-10-CM | POA: Insufficient documentation

## 2020-02-22 DIAGNOSIS — R609 Edema, unspecified: Secondary | ICD-10-CM | POA: Diagnosis not present

## 2020-02-22 DIAGNOSIS — Z170 Estrogen receptor positive status [ER+]: Secondary | ICD-10-CM

## 2020-02-22 DIAGNOSIS — Z08 Encounter for follow-up examination after completed treatment for malignant neoplasm: Secondary | ICD-10-CM | POA: Diagnosis not present

## 2020-02-22 DIAGNOSIS — K116 Mucocele of salivary gland: Secondary | ICD-10-CM | POA: Diagnosis not present

## 2020-02-22 DIAGNOSIS — Z79899 Other long term (current) drug therapy: Secondary | ICD-10-CM | POA: Insufficient documentation

## 2020-02-22 DIAGNOSIS — K1120 Sialoadenitis, unspecified: Secondary | ICD-10-CM | POA: Diagnosis not present

## 2020-02-22 DIAGNOSIS — C50411 Malignant neoplasm of upper-outer quadrant of right female breast: Secondary | ICD-10-CM | POA: Insufficient documentation

## 2020-02-22 NOTE — Progress Notes (Signed)
Patient here for a f/u visit. She states she has a calcification in her left breast and is having it removed in August. Reports occasional brief electrical sensations in her right breast. Reports discomfort under her right arm and on the inside of he right breast. Denies any other problems. Has occasional fatigue.  BP (!) 95/61 (BP Location: Left Arm, Patient Position: Sitting, Cuff Size: Large)   Pulse 102   Temp 98.4 F (36.9 C)   Resp 20   Ht 5\' 2"  (1.575 m)   Wt (!) 220 lb (99.8 kg)   LMP 08/03/1984 (Approximate)   SpO2 98%   BMI 40.24 kg/m    Wt Readings from Last 3 Encounters:  02/22/20 (!) 220 lb (99.8 kg)  08/17/19 227 lb 4 oz (103.1 kg)  08/14/19 226 lb 4.8 oz (102.6 kg)

## 2020-03-20 ENCOUNTER — Other Ambulatory Visit: Payer: Self-pay | Admitting: Radiology

## 2020-03-20 DIAGNOSIS — R921 Mammographic calcification found on diagnostic imaging of breast: Secondary | ICD-10-CM | POA: Diagnosis not present

## 2020-03-20 DIAGNOSIS — N6012 Diffuse cystic mastopathy of left breast: Secondary | ICD-10-CM | POA: Diagnosis not present

## 2020-03-25 DIAGNOSIS — Z170 Estrogen receptor positive status [ER+]: Secondary | ICD-10-CM | POA: Diagnosis not present

## 2020-03-25 DIAGNOSIS — C50411 Malignant neoplasm of upper-outer quadrant of right female breast: Secondary | ICD-10-CM | POA: Diagnosis not present

## 2020-04-15 ENCOUNTER — Telehealth: Payer: Self-pay

## 2020-04-15 NOTE — Telephone Encounter (Signed)
Lisa Horton called stating her joint pain is getting worse while on the tamoxifen.  She is also having hot flashes.  She wants to switch medications.

## 2020-04-16 ENCOUNTER — Telehealth: Payer: Self-pay

## 2020-04-16 ENCOUNTER — Other Ambulatory Visit: Payer: Self-pay

## 2020-04-16 DIAGNOSIS — Z170 Estrogen receptor positive status [ER+]: Secondary | ICD-10-CM

## 2020-04-16 MED ORDER — EXEMESTANE 25 MG PO TABS: 25.0000 mg | ORAL_TABLET | Freq: Every day | ORAL | 4 refills | Status: DC

## 2020-04-16 NOTE — Telephone Encounter (Signed)
Lisa Horton called stating that she is having increased joint pain while taking the tamoxifen.  She wants to know is there something else she can take instead of the tamoxifen.

## 2020-04-16 NOTE — Telephone Encounter (Signed)
Dr. Burr Medico prescribed exemestane to replace tamoxifen.  I called Lisa Horton and let her know.  She verbalized understanding.

## 2020-04-17 ENCOUNTER — Telehealth: Payer: Self-pay

## 2020-04-17 ENCOUNTER — Encounter: Payer: Self-pay | Admitting: Hematology

## 2020-04-17 NOTE — Progress Notes (Signed)
Received referral for assistance from provider regarding Exemestane.  Research officer, trade [employer] for [employer] Patient assistance. Gave to Tollie Pizza RN both patient portion and provider portions to be completed and faxed back to [facility] along with highlit what needs to be submitted from patient regarding income.

## 2020-04-17 NOTE — Telephone Encounter (Signed)
Lisa Horton left vm stating the new medication (exemestane) is too expensive.  She would like something else called in.

## 2020-04-18 ENCOUNTER — Telehealth: Payer: Self-pay

## 2020-04-18 NOTE — Telephone Encounter (Signed)
[facility] patient assistance program paper filled out.  I left Ms Reinecke a message regarding the program and that we have an application for her but need some income information before we can faxed the application.

## 2020-04-22 ENCOUNTER — Telehealth: Payer: Self-pay | Admitting: Obstetrics & Gynecology

## 2020-04-22 NOTE — Telephone Encounter (Signed)
Spoke with patient. Patient reports vaginal itching and vaginal irritation. She recently had a procedure, had a lump removed from her jaw, currently on ampicillin.   Patient is requesting Rx for yeast. Denies pelvic pain, vag d/c, odor or bleeding. Has not tried anything OTC, states medication is sometimes difficult for her to use.   Advised can try OTC Monistat 1, 3 or 7 day, of symptoms do not resolve, OV needed for further evaluation. Offered OV now vs OTC monistat, patient declined. Advised Dr. [name] Heck will review, our office will return call if any additional recommendations. Patient agreeable.   Last AEX 03/21/19  Routing to provider for final review. Patient is agreeable to disposition. Will close encounter.

## 2020-04-22 NOTE — Telephone Encounter (Signed)
Patient has a yeast infection from antibiotics from a procedure she had done.

## 2020-04-23 ENCOUNTER — Telehealth: Payer: Self-pay

## 2020-04-23 NOTE — Telephone Encounter (Signed)
Lisa Horton left vm stating the she can get letrozole with $0copay from her mail in pharmacy.  She is requesting rx for letrozole.

## 2020-04-25 ENCOUNTER — Telehealth: Payer: Self-pay

## 2020-04-25 ENCOUNTER — Other Ambulatory Visit: Payer: Self-pay

## 2020-04-25 DIAGNOSIS — C50411 Malignant neoplasm of upper-outer quadrant of right female breast: Secondary | ICD-10-CM

## 2020-04-25 DIAGNOSIS — E119 Type 2 diabetes mellitus without complications: Secondary | ICD-10-CM | POA: Diagnosis not present

## 2020-04-25 DIAGNOSIS — I10 Essential (primary) hypertension: Secondary | ICD-10-CM | POA: Diagnosis not present

## 2020-04-25 DIAGNOSIS — E039 Hypothyroidism, unspecified: Secondary | ICD-10-CM | POA: Diagnosis not present

## 2020-04-25 MED ORDER — LETROZOLE 2.5 MG PO TABS: 2.5000 mg | ORAL_TABLET | Freq: Every day | ORAL | 1 refills | Status: DC

## 2020-04-25 NOTE — Telephone Encounter (Signed)
I left vm for Lisa Horton to call me so that I can review new rx for letrozole.

## 2020-05-02 DIAGNOSIS — I10 Essential (primary) hypertension: Secondary | ICD-10-CM | POA: Diagnosis not present

## 2020-05-02 DIAGNOSIS — E039 Hypothyroidism, unspecified: Secondary | ICD-10-CM | POA: Diagnosis not present

## 2020-05-02 DIAGNOSIS — E119 Type 2 diabetes mellitus without complications: Secondary | ICD-10-CM | POA: Diagnosis not present

## 2020-05-02 DIAGNOSIS — Z23 Encounter for immunization: Secondary | ICD-10-CM | POA: Diagnosis not present

## 2020-05-02 DIAGNOSIS — R229 Localized swelling, mass and lump, unspecified: Secondary | ICD-10-CM | POA: Diagnosis not present

## 2020-05-10 DIAGNOSIS — D3709 Neoplasm of uncertain behavior of other specified sites of the oral cavity: Secondary | ICD-10-CM | POA: Diagnosis not present

## 2020-05-10 NOTE — Progress Notes (Signed)
Lisa Horton   [phone] [phone]   Clinic Follow up Note   Patient Care Team: [name], DO as PCP - General (Family Medicine) [name], MD as Consulting Physician (General Surgery) Truitt Merle, MD as Consulting Physician (Hematology) Gery Pray, MD as Consulting Physician (Radiation Oncology) Alla Feeling, NP as Nurse Practitioner (Nurse Practitioner)  Date of Service:  05/15/2020  CHIEF COMPLAINT: F/u of right breast cancer  SUMMARY OF ONCOLOGIC HISTORY: Oncology History Overview Note  Cancer Staging Malignant neoplasm of upper-outer quadrant of right breast in female, estrogen receptor positive ([place]) Staging form: Breast, AJCC 8th Edition - Clinical stage from 01/04/2018: Stage IA (cT1c, cN0, cM0, G2, ER+, PR+, HER2-) - Signed by Truitt Merle, MD on 01/11/2018 - Pathologic: Stage IA (pT2, pN0, cM0, G2, ER+, PR+, HER2-) - Signed by Nicholas Lose, MD on 06/21/2018     Malignant neoplasm of upper-outer quadrant of right breast in female, estrogen receptor positive ([place])  12/28/2017 Mammogram   Bilateral diagnostic mammography with tomography and right breast ultrasonography at [facility] on 12/28/2017 showing: The irregular architectural distortion in the right breast upper outer quadrant posterior depth is indeterminate. The architectural distortion in the right breast upper outer quadrant middle depth is indeterminate.    01/03/2018 Pathology Results   Right needle core biopsy with pathology showing: Breast, right, needle core biopsy, (A) 11 o'clock with invasive mammary carcinoma, grade I-II. Breast, right, needle core biopsy, (B) 11 o'clock with invasive mammary carcinoma, grade I-II. Prognostic indicators significant for: ER, 60% positive with moderate staining intensity and PR, 90% positive with strong staining intensity. Proliferation marker Ki67 at 1%. HER2 negative.   01/04/2018 Cancer Staging   Staging form: Breast, AJCC 8th Edition -  Clinical stage from 01/04/2018: Stage IA (cT1c, cN0, cM0, G2, ER+, PR+, HER2-) - Signed by Truitt Merle, MD on 01/11/2018   01/25/2018 Genetic Testing   The Multi-Cancer Panel offered by [facility] includes sequencing and/or deletion duplication testing of the following 83 genes: ALK, APC, ATM, AXIN2,BAP1,  BARD1, BLM, BMPR1A, BRCA1, BRCA2, BRIP1, CASR, CDC73, CDH1, CDK4, CDKN1B, CDKN1C, CDKN2A (p14ARF), CDKN2A (p16INK4a), CEBPA, CHEK2, CTNNA1, DICER1, DIS3L2, EGFR (c.2369C>T, p.Thr790Met variant only), EPCAM (Deletion/duplication testing only), FH, FLCN, GATA2, GPC3, GREM1 (Promoter region deletion/duplication testing only), HOXB13 (c.251G>A, p.Gly84Glu), HRAS, KIT, MAX, MEN1, MET, MITF (c.952G>A, p.Glu318Lys variant only), MLH1, MSH2, MSH3, MSH6, MUTYH, NBN, NF1, NF2, NTHL1, PALB2, PDGFRA, PHOX2B, PMS2, POLD1, POLE, POT1, PRKAR1A, PTCH1, PTEN, RAD50, RAD51C, RAD51D, RB1, RECQL4, RET, RUNX1, SDHAF2, SDHA (sequence changes only), SDHB, SDHC, SDHD, SMAD4, SMARCA4, SMARCB1, SMARCE1, STK11, SUFU, TERC, TERT, TMEM127, TP53, TSC1, TSC2, VHL, WRN and WT1.   Results: No pathogenic variants identified.  A Variant of Uncertain significance in BAP1 was identified c.1066C>T (p.Arg356Trp).  The date of this test report is 01/25/2018.    03/09/2018 Surgery   Right lumpectomy: ILC grade 2, 2 foci, 2 cm, 2.5 cm, superior margin positive, 0/5 lymph nodes negative, ER 60%, PR 90%, Ki-67 1%, HER-2 negative T2N0 stage Ia   03/09/2018 Oncotype testing   Recurrence score 17 Distant risk of recurrence at 9 years with AI or Tamoxifen alone is 5% There is a <1% benefit of chemotherapy   03/31/2018 Pathology Results   Reexcision: No residual cancer   05/09/2018 - 07/08/2018 Radiation Therapy   Radiation with Dr. Sondra Come 05/09/18- 07/08/18    06/21/2018 Cancer Staging   Staging form: Breast, AJCC 8th Edition - Pathologic: Stage IA (pT2, pN0, cM0, G2, ER+, PR+, HER2-) -  Signed by Nicholas Lose, MD on 06/21/2018   07/2018 -  Anti-estrogen  oral therapy   Anastrozole 56m daily starting in 07/2018. Stopped in early 03/2019 due to joint pain. Switched to Tamoxifen in 08/2019. Switched to Letrozole in 04/2020 due to joint pain and vignal itching -She was not able to try Exemestane due to copay    Survivorship   Per LCira Rue NP       CURRENT THERAPY:  Anastrozole 160mdaily starting in 07/2018. Stopped in early 03/2019 due to joint pain. Switched to Tamoxifen in 08/2019. Switched to Letrozole in 04/2020 due to joint pain and vignal itching  -She was not able to try Exemestane due to copay  INTERVAL HISTORY:  Lisa STARRYs here for a follow up of right breast cancer. She was last seen by me 6 months ago. She presents to the clinic alone. She notes she has left foot pain up to her achilles tendon. She denies injury. This has been going on for the past 6 weeks an persists since switching to letrozole. She has been on letrozole for 2.5 weeks.  She notes her dentist previously saw a lump of cheek 6 weeks ago and biopsy was negative. She had it moved. She plans to return to ENT for f/u and see if lump is completely removed. She is not open to bisphosphonate.     REVIEW OF SYSTEMS:   Constitutional: Denies fevers, chills or abnormal weight loss Eyes: Denies blurriness of vision Ears, nose, mouth, throat, and face: Denies mucositis or sore throat Respiratory: Denies cough, dyspnea or wheezes Cardiovascular: Denies palpitation, chest discomfort or lower extremity swelling Gastrointestinal:  Denies nausea, heartburn or change in bowel habits Skin: Denies abnormal skin rashes MKS: (+) Left foot pain Lymphatics: Denies new lymphadenopathy or easy bruising Neurological:Denies numbness, tingling or new weaknesses Behavioral/Psych: Mood is stable, no new changes  All other systems were reviewed with the patient and are negative.  MEDICAL HISTORY:  Past Medical History:  Diagnosis Date  . Acid reflux   . Allergic rhinitis   .  Arthritis   . Asthma   . Breast lump   . Bruises easily   . Cancer ([place]  . Chicken pox   . Colon polyp   . Family history of breast cancer   . Family history of pancreatic cancer   . Family history of prostate cancer   . Gum disease   . Hypertension   . Hypothyroidism   . Incontinence   . Measles   . Myopathy    arms and legs, statin drug related  . Night sweats   . Nipple discharge   . Pre-diabetes   . Prediabetes 06/2014    SURGICAL HISTORY: Past Surgical History:  Procedure Laterality Date  . ABDOMINAL HYSTERECTOMY  1986   secondary to fibroids  . BREAST LUMPECTOMY WITH RADIOACTIVE SEED AND SENTINEL LYMPH NODE BIOPSY Right 03/09/2018   Procedure: RIGHT BREAST LUMPECTOMY WITH BRACKETED RADIOACTIVE SEED AND SENTINEL LYMPH NODE BIOPSY;  Surgeon: WaRolm BookbinderMD;  [place] [facility] Service: General;  Laterality: Right;  . BREAST SURGERY  07/09/2008   mass removal  . CHOLECYSTECTOMY  03/17/11  . RE-EXCISION OF BREAST LUMPECTOMY Right 03/31/2018   Procedure: RE-EXCISION OF BREAST LUMPECTOMY;  Surgeon: WaRolm BookbinderMD;  [place] [facility] Service: General;  Laterality: Right;  . SKIN TAG REMOVAL     brow and lid  . THIGH / KNEE SOFT TISSUE BIOPSY  09/05/2009  . TUBAL LIGATION  1980    I have reviewed the social history and family history with the patient and they are unchanged from previous note.  ALLERGIES:  is allergic to statins, adhesive [tape], hydrogen peroxide, lidocaine, metrogel [metronidazole], latex, neomycin-bacitracin zn-polymyx, sulfa antibiotics, and sulfonamide derivatives.  MEDICATIONS:  Current Outpatient Medications  Medication Sig Dispense Refill  . Albuterol Sulfate (PROAIR RESPICLICK) 660 (90 Base) MCG/ACT AEPB Inhale 2 puffs into the lungs every 4 (four) hours as needed. 1 each 0  . CALCIUM PO Take 1 tablet by mouth daily. Reported on 01/06/2016 (Patient not taking: Reported on 02/22/2020)    .  cetirizine (ZYRTEC) 10 MG tablet Take 10 mg by mouth daily.      . fluticasone furoate-vilanterol (BREO ELLIPTA) 200-25 MCG/INH AEPB Inhale 1 puff into the lungs daily. (Patient not taking: Reported on 02/22/2020) 30 each 5  . irbesartan (AVAPRO) 300 MG tablet Take 300 mg by mouth daily.     Marland Kitchen letrozole (FEMARA) 2.5 MG tablet TAKE 1 TABLET BY MOUTH  DAILY 60 tablet 5  . levothyroxine (SYNTHROID, LEVOTHROID) 112 MCG tablet Take 1 tablet by mouth daily.    . Multiple Vitamins-Minerals (MULTIVITAMINS THER. W/MINERALS) TABS Take 1 tablet by mouth daily. (Patient not taking: Reported on 02/22/2020)    . nystatin ointment (MYCOSTATIN) Apply 1 application topically 2 (two) times daily. Apply to affected area for up to 7 days. 30 g 0  . spironolactone (ALDACTONE) 50 MG tablet Take 50 mg by mouth daily. Taking 1/2 pill daily    . SYMBICORT 160-4.5 MCG/ACT inhaler Inhale 2 puffs into the lungs 2 (two) times daily. (Patient not taking: Reported on 02/22/2020)    . triamcinolone (KENALOG) 0.025 % ointment Apply 1 application topically 2 (two) times daily. Apply to affected area for 7 days. (Patient not taking: Reported on 02/22/2020) 30 g 0  . Vitamin D, Ergocalciferol, (DRISDOL) 50000 units CAPS capsule Take 1 capsule by mouth once a week.  3   No current facility-administered medications for this visit.    PHYSICAL EXAMINATION: ECOG PERFORMANCE STATUS: 0 - Asymptomatic  Vitals:   05/15/20 1138  BP: 135/69  Pulse: 98  Resp: 20  Temp: 98 F (36.7 C)  SpO2: 100%   Filed Weights   05/15/20 1138  Weight: 221 lb 11.2 oz (100.6 kg)    GENERAL:alert, no distress and comfortable SKIN: skin color, texture, turgor are normal, no rashes or significant lesions EYES: normal, Conjunctiva are pink and non-injected, sclera clear OROPHARYNX:no exudate, no erythema and lips, buccal mucosa, and tongue normal  NECK: supple, thyroid normal size, non-tender, without nodularity LYMPH:  no palpable lymphadenopathy in  the cervical, axillary LUNGS: clear to auscultation and percussion with normal breathing effort HEART: regular rate & rhythm and no murmurs and no lower extremity edema ABDOMEN:abdomen soft, non-tender and normal bowel sounds Musculoskeletal:no cyanosis of digits and no clubbing  NEURO: alert & oriented x 3 with fluent speech, no focal motor/sensory deficits BREAST: S/p right lumpectomy: surgical incision healed well with mild scar tissue in axilla (+) Inner breast tissue thicker and skin hyperpigmentation. Right breast lump at biopsy site, benign.    LABORATORY DATA:  I have reviewed the data as listed CBC Latest Ref Rng & Units 05/15/2020 08/14/2019 04/13/2019  WBC 4.0 - 10.5 K/uL 7.1 7.4 5.5  Hemoglobin 12.0 - 15.0 g/dL 11.6(L) 12.0 12.3  Hematocrit 36 - 46 % 36.7 37.6 39.1  Platelets 150 - 400 K/uL 158 175 152  CMP Latest Ref Rng & Units 05/15/2020 08/14/2019 04/13/2019  Glucose 70 - 99 mg/dL 114(H) 131(H) 121(H)  BUN 8 - 23 mg/dL _0 Creatinine 0.44 - 1.00 mg/dL 1.18(H) 1.18(H) 1.02(H)  Sodium 135 - 145 mmol/L 139 140 140  Potassium 3.5 - 5.1 mmol/L 4.2 5.0 4.2  Chloride 98 - 111 mmol/L 106 107 105  CO2 22 - 32 mmol/L _1 Calcium 8.9 - 10.3 mg/dL 9.3 9.3 9.4  Total Protein 6.5 - 8.1 g/dL 7.9 7.9 7.7  Total Bilirubin 0.3 - 1.2 mg/dL 0.7 0.5 0.6  Alkaline Phos 38 - 126 U/L 50 61 64  AST 15 - 41 U/L _2 ALT 0 - 44 U/L _3 RADIOGRAPHIC STUDIES: I have personally reviewed the radiological images as listed and agreed with the findings in the report. No results found.   ASSESSMENT & PLAN:  EMMAKATE HYPES is a 74 y.o. female with    1.Malignant neoplasm of upper outer quadrant of right breast, invasive lobular carcinoma, Stage IA, pT2N0M0,G2,ER (+), PR (+), HER2 negative -She was diagnosed in 01/2018. She is s/p right breast lumpectomy and adjuvant radiation. -Her Oncotype score showed low riskwithrecurrence score17.I did not recommend  adjuvantchemotherapy. -She started anti-estrogentherapy in 07/2018. She has tried Anastrozole and Tamoxifen but stopped due to AEs. She is currently on Letrozole and tolerating well so far. Will continue to monitor. She was not able to try exemestane due to high copay. -The calcifications of her left breast on 02/2020 mammogram was benign fibrocystic changes on 03/20/20 left breast biopsy.  -From a breast cancer standpoint she is clinically doing well. Labs reviewed, CBC and CMP WNL except 11.6, BG 114, Cr 1.18. Physical exam benign. There is no concern for recurrence.  -Continue surveillance. Next Mammogram in 01/2021.  -Continue Letrozole  -F/u in 4 months    2. Osteopenia -Her6/2018DEXA showed Osteopenia(-2.2). Her7/2020 DEXA showsstableosteopenia at right femur neck (-2.2).Repeat DEXA in 01/2021.  -She did not tolerate Tamoxifen well and is back on AI which can weaken her bone. I discussed the option of bisphosphonate treatment with Zometa or Prolia to strengthen her bones. I reviewed possible side effects and she declined for now.  -ContinueCalcium and Vitamin Dandweight bearing exercise    3. Joint pain -secondary to antiestrogen therapy  -Currently her main pain is in her left foot and achilles tendon. She was recently switched from Tamoxifen to Letrozole. If pain does not improve I suggest she see PCP or orthopedist.   4. Mild Anemia  -She has new onset of mild anemia with Hg 11.6 today (05/15/20).  -I will check her iron panel at next visit.    PLAN: -Continue Letrozole  -pt declined zometa or prolia  -Lab and f/u in 4 months    No problem-specific Assessment & Plan notes found for this encounter.   Orders Placed This Encounter  Procedures  . Iron and TIBC    Standing Status:   Future    Standing Expiration Date:   05/15/2021  . Ferritin    Standing Status:   Future    Standing Expiration Date:   05/15/2021   All questions were answered. The patient  knows to call the clinic with any problems, questions or concerns. No barriers to learning was detected. The total time spent in the appointment was 30 minutes.     Truitt Merle, MD 05/15/2020   I, Joslyn Devon, am acting as scribe  for Truitt Merle, MD.   I have reviewed the above documentation for accuracy and completeness, and I agree with the above.

## 2020-05-13 ENCOUNTER — Other Ambulatory Visit: Payer: Self-pay | Admitting: Hematology

## 2020-05-13 DIAGNOSIS — Z170 Estrogen receptor positive status [ER+]: Secondary | ICD-10-CM

## 2020-05-15 ENCOUNTER — Inpatient Hospital Stay: Payer: Medicare Other

## 2020-05-15 ENCOUNTER — Inpatient Hospital Stay: Payer: Medicare Other | Attending: Hematology | Admitting: Hematology

## 2020-05-15 ENCOUNTER — Other Ambulatory Visit: Payer: Self-pay

## 2020-05-15 ENCOUNTER — Encounter: Payer: Self-pay | Admitting: Hematology

## 2020-05-15 VITALS — BP 135/69 | HR 98 | Temp 98.0°F | Resp 20 | Ht 62.0 in | Wt 221.7 lb

## 2020-05-15 DIAGNOSIS — M8580 Other specified disorders of bone density and structure, unspecified site: Secondary | ICD-10-CM | POA: Insufficient documentation

## 2020-05-15 DIAGNOSIS — C50411 Malignant neoplasm of upper-outer quadrant of right female breast: Secondary | ICD-10-CM

## 2020-05-15 DIAGNOSIS — Z170 Estrogen receptor positive status [ER+]: Secondary | ICD-10-CM | POA: Diagnosis not present

## 2020-05-15 DIAGNOSIS — D649 Anemia, unspecified: Secondary | ICD-10-CM | POA: Insufficient documentation

## 2020-05-15 DIAGNOSIS — Z79811 Long term (current) use of aromatase inhibitors: Secondary | ICD-10-CM | POA: Insufficient documentation

## 2020-05-15 LAB — CBC WITH DIFFERENTIAL (CANCER CENTER ONLY)
Abs Immature Granulocytes: 0.02 10*3/uL (ref 0.00–0.07)
Basophils Absolute: 0 10*3/uL (ref 0.0–0.1)
Basophils Relative: 1 %
Eosinophils Absolute: 0.3 10*3/uL (ref 0.0–0.5)
Eosinophils Relative: 4 %
HCT: 36.7 % (ref 36.0–46.0)
Hemoglobin: 11.6 g/dL — ABNORMAL LOW (ref 12.0–15.0)
Immature Granulocytes: 0 %
Lymphocytes Relative: 22 %
Lymphs Abs: 1.6 10*3/uL (ref 0.7–4.0)
MCH: 29.3 pg (ref 26.0–34.0)
MCHC: 31.6 g/dL (ref 30.0–36.0)
MCV: 92.7 fL (ref 80.0–100.0)
Monocytes Absolute: 0.5 10*3/uL (ref 0.1–1.0)
Monocytes Relative: 7 %
Neutro Abs: 4.7 10*3/uL (ref 1.7–7.7)
Neutrophils Relative %: 66 %
Platelet Count: 158 10*3/uL (ref 150–400)
RBC: 3.96 MIL/uL (ref 3.87–5.11)
RDW: 13.4 % (ref 11.5–15.5)
WBC Count: 7.1 10*3/uL (ref 4.0–10.5)
nRBC: 0 % (ref 0.0–0.2)

## 2020-05-15 LAB — CMP (CANCER CENTER ONLY)
ALT: 19 U/L (ref 0–44)
AST: 26 U/L (ref 15–41)
Albumin: 3.7 g/dL (ref 3.5–5.0)
Alkaline Phosphatase: 50 U/L (ref 38–126)
Anion gap: 3 — ABNORMAL LOW (ref 5–15)
BUN: 13 mg/dL (ref 8–23)
CO2: 30 mmol/L (ref 22–32)
Calcium: 9.3 mg/dL (ref 8.9–10.3)
Chloride: 106 mmol/L (ref 98–111)
Creatinine: 1.18 mg/dL — ABNORMAL HIGH (ref 0.44–1.00)
GFR, Estimated: 46 mL/min — ABNORMAL LOW (ref >60)
Glucose, Bld: 114 mg/dL — ABNORMAL HIGH (ref 70–99)
Potassium: 4.2 mmol/L (ref 3.5–5.1)
Sodium: 139 mmol/L (ref 135–145)
Total Bilirubin: 0.7 mg/dL (ref 0.3–1.2)
Total Protein: 7.9 g/dL (ref 6.5–8.1)

## 2020-05-27 NOTE — Progress Notes (Signed)
74 y.o. Lisa Horton Single Black or African American female here for breast and pelvic exam.  I am also following her for h/o 1A breast cancer.  She is also followed by Dr. Burr Medico.  Last appt with her was 05/15/2020.  Pt's post treatment medication has been changed from anastrozole to tamoxifen and back to anastrozole due to symptoms/side effects.  Has significant vaginal symptoms with tamoxifen.  This has resolved.  Denies vaginal bleeding.    Doing well.  Family is well.    Patient's last menstrual period was 08/03/1984 (approximate).          Sexually active: No.  H/O STD:  no  Health Maintenance: PCP:  [name] Last wellness appt was this year  Did blood work at that appt: yes   Vaccines are up to date:  Had Moderna Covid vaccine, influenza, Pneumovax/prevnar, tdap 2014 Colonoscopy:  9/18 normal f/u 47yr MMG:  02-20-2020, biopsy 03/2020 see 2nd page of mmg report BMD:  02-02-2019 f/u 2 year per Dr. FBurr MedicoLast pap smear:  09-14-16 neg, 03-21-2019 neg H/o abnormal pap smear: yes? But this was a long time ago and pt is not sure.  No hx of treatment.     reports that she quit smoking about 11 years ago. Her smoking use included cigarettes. She has a 48.00 pack-year smoking history. She has never used smokeless tobacco. She reports current alcohol use. She reports that she does not use drugs.  Past Medical History:  Diagnosis Date  . Acid reflux   . Allergic rhinitis   . Arthritis   . Asthma   . Breast lump   . Bruises easily   . Cancer ([place]    rt breast cancer  . Chicken pox   . Colon polyp   . Family history of breast cancer   . Family history of pancreatic cancer   . Family history of prostate cancer   . Gum disease   . Hypertension   . Hypothyroidism   . Incontinence   . Measles   . Myopathy    arms and legs, statin drug related  . Night sweats   . Nipple discharge   . Pre-diabetes   . Prediabetes 06/2014    Past Surgical History:  Procedure Laterality Date  . ABDOMINAL  HYSTERECTOMY  1986   secondary to fibroids  . BREAST LUMPECTOMY WITH RADIOACTIVE SEED AND SENTINEL LYMPH NODE BIOPSY Right 03/09/2018   Procedure: RIGHT BREAST LUMPECTOMY WITH BRACKETED RADIOACTIVE SEED AND SENTINEL LYMPH NODE BIOPSY;  Surgeon: WRolm Bookbinder MD;  [place] [facility]  Service: General;  Laterality: Right;  . BREAST SURGERY  07/09/2008   mass removal  . CHOLECYSTECTOMY  03/17/11  . RE-EXCISION OF BREAST LUMPECTOMY Right 03/31/2018   Procedure: RE-EXCISION OF BREAST LUMPECTOMY;  Surgeon: WRolm Bookbinder MD;  [place] [facility]  Service: General;  Laterality: Right;  . SKIN TAG REMOVAL     brow and lid  . THIGH / KNEE SOFT TISSUE BIOPSY  09/05/2009  . TUBAL LIGATION  1980    Current Outpatient Medications  Medication Sig Dispense Refill  . Albuterol Sulfate (PROAIR RESPICLICK) 1007(90 Base) MCG/ACT AEPB Inhale 2 puffs into the lungs every 4 (four) hours as needed. 1 each 0  . cetirizine (ZYRTEC) 10 MG tablet Take 10 mg by mouth daily.      . fluticasone furoate-vilanterol (BREO ELLIPTA) 200-25 MCG/INH AEPB Inhale 1 puff into the lungs daily. 30 each 5  . irbesartan (AVAPRO) 300  MG tablet Take 300 mg by mouth daily.     Marland Kitchen letrozole (FEMARA) 2.5 MG tablet TAKE 1 TABLET BY MOUTH  DAILY 60 tablet 5  . levothyroxine (SYNTHROID) 125 MCG tablet Take 125 mcg by mouth daily.    Marland Kitchen spironolactone (ALDACTONE) 50 MG tablet Take 50 mg by mouth daily. Taking 1/2 pill daily    . SYMBICORT 160-4.5 MCG/ACT inhaler Inhale 2 puffs into the lungs 2 (two) times daily.     . Vitamin D, Ergocalciferol, (DRISDOL) 50000 units CAPS capsule Take 1 capsule by mouth once a week.  3   No current facility-administered medications for this visit.    Family History  Problem Relation Age of Onset  . Hypertension Mother   . Pancreatic cancer Mother 83  . Hypertension Father   . Prostate cancer Father 50  . Emphysema Father   . Hypertension Sister   . Breast cancer  Sister        dx >50, caught very early  . Hypertension Brother   . Alzheimer's disease Brother   . Hypertension Sister   . Heart failure Brother   . Hypertension Brother   . Breast cancer Daughter 43       BRCA1/2 negative, never had panel testing  . Allergies Sister   . Allergies Brother   . Throat cancer Maternal Uncle     Review of Systems  Constitutional: Negative.   HENT: Negative.   Eyes: Negative.   Respiratory: Negative.   Cardiovascular: Negative.   Gastrointestinal: Negative.   Endocrine: Negative.   Genitourinary:       Vaginal itching  Musculoskeletal: Negative.   Skin: Negative.   Allergic/Immunologic: Negative.   Neurological: Negative.   Hematological: Negative.   Psychiatric/Behavioral: Negative.     Exam:   BP 116/68   Pulse 70   Resp 16   Ht 5' 2.25" (1.581 m)   Wt 220 lb (99.8 kg)   LMP 08/03/1984 (Approximate)   BMI 39.92 kg/m   Height: 5' 2.25" (158.1 cm)  General appearance: alert, cooperative and appears stated age Breasts: well healed right breast scars and axillary scars, stable radiation changes noted; left breast with small scar from recent biopsy, no masses, no LAD, nipple discharge Abdomen: soft, non-tender; bowel sounds normal; no masses,  no organomegaly Lymph nodes: Cervical, supraclavicular, and axillary nodes normal.  No abnormal inguinal nodes palpated Neurologic: Grossly normal  Pelvic: External genitalia:  no lesions              Urethra:  normal appearing urethra with no masses, tenderness or lesions              Bartholins and Skenes: normal                 Vagina: normal appearing vagina with normal color and discharge, no lesions              Cervix: surgically absent              Pap taken: No. Bimanual Exam:  Uterus: surgically absent              Adnexa: no masses/tenderness               Rectovaginal: Confirms               Anus:  normal sphincter tone, no lesions  Chaperone, Olene Floss, CMA, was present for  exam.  A:  Breast and Pelvic exam PMP, no HRT H/o Stage  1A breast cancer (2019), ER/PR +, her 2 neg, s/p lumpectomy x 2 (due to positive margin), s/p radiation, now on Anastrozole Osteopenia Hypertension managed by Dr. Theda Sers H/o TAH due to fibroids  P:         Mammogram, diagnostic, yearly for five years after diagnosis Pap smear not indicated Lab work/vaccine management is with with Dr. Theda Sers Declines Shingrix vaccination.  BMD being done every two years due to anastrozole Colonoscopy 9/18. Follow up 5 years. Return for breast and pelvic exam 1 year  22 minutes of total time was spent for this patient encounter, including preparation, face-to-face counseling with the patient and coordination of care, and documentation of the encounter.

## 2020-05-28 ENCOUNTER — Ambulatory Visit (INDEPENDENT_AMBULATORY_CARE_PROVIDER_SITE_OTHER): Payer: Medicare Other | Admitting: Obstetrics & Gynecology

## 2020-05-28 ENCOUNTER — Encounter: Payer: Self-pay | Admitting: Obstetrics & Gynecology

## 2020-05-28 ENCOUNTER — Other Ambulatory Visit: Payer: Self-pay

## 2020-05-28 VITALS — BP 116/68 | HR 70 | Resp 16 | Ht 62.25 in | Wt 220.0 lb

## 2020-05-28 DIAGNOSIS — Z01411 Encounter for gynecological examination (general) (routine) with abnormal findings: Secondary | ICD-10-CM

## 2020-05-28 DIAGNOSIS — C50411 Malignant neoplasm of upper-outer quadrant of right female breast: Secondary | ICD-10-CM

## 2020-05-28 DIAGNOSIS — Z170 Estrogen receptor positive status [ER+]: Secondary | ICD-10-CM | POA: Diagnosis not present

## 2020-05-28 DIAGNOSIS — Z9289 Personal history of other medical treatment: Secondary | ICD-10-CM

## 2020-06-18 DIAGNOSIS — D3709 Neoplasm of uncertain behavior of other specified sites of the oral cavity: Secondary | ICD-10-CM | POA: Diagnosis not present

## 2020-06-26 DIAGNOSIS — C50911 Malignant neoplasm of unspecified site of right female breast: Secondary | ICD-10-CM | POA: Diagnosis not present

## 2020-07-03 ENCOUNTER — Telehealth: Payer: Self-pay

## 2020-07-03 NOTE — Telephone Encounter (Signed)
Patient complaining of rectal irritation and skin has gotten darker. Thinks she may have hemorrhoids but not sure. Requesting appointment. Appointment made for 07-04-20 8:00am to arrive at 7:45 to register. Patient voiced understanding.

## 2020-07-03 NOTE — Telephone Encounter (Signed)
Patient wants to come in to be seen for hemorrhoids.

## 2020-07-04 ENCOUNTER — Ambulatory Visit (INDEPENDENT_AMBULATORY_CARE_PROVIDER_SITE_OTHER): Payer: Medicare Other | Admitting: Obstetrics and Gynecology

## 2020-07-04 ENCOUNTER — Other Ambulatory Visit: Payer: Self-pay

## 2020-07-04 ENCOUNTER — Encounter: Payer: Self-pay | Admitting: Obstetrics and Gynecology

## 2020-07-04 VITALS — BP 126/72 | HR 111 | Ht 62.25 in | Wt 220.8 lb

## 2020-07-04 DIAGNOSIS — K644 Residual hemorrhoidal skin tags: Secondary | ICD-10-CM | POA: Diagnosis not present

## 2020-07-04 DIAGNOSIS — L309 Dermatitis, unspecified: Secondary | ICD-10-CM | POA: Diagnosis not present

## 2020-07-04 MED ORDER — BETAMETHASONE VALERATE 0.1 % EX OINT: 1.0000 "application " | TOPICAL_OINTMENT | Freq: Two times a day (BID) | CUTANEOUS | 0 refills | Status: DC

## 2020-07-04 NOTE — Patient Instructions (Signed)
Perianal Dermatitis, Adult Perianal dermatitis is inflammation of the skin around the anus. This condition causes patches of red, irritated skin that may be itchy or painful. It can be passed from one person to another (contagious), depending on what caused it. What are the causes? This condition may be caused by:  Contact with an irritant, such as stool, urine, mucus, soap, or sweat.  Bacteria, especially certain types of strep (streptococcal) bacteria.  Hemorrhoids.  Fungus.  Skin conditions, such as psoriasis.  Medical conditions, such as diabetes and Crohn disease.  Certain foods.  An opening between the rectum and the skin around the anus (fistula). What increases the risk? This condition is more likely to develop in:  People who are unable to control when they go to the bathroom (have incontinence).  People who have trouble walking or moving around (mobility problems).  People with thin or fragile skin.  People taking oral antibiotic medicines or steroids. What are the signs or symptoms? Itchy, red patches of skin are the main symptom of this condition. The skin in this area may also be swollen and have:  Cracks or tears (fissures).  Small, raised bumps (papules).  Small, pus-filled blisters (pustules). Other symptoms include:  Pain when passing stools.  Blood in the stool.  Itching around the anus.  Tenderness around the anus.  Holding back stools to avoid pain (constipation). How is this diagnosed? This condition is diagnosed with a medical history and physical exam. The diagnosis is confirmed by testing a sample of the skin for bacteria or fungus. How is this treated? Treatment for this condition depends on the cause. Mild cases may go away without treatment when contact with the irritant is stopped. Treatment for more severe cases of perianal dermatitis may include medicines, such as:  A waterproof barrier cream.  Topical or oral corticosteroids to  ease skin inflammation.  Antihistamines to relieve itching.  Antibiotics to treat a bacterial infection.  Antifungal cream to treat a fungal infection. Severe fungal infections may also be treated with topical steroids and medicine to control itching. Follow these instructions at home:   Take over-the-counter and prescription medicines only as told by your health care provider.  Apply prescribed or suggested creams only as told by your health care provider.  If you were prescribed an antibiotic, take it as told by your health care provider. Do not stop taking the antibiotic even if your condition improves.  Do not scratch the affected area.  Wash your hands carefully with soap and warm water after each time that you use the bathroom. If soap and water are not available, use hand sanitizer. Make sure other people in your household wash their hands frequently as well.  Keep all follow-up visits as told by your health care provider. This is important. How is this prevented?  Avoid contact with any substances that cause perianal inflammation.  Keep the perianal area clean and dry. Contact a health care provider if:  Your symptoms do not improve with treatment.  Your symptoms get worse.  Your symptoms go away and then return.  You are not able to control your bowel movements or you are leaking stool (have fecal incontinence).  You have a fever. Get help right away if:  You frequently pass blood in your stools.  You lose weight and you do not know why.  You have fluid, blood, or pus coming from the rash site. Summary  Perianal dermatitis is inflammation of the skin around the anus. This  condition causes patches of red, irritated skin that may be itchy or painful.  Itchy, red patches of skin are the main symptom of this condition.  Treatment for this condition depends on the cause. Mild cases may go away without treatment when contact with the irritant is stopped. Treatment  for more severe cases of perianal dermatitis may include medicines.  Wash your hands carefully with soap and warm water after each time that you use the bathroom. If soap and water are not available, use hand sanitizer.  Keep the perianal area clean and dry to prevent this condition. This information is not intended to replace advice given to you by your health care provider. Make sure you discuss any questions you have with your health care provider. Document Revised: 07/02/2017 Document Reviewed: 10/01/2016 Elsevier Patient Education  [place].

## 2020-07-04 NOTE — Progress Notes (Signed)
GYNECOLOGY  VISIT   HPI: 74 y.o.   Single Black or African American Not Hispanic or Latino  female   [phone] with Patient's last menstrual period was 08/03/1984 (approximate).   here for rectal irritation. She has a hard, tender, itchy lump at her anus. She has a skin irritation her buttocks. She tried lidocaine, but it made her dizzy. She used the butt paste, helps a little. She was constipated in October, now having 2-3 BM's a day. It can be hard to clean after a BM. No pain with BM. She has GSI, only occasionally.  GYNECOLOGIC HISTORY: Patient's last menstrual period was 08/03/1984 (approximate). Contraception:PMP Menopausal hormone therapy: none         OB History    Gravida  2   Para  2   Term  2   Preterm  0   AB  0   Living  2     SAB  0   TAB  0   Ectopic  0   Multiple  0   Live Births  2              Patient Active Problem List   Diagnosis Date Noted  . Genetic testing 02/04/2018  . Family history of pancreatic cancer   . Family history of prostate cancer   . Family history of breast cancer   . Malignant neoplasm of upper-outer quadrant of right breast in female, estrogen receptor positive ([place]) 01/07/2018  . Moderate persistent asthma without complication 41/32/4401  . Seasonal and perennial allergic rhinitis 04/26/2017  . Allergy 01/28/2017  . Allergic rhinitis 11/28/2015  . Cough 11/28/2015  . Breast pain 08/25/2012  . Symptomatic cholecystitis 02/24/2011  . HYPOTHYROIDISM 10/02/2010  . HYPERTENSION 10/02/2010  . G E R D 10/02/2010  . OSTEOARTHRITIS 10/02/2010    Past Medical History:  Diagnosis Date  . Acid reflux   . Allergic rhinitis   . Arthritis   . Asthma   . Breast lump   . Bruises easily   . Cancer ([place])    rt breast cancer  . Chicken pox   . Colon polyp   . Family history of breast cancer   . Family history of pancreatic cancer   . Family history of prostate cancer   . Gum disease   . Hypertension   . Hypothyroidism    . Incontinence   . Measles   . Myopathy    arms and legs, statin drug related  . Night sweats   . Nipple discharge   . Pre-diabetes   . Prediabetes 06/2014    Past Surgical History:  Procedure Laterality Date  . ABDOMINAL HYSTERECTOMY  1986   secondary to fibroids  . BREAST LUMPECTOMY WITH RADIOACTIVE SEED AND SENTINEL LYMPH NODE BIOPSY Right 03/09/2018   Procedure: RIGHT BREAST LUMPECTOMY WITH BRACKETED RADIOACTIVE SEED AND SENTINEL LYMPH NODE BIOPSY;  Surgeon: Rolm Bookbinder, MD;  Location: [facility];  Service: General;  Laterality: Right;  . BREAST SURGERY  07/09/2008   mass removal  . CHOLECYSTECTOMY  03/17/11  . RE-EXCISION OF BREAST LUMPECTOMY Right 03/31/2018   Procedure: RE-EXCISION OF BREAST LUMPECTOMY;  Surgeon: Rolm Bookbinder, MD;  Location: [facility];  Service: General;  Laterality: Right;  . SKIN TAG REMOVAL     brow and lid  . THIGH / KNEE SOFT TISSUE BIOPSY  09/05/2009  . TUBAL LIGATION  1980    Current Outpatient Medications  Medication Sig Dispense Refill  . Albuterol Sulfate (  PROAIR RESPICLICK) 395 (90 Base) MCG/ACT AEPB Inhale 2 puffs into the lungs every 4 (four) hours as needed. 1 each 0  . cetirizine (ZYRTEC) 10 MG tablet Take 10 mg by mouth daily.      . fluticasone furoate-vilanterol (BREO ELLIPTA) 200-25 MCG/INH AEPB Inhale 1 puff into the lungs daily. 30 each 5  . irbesartan (AVAPRO) 300 MG tablet Take 300 mg by mouth daily.     Marland Kitchen letrozole (FEMARA) 2.5 MG tablet TAKE 1 TABLET BY MOUTH  DAILY 60 tablet 5  . levothyroxine (SYNTHROID) 125 MCG tablet Take 125 mcg by mouth daily.    Marland Kitchen spironolactone (ALDACTONE) 50 MG tablet Take 50 mg by mouth daily. Taking 1/2 pill daily    . SYMBICORT 160-4.5 MCG/ACT inhaler Inhale 2 puffs into the lungs 2 (two) times daily.     . Vitamin D, Ergocalciferol, (DRISDOL) 50000 units CAPS capsule Take 1 capsule by mouth once a week.  3   No current facility-administered medications for this  visit.     ALLERGIES: Statins, Adhesive [tape], Hydrogen peroxide, Lidocaine, Metrogel [metronidazole], Latex, Neomycin-bacitracin zn-polymyx, Sulfa antibiotics, and Sulfonamide derivatives  Family History  Problem Relation Age of Onset  . Hypertension Mother   . Pancreatic cancer Mother 45  . Hypertension Father   . Prostate cancer Father 37  . Emphysema Father   . Hypertension Sister   . Breast cancer Sister        dx >50, caught very early  . Hypertension Brother   . Alzheimer's disease Brother   . Hypertension Sister   . Heart failure Brother   . Hypertension Brother   . Breast cancer Daughter 46       BRCA1/2 negative, never had panel testing  . Allergies Sister   . Allergies Brother   . Throat cancer Maternal Uncle     Social History   Socioeconomic History  . Marital status: Single    Spouse name: Not on file  . Number of children: Not on file  . Years of education: Not on file  . Highest education level: Not on file  Occupational History  . Not on file  Tobacco Use  . Smoking status: Former Smoker    Packs/day: 1.00    Years: 48.00    Pack years: 48.00    Types: Cigarettes    Quit date: 08/03/2008    Years since quitting: 11.9  . Smokeless tobacco: Never Used  Vaping Use  . Vaping Use: Never used  Substance and Sexual Activity  . Alcohol use: Yes    Alcohol/week: 0.0 - 1.0 standard drinks  . Drug use: No  . Sexual activity: Not Currently  Other Topics Concern  . Not on file  Social History Narrative   Single, lives with daughter   2 children   Retired - Glass blower/designer   No recent travel      Financial controller Pulmonary:   Previously worked [employer]. From [place] and has always lived in [place]. No international travel. No pets currently. No bird or mold exposure.    Social Determinants of Health   Financial Resource Strain:   . Difficulty of Paying Living Expenses: Not on file  Food Insecurity:   . Worried About Charity fundraiser in the Last Year:  Not on file  . Ran Out of Food in the Last Year: Not on file  Transportation Needs:   . Lack of Transportation (Medical): Not on file  . Lack of Transportation (Non-Medical): Not on  file  Physical Activity:   . Days of Exercise per Week: Not on file  . Minutes of Exercise per Session: Not on file  Stress:   . Feeling of Stress : Not on file  Social Connections:   . Frequency of Communication with Friends and Family: Not on file  . Frequency of Social Gatherings with Friends and Family: Not on file  . Attends Religious Services: Not on file  . Active Member of Clubs or Organizations: Not on file  . Attends Archivist Meetings: Not on file  . Marital Status: Not on file  Intimate Partner Violence:   . Fear of Current or Ex-Partner: Not on file  . Emotionally Abused: Not on file  . Physically Abused: Not on file  . Sexually Abused: Not on file    Review of Systems  All other systems reviewed and are negative.   PHYSICAL EXAMINATION:    LMP 08/03/1984 (Approximate)     General appearance: alert, cooperative and appears stated age  Pelvic: External genitalia:  no lesions              Urethra:  normal appearing urethra with no masses, tenderness or lesions              Bartholins and Skenes: normal                 Anus:  Anal tags noted, no hemorrhoid or anal fissure noted. There is darkening of the perianal area, in the gluteal fold the skin is thickened and whitening.   Chaperone was present for exam.  ASSESSMENT Perianal dermatitis    PLAN Treat with steroid ointment Discussed vulvar/perianal skin care hand out given Discussed cleaning after BM Use Vaseline as needed    An After Visit Summary was printed and given to the patient.

## 2020-07-25 DIAGNOSIS — E039 Hypothyroidism, unspecified: Secondary | ICD-10-CM | POA: Diagnosis not present

## 2020-07-25 DIAGNOSIS — E119 Type 2 diabetes mellitus without complications: Secondary | ICD-10-CM | POA: Diagnosis not present

## 2020-08-01 DIAGNOSIS — E119 Type 2 diabetes mellitus without complications: Secondary | ICD-10-CM | POA: Diagnosis not present

## 2020-08-01 DIAGNOSIS — E039 Hypothyroidism, unspecified: Secondary | ICD-10-CM | POA: Diagnosis not present

## 2020-08-01 DIAGNOSIS — E7800 Pure hypercholesterolemia, unspecified: Secondary | ICD-10-CM | POA: Diagnosis not present

## 2020-08-27 DIAGNOSIS — H5213 Myopia, bilateral: Secondary | ICD-10-CM | POA: Diagnosis not present

## 2020-08-27 DIAGNOSIS — H40013 Open angle with borderline findings, low risk, bilateral: Secondary | ICD-10-CM | POA: Diagnosis not present

## 2020-08-27 DIAGNOSIS — E119 Type 2 diabetes mellitus without complications: Secondary | ICD-10-CM | POA: Diagnosis not present

## 2020-08-27 DIAGNOSIS — H52223 Regular astigmatism, bilateral: Secondary | ICD-10-CM | POA: Diagnosis not present

## 2020-09-06 NOTE — Progress Notes (Signed)
[place]   Telephone:([phone] [phone]   Clinic Follow up Note   Patient Care Team: [name], DO as PCP - General (Family Medicine) [name], MD as Consulting Physician (General Surgery) Truitt Merle, MD as Consulting Physician (Hematology) Gery Pray, MD as Consulting Physician (Radiation Oncology) Alla Feeling, NP as Nurse Practitioner (Nurse Practitioner)  Date of Service:  09/09/2020  CHIEF COMPLAINT: F/u of right breast cancer  SUMMARY OF ONCOLOGIC HISTORY: Oncology History Overview Note  Cancer Staging Malignant neoplasm of upper-outer quadrant of right breast in female, estrogen receptor positive ([place]) Staging form: Breast, AJCC 8th Edition - Clinical stage from 01/04/2018: Stage IA (cT1c, cN0, cM0, G2, ER+, PR+, HER2-) - Signed by Truitt Merle, MD on 01/11/2018 - Pathologic: Stage IA (pT2, pN0, cM0, G2, ER+, PR+, HER2-) - Signed by Nicholas Lose, MD on 06/21/2018     Malignant neoplasm of upper-outer quadrant of right breast in female, estrogen receptor positive ([place])  12/28/2017 Mammogram   Bilateral diagnostic mammography with tomography and right breast ultrasonography at [facility] on 12/28/2017 showing: The irregular architectural distortion in the right breast upper outer quadrant posterior depth is indeterminate. The architectural distortion in the right breast upper outer quadrant middle depth is indeterminate.    01/03/2018 Pathology Results   Right needle core biopsy with pathology showing: Breast, right, needle core biopsy, (A) 11 o'clock with invasive mammary carcinoma, grade I-II. Breast, right, needle core biopsy, (B) 11 o'clock with invasive mammary carcinoma, grade I-II. Prognostic indicators significant for: ER, 60% positive with moderate staining intensity and PR, 90% positive with strong staining intensity. Proliferation marker Ki67 at 1%. HER2 negative.   01/04/2018 Cancer Staging   Staging form: Breast, AJCC 8th Edition -  Clinical stage from 01/04/2018: Stage IA (cT1c, cN0, cM0, G2, ER+, PR+, HER2-) - Signed by Truitt Merle, MD on 01/11/2018   01/25/2018 Genetic Testing   The Multi-Cancer Panel offered by [facility] includes sequencing and/or deletion duplication testing of the following 83 genes: ALK, APC, ATM, AXIN2,BAP1,  BARD1, BLM, BMPR1A, BRCA1, BRCA2, BRIP1, CASR, CDC73, CDH1, CDK4, CDKN1B, CDKN1C, CDKN2A (p14ARF), CDKN2A (p16INK4a), CEBPA, CHEK2, CTNNA1, DICER1, DIS3L2, EGFR (c.2369C>T, p.Thr790Met variant only), EPCAM (Deletion/duplication testing only), FH, FLCN, GATA2, GPC3, GREM1 (Promoter region deletion/duplication testing only), HOXB13 (c.251G>A, p.Gly84Glu), HRAS, KIT, MAX, MEN1, MET, MITF (c.952G>A, p.Glu318Lys variant only), MLH1, MSH2, MSH3, MSH6, MUTYH, NBN, NF1, NF2, NTHL1, PALB2, PDGFRA, PHOX2B, PMS2, POLD1, POLE, POT1, PRKAR1A, PTCH1, PTEN, RAD50, RAD51C, RAD51D, RB1, RECQL4, RET, RUNX1, SDHAF2, SDHA (sequence changes only), SDHB, SDHC, SDHD, SMAD4, SMARCA4, SMARCB1, SMARCE1, STK11, SUFU, TERC, TERT, TMEM127, TP53, TSC1, TSC2, VHL, WRN and WT1.   Results: No pathogenic variants identified.  A Variant of Uncertain significance in BAP1 was identified c.1066C>T (p.Arg356Trp).  The date of this test report is 01/25/2018.    03/09/2018 Surgery   Right lumpectomy: ILC grade 2, 2 foci, 2 cm, 2.5 cm, superior margin positive, 0/5 lymph nodes negative, ER 60%, PR 90%, Ki-67 1%, HER-2 negative T2N0 stage Ia   03/09/2018 Oncotype testing   Recurrence score 17 Distant risk of recurrence at 9 years with AI or Tamoxifen alone is 5% There is a <1% benefit of chemotherapy   03/31/2018 Pathology Results   Reexcision: No residual cancer   05/09/2018 - 07/08/2018 Radiation Therapy   Radiation with Dr. Sondra Come 05/09/18- 07/08/18    06/21/2018 Cancer Staging   Staging form: Breast, AJCC 8th Edition - Pathologic: Stage IA (pT2, pN0, cM0, G2, ER+, PR+, HER2-) -  Signed by Nicholas Lose, MD on 06/21/2018   07/2018 -  Anti-estrogen  oral therapy   Anastrozole 80m daily starting in 07/2018. Stopped in early 03/2019 due to joint pain. Switched to Tamoxifen in 08/2019. Switched to Letrozole in 04/2020 due to joint pain and vignal itching -She was not able to try Exemestane due to copay    Survivorship   Per LCira Rue NP       CURRENT THERAPY:  Anastrozole 11mdaily startingin12/2019. Stopped in early 03/2019 due to joint pain. Switched to Tamoxifen in 08/2019. Switched to Letrozole in 04/2020 due to joint pain and vignal itching. Switched to Exemestane in 09/2020 due to insomnia.    INTERVAL HISTORY:  Lisa DOCKHAMs here for a follow up of right breast cancer. She was last seen by me 4 months ago. She presents to the clinic alone. She notes she is doing well. She notes she has had sporadic hot flashes which are manageable. She is on Letrozole. She notes pain in her foot and having mild insomnia. She notes she will struggle to fall asleep almost every night and feels it is related to Letrozole and not hot flashes. She notes exemestane was $130 copay. She notes she may be able to get Exemestane free if she has it mailed to her. She notes her daughter will hear her talk, cry and scream in her sleep. She notes in clinic feeling a pulsing lump in her left neck and foot for a few seconds before it went away.     REVIEW OF SYSTEMS:   Constitutional: Denies fevers, chills or abnormal weight loss (+) Hot flashes (+) Insomnia  Eyes: Denies blurriness of vision Ears, nose, mouth, throat, and face: Denies mucositis or sore throat Respiratory: Denies cough, dyspnea or wheezes Cardiovascular: Denies palpitation, chest discomfort or lower extremity swelling Gastrointestinal:  Denies nausea, heartburn or change in bowel habits Skin: Denies abnormal skin rashes Lymphatics: Denies new lymphadenopathy or easy bruising Neurological:Denies numbness, tingling or new weaknesses Behavioral/Psych: Mood is stable, no new changes  All other  systems were reviewed with the patient and are negative.  MEDICAL HISTORY:  Past Medical History:  Diagnosis Date  . Acid reflux   . Allergic rhinitis   . Arthritis   . Asthma   . Breast lump   . Bruises easily   . Cancer ([place]   rt breast cancer  . Chicken pox   . Colon polyp   . Family history of breast cancer   . Family history of pancreatic cancer   . Family history of prostate cancer   . Gum disease   . Hypertension   . Hypothyroidism   . Incontinence   . Measles   . Myopathy    arms and legs, statin drug related  . Night sweats   . Nipple discharge   . Pre-diabetes   . Prediabetes 06/2014    SURGICAL HISTORY: Past Surgical History:  Procedure Laterality Date  . ABDOMINAL HYSTERECTOMY  1986   secondary to fibroids  . BREAST LUMPECTOMY WITH RADIOACTIVE SEED AND SENTINEL LYMPH NODE BIOPSY Right 03/09/2018   Procedure: RIGHT BREAST LUMPECTOMY WITH BRACKETED RADIOACTIVE SEED AND SENTINEL LYMPH NODE BIOPSY;  Surgeon: WaRolm BookbinderMD;  [facility] Service: General;  Laterality: Right;  . BREAST SURGERY  07/09/2008   mass removal  . CHOLECYSTECTOMY  03/17/11  . RE-EXCISION OF BREAST LUMPECTOMY Right 03/31/2018   Procedure: RE-EXCISION OF BREAST LUMPECTOMY;  Surgeon: WaRolm BookbinderMD;  Location: [facility];  Service: General;  Laterality: Right;  . SKIN TAG REMOVAL     brow and lid  . THIGH / KNEE SOFT TISSUE BIOPSY  09/05/2009  . TUBAL LIGATION  1980    I have reviewed the social history and family history with the patient and they are unchanged from previous note.  ALLERGIES:  is allergic to statins, adhesive [tape], hydrogen peroxide, lidocaine, metrogel [metronidazole], latex, neomycin-bacitracin zn-polymyx, sulfa antibiotics, and sulfonamide derivatives.  MEDICATIONS:  Current Outpatient Medications  Medication Sig Dispense Refill  . exemestane (AROMASIN) 25 MG tablet Take 1 tablet (25 mg total) by mouth daily  after breakfast. 90 tablet 1  . Albuterol Sulfate (PROAIR RESPICLICK) 937 (90 Base) MCG/ACT AEPB Inhale 2 puffs into the lungs every 4 (four) hours as needed. 1 each 0  . betamethasone valerate ointment (VALISONE) 0.1 % Apply 1 application topically 2 (two) times daily. Use for up to 2 weeks 30 g 0  . cetirizine (ZYRTEC) 10 MG tablet Take 10 mg by mouth daily.      . fluticasone furoate-vilanterol (BREO ELLIPTA) 200-25 MCG/INH AEPB Inhale 1 puff into the lungs daily. 30 each 5  . irbesartan (AVAPRO) 300 MG tablet Take 300 mg by mouth daily.     Marland Kitchen letrozole (FEMARA) 2.5 MG tablet TAKE 1 TABLET BY MOUTH  DAILY 60 tablet 5  . levothyroxine (SYNTHROID) 125 MCG tablet Take 125 mcg by mouth daily.    Marland Kitchen spironolactone (ALDACTONE) 50 MG tablet Take 50 mg by mouth daily. Taking 1/2 pill daily    . SYMBICORT 160-4.5 MCG/ACT inhaler Inhale 2 puffs into the lungs 2 (two) times daily.     . Vitamin D, Ergocalciferol, (DRISDOL) 50000 units CAPS capsule Take 1 capsule by mouth once a week.  3   No current facility-administered medications for this visit.    PHYSICAL EXAMINATION: ECOG PERFORMANCE STATUS: 0 - Asymptomatic  Vitals:   09/09/20 1405  BP: (!) 148/74  Pulse: 97  Resp: 17  Temp: 97.6 F (36.4 C)  SpO2: 100%   Filed Weights   09/09/20 1405  Weight: 224 lb 14.4 oz (102 kg)    GENERAL:alert, no distress and comfortable SKIN: skin color, texture, turgor are normal, no rashes or significant lesions EYES: normal, Conjunctiva are pink and non-injected, sclera clear  NECK: supple, thyroid normal size, non-tender, without nodularity LYMPH:  no palpable lymphadenopathy in the cervical, axillary  LUNGS: clear to auscultation and percussion with normal breathing effort HEART: regular rate & rhythm and no murmurs and no lower extremity edema ABDOMEN:abdomen soft, non-tender and normal bowel sounds Musculoskeletal:no cyanosis of digits and no clubbing  NEURO: alert & oriented x 3 with fluent  speech, no focal motor/sensory deficits BREAST: s/p right lumpectomy: surgical incision healed well with mild scar tissue at lateral right breast and upper breast skin retraction (+) right breast lymphedema. Left Breast exam benign.   LABORATORY DATA:  I have reviewed the data as listed CBC Latest Ref Rng & Units 09/09/2020 05/15/2020 08/14/2019  WBC 4.0 - 10.5 K/uL 6.8 7.1 7.4  Hemoglobin 12.0 - 15.0 g/dL 11.8(L) 11.6(L) 12.0  Hematocrit 36.0 - 46.0 % 37.7 36.7 37.6  Platelets 150 - 400 K/uL 166 158 175     CMP Latest Ref Rng & Units 09/09/2020 05/15/2020 08/14/2019  Glucose 70 - 99 mg/dL 160(H) 114(H) 131(H)  BUN 8 - 23 mg/dL '13 13 17  ' Creatinine 0.44 - 1.00 mg/dL 1.38(H) 1.18(H) 1.18(H)  Sodium 135 -  145 mmol/L 139 139 140  Potassium 3.5 - 5.1 mmol/L 4.3 4.2 5.0  Chloride 98 - 111 mmol/L 107 106 107  CO2 22 - 32 mmol/L '25 30 25  ' Calcium 8.9 - 10.3 mg/dL 8.9 9.3 9.3  Total Protein 6.5 - 8.1 g/dL 7.9 7.9 7.9  Total Bilirubin 0.3 - 1.2 mg/dL 0.5 0.7 0.5  Alkaline Phos 38 - 126 U/L 59 50 61  AST 15 - 41 U/L '27 26 19  ' ALT 0 - 44 U/L '21 19 20      ' RADIOGRAPHIC STUDIES: I have personally reviewed the radiological images as listed and agreed with the findings in the report. No results found.   ASSESSMENT & PLAN:  AMARRA SAWYER is a 75 y.o. female with    1.Malignant neoplasm of upper outer quadrant of right breast, invasive lobular carcinoma, Stage IA,pT2N0M0,G2,ER (+), PR (+), HER2 negative -She was diagnosed in 01/2018. She is s/p right breast lumpectomy and adjuvant radiation. -Her Oncotype score showed low riskwithrecurrence score17.I didnot recommend adjuvantchemotherapy. -She started anti-estrogentherapy in 07/2018. She has tried Anastrozole and Tamoxifen but stopped due to AEs. She is currently on Letrozole. She notes insomnia which she attributes to letrozole along with bad dreams. Her hot flashes are manageable.  -Given side effects, I discussed trying  Exemestane. She notes she may have $0 copay with mail in pharmacy. She is willing to try. I will call in today.  -She is clinically doing well. Lab reviewed, her CBC and CMP are within normal limits except Hg 11.8. Her physical exam was unremarkable except skin retraction, scar tissue and lymphedema of right breast from surgery. There is no clinical concern for recurrence. -The calcifications of her left breast on 02/2020 mammogram was benign fibrocystic changes on 03/20/20 left breast biopsy. Next Mammogram in 01/2021. -F/u in 6 months    2. Osteopenia -Her6/2018DEXA showed Osteopenia(-2.2). Her7/2020 DEXA showsstableosteopenia at right femur neck (-2.2).Repeat DEXA in 01/2021. -She did not tolerate Tamoxifen well and is back on AI which can weaken her bone. I discussed the option of bisphosphonate treatment with Zometa or Prolia to strengthen her bones. I reviewed possible side effects and she declined for now.  -ContinueCalcium and Vitamin Dandweight bearing exercise    3. Joint pain -secondary to antiestrogen therapy  -Currently her main pain is in her left foot and achilles tendon. She was recently switched from Tamoxifen to Letrozole. If pain does not improve I suggest she see PCP or orthopedist.   4. Mild Anemia  -She has new onset of mild anemia with Hg 11.6 on 05/15/20 -Improved on today's lab, Hg, 11/8 (09/09/20)  5. Insomnia, nightmares  -She attributes her recent insomnia to Letrozole. She also notes having nightmares.  -I discussed trying Exemestane to see if this persists. She is willing to try.    PLAN: -I called in Exemestane for her to switch to.  -Mammogram and DEXA at [facility] in 01/2021.  -Lab and F/u in 6 months    No problem-specific Assessment & Plan notes found for this encounter.   Orders Placed This Encounter  Procedures  . MM DIAG BREAST TOMO BILATERAL    Standing Status:   Future    Standing Expiration Date:   09/09/2021    Scheduling  Instructions:     [facility]    Order Specific Question:   Reason for Exam (SYMPTOM  OR DIAGNOSIS REQUIRED)    Answer:   screening    Order Specific Question:   Preferred imaging location?  Answer:   External  . DG Bone Density    Standing Status:   Future    Standing Expiration Date:   09/09/2021    Scheduling Instructions:     [facility]    Order Specific Question:   Reason for Exam (SYMPTOM  OR DIAGNOSIS REQUIRED)    Answer:   screening    Order Specific Question:   Preferred imaging location?    Answer:   External   All questions were answered. The patient knows to call the clinic with any problems, questions or concerns. No barriers to learning was detected. The total time spent in the appointment was 30 minutes.     Truitt Merle, MD 09/09/2020   I, Joslyn Devon, am acting as scribe for Truitt Merle, MD.   I have reviewed the above documentation for accuracy and completeness, and I agree with the above.

## 2020-09-09 ENCOUNTER — Inpatient Hospital Stay: Payer: Medicare Other | Attending: Hematology

## 2020-09-09 ENCOUNTER — Inpatient Hospital Stay: Payer: Medicare Other | Admitting: Hematology

## 2020-09-09 ENCOUNTER — Telehealth: Payer: Self-pay | Admitting: Hematology

## 2020-09-09 ENCOUNTER — Other Ambulatory Visit: Payer: Self-pay

## 2020-09-09 ENCOUNTER — Encounter: Payer: Self-pay | Admitting: Hematology

## 2020-09-09 VITALS — BP 148/74 | HR 97 | Temp 97.6°F | Resp 17 | Ht 62.25 in | Wt 224.9 lb

## 2020-09-09 DIAGNOSIS — Z170 Estrogen receptor positive status [ER+]: Secondary | ICD-10-CM | POA: Insufficient documentation

## 2020-09-09 DIAGNOSIS — Z79811 Long term (current) use of aromatase inhibitors: Secondary | ICD-10-CM | POA: Insufficient documentation

## 2020-09-09 DIAGNOSIS — Z923 Personal history of irradiation: Secondary | ICD-10-CM | POA: Diagnosis not present

## 2020-09-09 DIAGNOSIS — C50411 Malignant neoplasm of upper-outer quadrant of right female breast: Secondary | ICD-10-CM

## 2020-09-09 DIAGNOSIS — Z79899 Other long term (current) drug therapy: Secondary | ICD-10-CM | POA: Diagnosis not present

## 2020-09-09 DIAGNOSIS — G4700 Insomnia, unspecified: Secondary | ICD-10-CM | POA: Insufficient documentation

## 2020-09-09 DIAGNOSIS — Z9011 Acquired absence of right breast and nipple: Secondary | ICD-10-CM | POA: Diagnosis not present

## 2020-09-09 DIAGNOSIS — Z7951 Long term (current) use of inhaled steroids: Secondary | ICD-10-CM | POA: Diagnosis not present

## 2020-09-09 DIAGNOSIS — D649 Anemia, unspecified: Secondary | ICD-10-CM | POA: Insufficient documentation

## 2020-09-09 DIAGNOSIS — M8580 Other specified disorders of bone density and structure, unspecified site: Secondary | ICD-10-CM | POA: Diagnosis not present

## 2020-09-09 DIAGNOSIS — J45909 Unspecified asthma, uncomplicated: Secondary | ICD-10-CM | POA: Diagnosis not present

## 2020-09-09 DIAGNOSIS — K219 Gastro-esophageal reflux disease without esophagitis: Secondary | ICD-10-CM | POA: Insufficient documentation

## 2020-09-09 LAB — CBC WITH DIFFERENTIAL (CANCER CENTER ONLY)
Abs Immature Granulocytes: 0.02 10*3/uL (ref 0.00–0.07)
Basophils Absolute: 0 10*3/uL (ref 0.0–0.1)
Basophils Relative: 1 %
Eosinophils Absolute: 0.3 10*3/uL (ref 0.0–0.5)
Eosinophils Relative: 5 %
HCT: 37.7 % (ref 36.0–46.0)
Hemoglobin: 11.8 g/dL — ABNORMAL LOW (ref 12.0–15.0)
Immature Granulocytes: 0 %
Lymphocytes Relative: 27 %
Lymphs Abs: 1.8 10*3/uL (ref 0.7–4.0)
MCH: 29.5 pg (ref 26.0–34.0)
MCHC: 31.3 g/dL (ref 30.0–36.0)
MCV: 94.3 fL (ref 80.0–100.0)
Monocytes Absolute: 0.4 10*3/uL (ref 0.1–1.0)
Monocytes Relative: 6 %
Neutro Abs: 4.2 10*3/uL (ref 1.7–7.7)
Neutrophils Relative %: 61 %
Platelet Count: 166 10*3/uL (ref 150–400)
RBC: 4 MIL/uL (ref 3.87–5.11)
RDW: 13.8 % (ref 11.5–15.5)
WBC Count: 6.8 10*3/uL (ref 4.0–10.5)
nRBC: 0 % (ref 0.0–0.2)

## 2020-09-09 LAB — CMP (CANCER CENTER ONLY)
ALT: 21 U/L (ref 0–44)
AST: 27 U/L (ref 15–41)
Albumin: 3.7 g/dL (ref 3.5–5.0)
Alkaline Phosphatase: 59 U/L (ref 38–126)
Anion gap: 7 (ref 5–15)
BUN: 13 mg/dL (ref 8–23)
CO2: 25 mmol/L (ref 22–32)
Calcium: 8.9 mg/dL (ref 8.9–10.3)
Chloride: 107 mmol/L (ref 98–111)
Creatinine: 1.38 mg/dL — ABNORMAL HIGH (ref 0.44–1.00)
GFR, Estimated: 41 mL/min — ABNORMAL LOW (ref >60)
Glucose, Bld: 160 mg/dL — ABNORMAL HIGH (ref 70–99)
Potassium: 4.3 mmol/L (ref 3.5–5.1)
Sodium: 139 mmol/L (ref 135–145)
Total Bilirubin: 0.5 mg/dL (ref 0.3–1.2)
Total Protein: 7.9 g/dL (ref 6.5–8.1)

## 2020-09-09 MED ORDER — EXEMESTANE 25 MG PO TABS: 25.0000 mg | ORAL_TABLET | Freq: Every day | ORAL | 1 refills | Status: DC

## 2020-09-09 NOTE — Telephone Encounter (Signed)
Scheduled appointments per 2/7 los. Spoke to patient who is aware of appointments date and times. Gave patient calendar print out.

## 2020-09-11 ENCOUNTER — Other Ambulatory Visit: Payer: Self-pay

## 2020-09-11 ENCOUNTER — Telehealth: Payer: Self-pay

## 2020-09-11 DIAGNOSIS — Z170 Estrogen receptor positive status [ER+]: Secondary | ICD-10-CM

## 2020-09-11 DIAGNOSIS — C50411 Malignant neoplasm of upper-outer quadrant of right female breast: Secondary | ICD-10-CM

## 2020-09-11 NOTE — Progress Notes (Signed)
Letrozole d/c

## 2020-09-11 NOTE — Telephone Encounter (Signed)
-----   Message from Truitt Merle, MD sent at 09/10/2020 11:40 AM EST ----- Please let pt know her lab results, Cr has been trending up in past few years, she has stage III CKD now, could be related to her HTN. Her midl anemia maybe related to that. Please let her f/u with PCP, thanks   Truitt Merle  09/10/2020

## 2020-09-11 NOTE — Telephone Encounter (Signed)
Message left for return call to review most recent labs   Per MD Please let pt know her lab results, Cr has been trending up in past few years, she has stage III CKD now, could be related to her HTN. Her midl anemia maybe related to that. Please let her f/u with PCP, thanks   awaiting return call

## 2020-09-12 ENCOUNTER — Telehealth: Payer: Self-pay

## 2020-09-12 ENCOUNTER — Other Ambulatory Visit: Payer: Self-pay

## 2020-09-12 MED ORDER — EXEMESTANE 25 MG PO TABS: 25.0000 mg | ORAL_TABLET | Freq: Every day | ORAL | 1 refills | Status: DC

## 2020-09-12 NOTE — Telephone Encounter (Signed)
Lisa Horton called stating that the new medication Dr Burr Medico prescribed is too expensive. Dr Burr Medico notified.

## 2020-09-12 NOTE — Progress Notes (Signed)
Send medication to different pharmacy

## 2020-10-13 DIAGNOSIS — H527 Unspecified disorder of refraction: Secondary | ICD-10-CM | POA: Diagnosis not present

## 2020-10-22 DIAGNOSIS — Z853 Personal history of malignant neoplasm of breast: Secondary | ICD-10-CM | POA: Diagnosis not present

## 2020-10-22 DIAGNOSIS — R921 Mammographic calcification found on diagnostic imaging of breast: Secondary | ICD-10-CM | POA: Diagnosis not present

## 2020-10-22 DIAGNOSIS — Z803 Family history of malignant neoplasm of breast: Secondary | ICD-10-CM | POA: Diagnosis not present

## 2020-10-28 DIAGNOSIS — E119 Type 2 diabetes mellitus without complications: Secondary | ICD-10-CM | POA: Diagnosis not present

## 2020-10-28 DIAGNOSIS — E7800 Pure hypercholesterolemia, unspecified: Secondary | ICD-10-CM | POA: Diagnosis not present

## 2020-10-28 DIAGNOSIS — I10 Essential (primary) hypertension: Secondary | ICD-10-CM | POA: Diagnosis not present

## 2020-10-28 DIAGNOSIS — E039 Hypothyroidism, unspecified: Secondary | ICD-10-CM | POA: Diagnosis not present

## 2020-11-04 DIAGNOSIS — I10 Essential (primary) hypertension: Secondary | ICD-10-CM | POA: Diagnosis not present

## 2020-11-04 DIAGNOSIS — E119 Type 2 diabetes mellitus without complications: Secondary | ICD-10-CM | POA: Diagnosis not present

## 2020-11-04 DIAGNOSIS — E039 Hypothyroidism, unspecified: Secondary | ICD-10-CM | POA: Diagnosis not present

## 2020-11-04 DIAGNOSIS — Z853 Personal history of malignant neoplasm of breast: Secondary | ICD-10-CM | POA: Diagnosis not present

## 2020-11-04 DIAGNOSIS — Z0000 Encounter for general adult medical examination without abnormal findings: Secondary | ICD-10-CM | POA: Diagnosis not present

## 2020-11-13 ENCOUNTER — Other Ambulatory Visit (HOSPITAL_BASED_OUTPATIENT_CLINIC_OR_DEPARTMENT_OTHER): Payer: Self-pay | Admitting: *Deleted

## 2020-11-13 ENCOUNTER — Ambulatory Visit (HOSPITAL_BASED_OUTPATIENT_CLINIC_OR_DEPARTMENT_OTHER): Payer: Medicare Other | Admitting: Obstetrics & Gynecology

## 2020-11-13 ENCOUNTER — Other Ambulatory Visit (HOSPITAL_BASED_OUTPATIENT_CLINIC_OR_DEPARTMENT_OTHER)
Admission: RE | Admit: 2020-11-13 | Discharge: 2020-11-13 | Disposition: A | Discharge status: Home / Self Care | Payer: Medicare Other | Source: Ambulatory Visit | Attending: Obstetrics & Gynecology | Admitting: Obstetrics & Gynecology

## 2020-11-13 ENCOUNTER — Other Ambulatory Visit: Payer: Self-pay

## 2020-11-13 ENCOUNTER — Encounter (HOSPITAL_BASED_OUTPATIENT_CLINIC_OR_DEPARTMENT_OTHER): Payer: Self-pay | Admitting: Obstetrics & Gynecology

## 2020-11-13 VITALS — BP 110/60 | Ht 62.25 in | Wt 218.0 lb

## 2020-11-13 DIAGNOSIS — N8110 Cystocele, unspecified: Secondary | ICD-10-CM

## 2020-11-13 DIAGNOSIS — R102 Pelvic and perineal pain: Secondary | ICD-10-CM | POA: Insufficient documentation

## 2020-11-13 DIAGNOSIS — Z90710 Acquired absence of both cervix and uterus: Secondary | ICD-10-CM | POA: Diagnosis not present

## 2020-11-13 DIAGNOSIS — N952 Postmenopausal atrophic vaginitis: Secondary | ICD-10-CM | POA: Diagnosis not present

## 2020-11-13 MED ORDER — NONFORMULARY OR COMPOUNDED ITEM: 3 refills | Status: DC

## 2020-11-13 NOTE — Progress Notes (Signed)
GYNECOLOGY  VISIT  CC:   Abdominal cramping  HPI: 75 y.o. [id number] Single Black or African American female here for abdominal/pelvic cramping that has been present off and on for about 6 months.  Denies vaginal bleeding.  Pt reports the cramping feels like she is on her menstrual cycle but she has not seen any vaginal bleeding.  Reports she's had some significant constipation last fall when she was taking some calcium.  She was skipping 3 days between bowel movements.  She stopped the calcium and started stool softeners and this helped.  When has constipation, she hurts in her rectum.  She does leak urine if she sneezes but this is not new.  Denies dysuria.  Denies hematuria.    H/o hysterectomy, ovaries remain.  GYNECOLOGIC HISTORY: Patient's last menstrual period was 08/03/1984 (approximate).  Patient Active Problem List   Diagnosis Date Noted  . Genetic testing 02/04/2018  . Family history of pancreatic cancer   . Family history of prostate cancer   . Family history of breast cancer   . Malignant neoplasm of upper-outer quadrant of right breast in female, estrogen receptor positive ([place]) 01/07/2018  . Moderate persistent asthma without complication 65/10/5463  . Seasonal and perennial allergic rhinitis 04/26/2017  . Allergy 01/28/2017  . Allergic rhinitis 11/28/2015  . Cough 11/28/2015  . Breast pain 08/25/2012  . Symptomatic cholecystitis 02/24/2011  . HYPOTHYROIDISM 10/02/2010  . HYPERTENSION 10/02/2010  . G E R D 10/02/2010  . OSTEOARTHRITIS 10/02/2010    Past Medical History:  Diagnosis Date  . Acid reflux   . Allergic rhinitis   . Arthritis   . Asthma   . Breast lump   . Bruises easily   . Cancer ([place])    rt breast cancer  . Chicken pox   . Colon polyp   . Family history of breast cancer   . Family history of pancreatic cancer   . Family history of prostate cancer   . Gum disease   . Hypertension   . Hypothyroidism   . Incontinence   . Measles   . Myopathy     arms and legs, statin drug related  . Night sweats   . Nipple discharge   . Pre-diabetes   . Prediabetes 06/2014    Past Surgical History:  Procedure Laterality Date  . ABDOMINAL HYSTERECTOMY  1986   secondary to fibroids  . BREAST LUMPECTOMY WITH RADIOACTIVE SEED AND SENTINEL LYMPH NODE BIOPSY Right 03/09/2018   Procedure: RIGHT BREAST LUMPECTOMY WITH BRACKETED RADIOACTIVE SEED AND SENTINEL LYMPH NODE BIOPSY;  Surgeon: Rolm Bookbinder, MD;  Location: [facility];  Service: General;  Laterality: Right;  . BREAST SURGERY  07/09/2008   mass removal  . CHOLECYSTECTOMY  03/17/11  . RE-EXCISION OF BREAST LUMPECTOMY Right 03/31/2018   Procedure: RE-EXCISION OF BREAST LUMPECTOMY;  Surgeon: Rolm Bookbinder, MD;  Location: [facility];  Service: General;  Laterality: Right;  . SKIN TAG REMOVAL     brow and lid  . THIGH / KNEE SOFT TISSUE BIOPSY  09/05/2009  . TUBAL LIGATION  1980    MEDS:   Current Outpatient Medications on File Prior to Visit  Medication Sig Dispense Refill  . Albuterol Sulfate (PROAIR RESPICLICK) 681 (90 Base) MCG/ACT AEPB Inhale 2 puffs into the lungs every 4 (four) hours as needed. 1 each 0  . betamethasone valerate ointment (VALISONE) 0.1 % Apply 1 application topically 2 (two) times daily. Use for up to 2 weeks 30 g 0  .  cetirizine (ZYRTEC) 10 MG tablet Take 10 mg by mouth daily.    Marland Kitchen exemestane (AROMASIN) 25 MG tablet Take 1 tablet (25 mg total) by mouth daily after breakfast. 90 tablet 1  . fluticasone furoate-vilanterol (BREO ELLIPTA) 200-25 MCG/INH AEPB Inhale 1 puff into the lungs daily. 30 each 5  . irbesartan (AVAPRO) 300 MG tablet Take 300 mg by mouth daily.     Marland Kitchen levothyroxine (SYNTHROID) 125 MCG tablet Take 125 mcg by mouth daily.    . Semaglutide (OZEMPIC, 0.25 OR 0.5 MG/DOSE, [place]) Inject into the skin.    Marland Kitchen spironolactone (ALDACTONE) 50 MG tablet Take 50 mg by mouth daily. Taking 1/2 pill daily    . SYMBICORT 160-4.5 MCG/ACT  inhaler Inhale 2 puffs into the lungs 2 (two) times daily.     . Vitamin D, Ergocalciferol, (DRISDOL) 50000 units CAPS capsule Take 1 capsule by mouth once a week.  3   No current facility-administered medications on file prior to visit.    ALLERGIES: Statins, Adhesive [tape], Hydrogen peroxide, Lidocaine, Metrogel [metronidazole], Latex, Neomycin-bacitracin zn-polymyx, Sulfa antibiotics, and Sulfonamide derivatives  Family History  Problem Relation Age of Onset  . Hypertension Mother   . Pancreatic cancer Mother 20  . Hypertension Father   . Prostate cancer Father 71  . Emphysema Father   . Hypertension Sister   . Breast cancer Sister        dx >50, caught very early  . Hypertension Brother   . Alzheimer's disease Brother   . Hypertension Sister   . Heart failure Brother   . Hypertension Brother   . Breast cancer Daughter 36       BRCA1/2 negative, never had panel testing  . Allergies Sister   . Allergies Brother   . Throat cancer Maternal Uncle     SH:  Single, non smoker  Review of Systems  Gastrointestinal: Positive for constipation.  Genitourinary: Positive for pelvic pain. Negative for dysuria, hematuria and urgency.  Musculoskeletal: Positive for back pain.    PHYSICAL EXAMINATION:    BP 110/60   Ht 5' 2.25" (1.581 m)   Wt 218 lb (98.9 kg)   LMP 08/03/1984 (Approximate)   BMI 39.55 kg/m     General appearance: alert, cooperative and appears stated age Abdomen: soft, non-tender; bowel sounds normal; no masses,  no organomegaly Lymph:  no inguinal LAD noted  Pelvic: External genitalia:  no lesions              Urethra:  normal appearing urethra with no masses, tenderness or lesions              Bartholins and Skenes: normal                 Vagina: atrophic vaginal tissue, no discharge, no lesions              Cervix: absent              Bimanual Exam:  Uterus:  uterus absent              Adnexa: no mass, fullness, tenderness  Chaperone, Britt Bottom,  CMA, was present for exam.  Assessment/Plan: 1. Pelvic pain - Urine Culture; Future - US PELVIC COMPLETE WITH TRANSVAGINAL; Future  2. Female cystocele - do not feel this should cause symptoms pt is experiencing  3. H/O abdominal hysterectomy  4. Vaginal atrophy - NONFORMULARY OR COMPOUNDED ITEM; Vitamin E vaginal suppositories 200u/ml.  One pv three times weekly.  Dispense:  36 each; Refill: 3

## 2020-11-15 MED ORDER — NITROFURANTOIN MONOHYD MACRO 100 MG PO CAPS: 100.0000 mg | ORAL_CAPSULE | Freq: Two times a day (BID) | ORAL | 0 refills | Status: AC

## 2020-11-16 LAB — URINE CULTURE: Culture: >=100000 — AB

## 2020-11-18 ENCOUNTER — Encounter (HOSPITAL_BASED_OUTPATIENT_CLINIC_OR_DEPARTMENT_OTHER): Payer: Self-pay | Admitting: Obstetrics & Gynecology

## 2020-11-18 ENCOUNTER — Other Ambulatory Visit: Payer: Self-pay

## 2020-11-18 ENCOUNTER — Ambulatory Visit (HOSPITAL_BASED_OUTPATIENT_CLINIC_OR_DEPARTMENT_OTHER)
Admission: RE | Admit: 2020-11-18 | Discharge: 2020-11-18 | Disposition: A | Discharge status: Home / Self Care | Payer: Medicare Other | Source: Ambulatory Visit | Attending: Obstetrics & Gynecology | Admitting: Obstetrics & Gynecology

## 2020-11-18 ENCOUNTER — Ambulatory Visit (INDEPENDENT_AMBULATORY_CARE_PROVIDER_SITE_OTHER): Payer: Medicare Other | Admitting: Obstetrics & Gynecology

## 2020-11-18 ENCOUNTER — Other Ambulatory Visit (HOSPITAL_BASED_OUTPATIENT_CLINIC_OR_DEPARTMENT_OTHER): Payer: Self-pay

## 2020-11-18 VITALS — BP 134/57 | HR 101 | Ht 62.0 in | Wt 220.0 lb

## 2020-11-18 DIAGNOSIS — Z90710 Acquired absence of both cervix and uterus: Secondary | ICD-10-CM | POA: Diagnosis not present

## 2020-11-18 DIAGNOSIS — R102 Pelvic and perineal pain: Secondary | ICD-10-CM

## 2020-11-18 DIAGNOSIS — N3090 Cystitis, unspecified without hematuria: Secondary | ICD-10-CM | POA: Diagnosis not present

## 2020-11-18 DIAGNOSIS — R109 Unspecified abdominal pain: Secondary | ICD-10-CM | POA: Diagnosis not present

## 2020-11-18 MED ORDER — AMOXICILLIN-POT CLAVULANATE 875-125 MG PO TABS: 1.0000 | ORAL_TABLET | Freq: Two times a day (BID) | ORAL | 0 refills | Status: DC

## 2020-11-19 ENCOUNTER — Encounter (HOSPITAL_BASED_OUTPATIENT_CLINIC_OR_DEPARTMENT_OTHER): Payer: Self-pay | Admitting: Obstetrics & Gynecology

## 2020-11-19 DIAGNOSIS — E7800 Pure hypercholesterolemia, unspecified: Secondary | ICD-10-CM | POA: Insufficient documentation

## 2020-11-19 DIAGNOSIS — E669 Obesity, unspecified: Secondary | ICD-10-CM | POA: Insufficient documentation

## 2020-11-19 DIAGNOSIS — M8580 Other specified disorders of bone density and structure, unspecified site: Secondary | ICD-10-CM | POA: Insufficient documentation

## 2020-11-19 DIAGNOSIS — E119 Type 2 diabetes mellitus without complications: Secondary | ICD-10-CM | POA: Insufficient documentation

## 2020-11-19 DIAGNOSIS — Z853 Personal history of malignant neoplasm of breast: Secondary | ICD-10-CM | POA: Insufficient documentation

## 2020-11-19 NOTE — Progress Notes (Signed)
75 y.o. [id number] Single Black or African American female here to review recent urine test results and to review ultrasound done due to pelvic pain.  Pt's urine test showed positive culture.  She was started on macrobid but sensitivities show intermediate sensitivities so additional optoins for antibiotic selection discussed.  Pt is allergic to sulfa medications.  Will use augmenting 875mg  bid x 5 days and plan repeat urine culture.  She will be placed on the lab schedule as well.  Ultrasound today did not show clear ovaries on exam but no masses, cysts or other abnormal findings were noted.  Uterus is surgically absent.  Images reviewed with pt personally as final result was not available however official impression is as follows.  IMPRESSION: Surgical absence of uterus with nonvisualization of ovaries as above.  No pelvic sonographic abnormalities identified.  Patient's last menstrual period was 08/03/1984 (approximate).  Past Medical History:  Diagnosis Date  . Acid reflux   . Allergic rhinitis   . Arthritis   . Asthma   . Bruises easily   . Cancer ([place])    rt breast cancer  . Chicken pox   . Colon polyp   . Family history of breast cancer   . Family history of pancreatic cancer   . Family history of prostate cancer   . Gum disease   . Hypertension   . Hypothyroidism   . Incontinence   . Measles   . Myopathy    arms and legs, statin drug related  . Prediabetes 06/2014   Current Outpatient Medications on File Prior to Visit  Medication Sig Dispense Refill  . Albuterol Sulfate (PROAIR RESPICLICK) 371 (90 Base) MCG/ACT AEPB Inhale 2 puffs into the lungs every 4 (four) hours as needed. 1 each 0  . betamethasone valerate ointment (VALISONE) 0.1 % Apply 1 application topically 2 (two) times daily. Use for up to 2 weeks 30 g 0  . cetirizine (ZYRTEC) 10 MG tablet Take 10 mg by mouth daily.    Marland Kitchen exemestane (AROMASIN) 25 MG tablet Take 1 tablet (25 mg total) by mouth daily after  breakfast. 90 tablet 1  . fluticasone furoate-vilanterol (BREO ELLIPTA) 200-25 MCG/INH AEPB Inhale 1 puff into the lungs daily. 30 each 5  . irbesartan (AVAPRO) 300 MG tablet Take 300 mg by mouth daily.     Marland Kitchen levothyroxine (SYNTHROID) 125 MCG tablet Take 125 mcg by mouth daily.    . nitrofurantoin, macrocrystal-monohydrate, (MACROBID) 100 MG capsule Take 1 capsule (100 mg total) by mouth 2 (two) times daily for 5 days. 10 capsule 0  . NONFORMULARY OR COMPOUNDED ITEM Vitamin E vaginal suppositories 200u/ml.  One pv three times weekly. 36 each 3  . Semaglutide (OZEMPIC, 0.25 OR 0.5 MG/DOSE, [place]) Inject into the skin.    Marland Kitchen spironolactone (ALDACTONE) 50 MG tablet Take 50 mg by mouth daily. Taking 1/2 pill daily    . SYMBICORT 160-4.5 MCG/ACT inhaler Inhale 2 puffs into the lungs 2 (two) times daily.     . Vitamin D, Ergocalciferol, (DRISDOL) 50000 units CAPS capsule Take 1 capsule by mouth once a week.  3   No current facility-administered medications on file prior to visit.   Allergies  Allergen Reactions  . Statins Other (See Comments)    Medication Crestor:   Urine became very dark within two days.   Nerve damage and myopathy as a result.   . Adhesive [Tape]   . Hydrogen Peroxide Other (See Comments)    Blisters   .  Lidocaine Other (See Comments)    Combination of Lidocaine and Epinephrine during dental procedure caused her to "nearly arrest".   . Metrogel [Metronidazole] Other (See Comments)    Increase in vaginal burning.  Does better with Flagyl  . Latex Rash  . Neomycin-Bacitracin Zn-Polymyx Rash  . Sulfa Antibiotics Rash   Vitals:   11/18/20 1359  BP: (!) 134/57  Pulse: (!) 101   Physical Exam Constitutional:      Appearance: Normal appearance. She is obese.  Pulmonary:     Effort: Pulmonary effort is normal.  Neurological:     General: No focal deficit present.     Mental Status: She is alert.  Psychiatric:        Mood and Affect: Mood normal.     Assessment/Plan: 1. Cystitis - amoxicillin-clavulanate (AUGMENTIN) 875-125 MG tablet; Take 1 tablet by mouth 2 (two) times daily.  Dispense: 10 tablet; Refill: 0 - Urine Culture; Future  2. H/O abdominal hysterectomy  3. Pelvic pain - may need GI follow up if pain does not resolve with treatment of UTI

## 2020-11-29 ENCOUNTER — Other Ambulatory Visit: Payer: Self-pay

## 2020-11-29 ENCOUNTER — Other Ambulatory Visit (HOSPITAL_BASED_OUTPATIENT_CLINIC_OR_DEPARTMENT_OTHER)
Admission: RE | Admit: 2020-11-29 | Discharge: 2020-11-29 | Disposition: A | Discharge status: Home / Self Care | Payer: Medicare Other | Source: Ambulatory Visit | Attending: Obstetrics & Gynecology | Admitting: Obstetrics & Gynecology

## 2020-11-29 DIAGNOSIS — N3090 Cystitis, unspecified without hematuria: Secondary | ICD-10-CM | POA: Insufficient documentation

## 2020-12-03 LAB — URINE CULTURE: Culture: <10000 — AB

## 2020-12-05 ENCOUNTER — Telehealth (HOSPITAL_BASED_OUTPATIENT_CLINIC_OR_DEPARTMENT_OTHER): Payer: Self-pay

## 2020-12-05 DIAGNOSIS — E119 Type 2 diabetes mellitus without complications: Secondary | ICD-10-CM | POA: Diagnosis not present

## 2020-12-05 DIAGNOSIS — E559 Vitamin D deficiency, unspecified: Secondary | ICD-10-CM | POA: Diagnosis not present

## 2020-12-05 DIAGNOSIS — I10 Essential (primary) hypertension: Secondary | ICD-10-CM | POA: Diagnosis not present

## 2020-12-05 NOTE — Telephone Encounter (Signed)
Patient was notified of lab results and recommendations. tbw

## 2021-01-15 ENCOUNTER — Other Ambulatory Visit: Payer: Self-pay | Admitting: Hematology

## 2021-01-15 DIAGNOSIS — Z170 Estrogen receptor positive status [ER+]: Secondary | ICD-10-CM

## 2021-01-16 DIAGNOSIS — Z170 Estrogen receptor positive status [ER+]: Secondary | ICD-10-CM | POA: Diagnosis not present

## 2021-01-16 DIAGNOSIS — C50411 Malignant neoplasm of upper-outer quadrant of right female breast: Secondary | ICD-10-CM | POA: Diagnosis not present

## 2021-02-19 ENCOUNTER — Telehealth: Payer: Self-pay | Admitting: Nurse Practitioner

## 2021-02-19 NOTE — Telephone Encounter (Signed)
Rescheduled upcoming appointment due to provider's template. Patient is aware of changes. 

## 2021-02-20 DIAGNOSIS — Z1231 Encounter for screening mammogram for malignant neoplasm of breast: Secondary | ICD-10-CM | POA: Diagnosis not present

## 2021-02-26 ENCOUNTER — Encounter (HOSPITAL_BASED_OUTPATIENT_CLINIC_OR_DEPARTMENT_OTHER): Payer: Self-pay | Admitting: Obstetrics & Gynecology

## 2021-03-10 ENCOUNTER — Other Ambulatory Visit: Payer: Medicare Other

## 2021-03-10 ENCOUNTER — Ambulatory Visit: Payer: Medicare Other | Admitting: Nurse Practitioner

## 2021-03-18 NOTE — Progress Notes (Signed)
[place]   [phone] [phone]) [phone]   Clinic Follow up Note   Patient Care Team: [name], DO as PCP - General (Family Medicine) [name], MD as Consulting Physician (General Surgery) Truitt Merle, MD as Consulting Physician (Hematology) Gery Pray, MD as Consulting Physician (Radiation Oncology) Alla Feeling, NP as Nurse Practitioner (Nurse Practitioner) 03/19/2021  CHIEF COMPLAINT: Follow up right breast cancer   SUMMARY OF ONCOLOGIC HISTORY: Oncology History Overview Note  Cancer Staging Malignant neoplasm of upper-outer quadrant of right breast in female, estrogen receptor positive ([place]) Staging form: Breast, AJCC 8th Edition - Clinical stage from 01/04/2018: Stage IA (cT1c, cN0, cM0, G2, ER+, PR+, HER2-) - Signed by Truitt Merle, MD on 01/11/2018 - Pathologic: Stage IA (pT2, pN0, cM0, G2, ER+, PR+, HER2-) - Signed by Nicholas Lose, MD on 06/21/2018     Malignant neoplasm of upper-outer quadrant of right breast in female, estrogen receptor positive ([place])  12/28/2017 Mammogram   Bilateral diagnostic mammography with tomography and right breast ultrasonography at [facility] on 12/28/2017 showing: The irregular architectural distortion in the right breast upper outer quadrant posterior depth is indeterminate. The architectural distortion in the right breast upper outer quadrant middle depth is indeterminate.    01/03/2018 Pathology Results   Right needle core biopsy with pathology showing: Breast, right, needle core biopsy, (A) 11 o'clock with invasive mammary carcinoma, grade I-II. Breast, right, needle core biopsy, (B) 11 o'clock with invasive mammary carcinoma, grade I-II. Prognostic indicators significant for: ER, 60% positive with moderate staining intensity and PR, 90% positive with strong staining intensity. Proliferation marker Ki67 at 1%. HER2 negative.   01/04/2018 Cancer Staging   Staging form: Breast, AJCC 8th Edition - Clinical stage  from 01/04/2018: Stage IA (cT1c, cN0, cM0, G2, ER+, PR+, HER2-) - Signed by Truitt Merle, MD on 01/11/2018   01/25/2018 Genetic Testing   The Multi-Cancer Panel offered by [facility] includes sequencing and/or deletion duplication testing of the following 83 genes: ALK, APC, ATM, AXIN2,BAP1,  BARD1, BLM, BMPR1A, BRCA1, BRCA2, BRIP1, CASR, CDC73, CDH1, CDK4, CDKN1B, CDKN1C, CDKN2A (p14ARF), CDKN2A (p16INK4a), CEBPA, CHEK2, CTNNA1, DICER1, DIS3L2, EGFR (c.2369C>T, p.Thr790Met variant only), EPCAM (Deletion/duplication testing only), FH, FLCN, GATA2, GPC3, GREM1 (Promoter region deletion/duplication testing only), HOXB13 (c.251G>A, p.Gly84Glu), HRAS, KIT, MAX, MEN1, MET, MITF (c.952G>A, p.Glu318Lys variant only), MLH1, MSH2, MSH3, MSH6, MUTYH, NBN, NF1, NF2, NTHL1, PALB2, PDGFRA, PHOX2B, PMS2, POLD1, POLE, POT1, PRKAR1A, PTCH1, PTEN, RAD50, RAD51C, RAD51D, RB1, RECQL4, RET, RUNX1, SDHAF2, SDHA (sequence changes only), SDHB, SDHC, SDHD, SMAD4, SMARCA4, SMARCB1, SMARCE1, STK11, SUFU, TERC, TERT, TMEM127, TP53, TSC1, TSC2, VHL, WRN and WT1.   Results: No pathogenic variants identified.  A Variant of Uncertain significance in BAP1 was identified c.1066C>T (p.Arg356Trp).  The date of this test report is 01/25/2018.    03/09/2018 Surgery   Right lumpectomy: ILC grade 2, 2 foci, 2 cm, 2.5 cm, superior margin positive, 0/5 lymph nodes negative, ER 60%, PR 90%, Ki-67 1%, HER-2 negative T2N0 stage Ia   03/09/2018 Oncotype testing   Recurrence score 17 Distant risk of recurrence at 9 years with AI or Tamoxifen alone is 5% There is a <1% benefit of chemotherapy   03/31/2018 Pathology Results   Reexcision: No residual cancer   05/09/2018 - 07/08/2018 Radiation Therapy   Radiation with Dr. Sondra Come 05/09/18- 07/08/18    06/21/2018 Cancer Staging   Staging form: Breast, AJCC 8th Edition - Pathologic: Stage IA (pT2, pN0, cM0, G2, ER+, PR+, HER2-) - Signed by Nicholas Lose,  MD on 06/21/2018   07/2018 -  Anti-estrogen oral therapy    Anastrozole 30m daily starting in 07/2018. Stopped in early 03/2019 due to joint pain. Switched to Tamoxifen in 08/2019. Switched to Letrozole in 04/2020 due to joint pain and vignal itching -She was not able to try Exemestane due to copay    Survivorship   Per LCira Rue NP      CURRENT THERAPY:  Anastrozole 127mdaily starting in 07/2018. Stopped in early 03/2019 due to joint pain. Switched to Tamoxifen in 08/2019. Switched to Letrozole in 04/2020 due to joint pain and vaginal itching. Switched to Exemestane in 09/2020 due to insomnia.   INTERVAL HISTORY: Lisa Horton for follow up as scheduled. She was last seen by Dr. FeBurr Medico/7/22 and switched to exemestane. Mammogram 02/20/21 was negative.  On exemestane, her insomnia and hot flashes are better but she has vivid dreams that she occasionally acts out.  This occurs usually once per week, mostly if she forgets a pill in the morning and takes it later in the afternoon.  Otherwise tolerates well.  Losing weight intentionally.  Denies new breast lump/mass, nipple discharge or inversion, or skin change.  All other systems were reviewed with the patient and are negative.  MEDICAL HISTORY:  Past Medical History:  Diagnosis Date   Acid reflux    Allergic rhinitis    Arthritis    Asthma    Bruises easily    Cancer ([place]   rt breast cancer   Chicken pox    Colon polyp    Family history of breast cancer    Family history of pancreatic cancer    Family history of prostate cancer    Gum disease    Hypertension    Hypothyroidism    Incontinence    Measles    Myopathy    arms and legs, statin drug related   Prediabetes 06/2014    SURGICAL HISTORY: Past Surgical History:  Procedure Laterality Date   ABDOMINAL HYSTERECTOMY  1986   secondary to fibroids   BREAST LUMPECTOMY WITH RADIOACTIVE SEED AND SENTINEL LYMPH NODE BIOPSY Right 03/09/2018   Procedure: RIGHT BREAST LUMPECTOMY WITH BRACKETED RADIOACTIVE SEED AND SENTINEL LYMPH NODE  BIOPSY;  Surgeon: WaRolm BookbinderMD;  [facility];  Laterality: Right;   BREAST SURGERY  07/09/2008   mass removal   CHOLECYSTECTOMY  03/17/11   RE-EXCISION OF BREAST LUMPECTOMY Right 03/31/2018   Procedure: RE-EXCISION OF BREAST LUMPECTOMY;  Surgeon: WaRolm BookbinderMD;  [facility];  Laterality: Right;   SKIN TAG REMOVAL     brow and lid   THIGH / KNEE SOFT TISSUE BIOPSY  09/05/2009   [facility]  I have reviewed the social history and family history with the patient and they are unchanged from previous note.  ALLERGIES:  is allergic to statins, adhesive [tape], hydrogen peroxide, lidocaine, metrogel [metronidazole], latex, neomycin-bacitracin zn-polymyx, and sulfa antibiotics.  MEDICATIONS:  Current Outpatient Medications  Medication Sig Dispense Refill   betamethasone valerate ointment (VALISONE) 0.1 % Apply 1 application topically 2 (two) times daily. Use for up to 2 weeks 30 g 0   cetirizine (ZYRTEC) 10 MG tablet Take 10 mg by mouth daily.     exemestane (AROMASIN) 25 MG tablet Take 1 tablet (25 mg total) by mouth daily after breakfast. 90 tablet 1   irbesartan (AVAPRO) 300 MG tablet Take 300 mg by mouth  daily.      levothyroxine (SYNTHROID) 125 MCG tablet Take 125 mcg by mouth daily.     NONFORMULARY OR COMPOUNDED ITEM Vitamin E vaginal suppositories 200u/ml.  One pv three times weekly. 36 each 3   Semaglutide (OZEMPIC, 0.25 OR 0.5 MG/DOSE, [place]) Inject into the skin.     spironolactone (ALDACTONE) 50 MG tablet Take 50 mg by mouth daily. Taking 1/2 pill daily     Vitamin D, Ergocalciferol, (DRISDOL) 50000 units CAPS capsule Take 1 capsule by mouth once a week.  3   No current facility-administered medications for this visit.    PHYSICAL EXAMINATION: ECOG PERFORMANCE STATUS: 1 - Symptomatic but completely ambulatory  Vitals:   03/19/21 1224  BP: 140/87  Pulse: 98  Resp: 18   Temp: 98.9 F (37.2 C)  SpO2: 99%   Filed Weights   03/19/21 1224  Weight: 210 lb (95.3 kg)    GENERAL:alert, no distress and comfortable SKIN: No rash EYES: sclera clear NECK: Without mass LYMPH:  no palpable cervical or supraclavicular lymphadenopathy LUNGS: normal breathing effort HEART: no lower extremity edema NEURO: alert & oriented x 3 with fluent speech Breast exam: Breasts are symmetric without nipple discharge or inversion.  S/p right lumpectomy, incisions completely healed with mild retraction at the scars.  Mild right breast lymphedema and skin thickening.  No palpable mass or nodularity in either breast or axilla that I could appreciate  LABORATORY DATA:  I have reviewed the data as listed CBC Latest Ref Rng & Units 03/19/2021 09/09/2020 05/15/2020  WBC 4.0 - 10.5 K/uL 7.6 6.8 7.1  Hemoglobin 12.0 - 15.0 g/dL 12.7 11.8(L) 11.6(L)  Hematocrit 36.0 - 46.0 % 40.0 37.7 36.7  Platelets 150 - 400 K/uL 199 166 158     CMP Latest Ref Rng & Units 03/19/2021 09/09/2020 05/15/2020  Glucose 70 - 99 mg/dL 97 160(H) 114(H)  BUN 8 - 23 mg/dL _0 Creatinine 0.44 - 1.00 mg/dL 1.37(H) 1.38(H) 1.18(H)  Sodium 135 - 145 mmol/L 140 139 139  Potassium 3.5 - 5.1 mmol/L 4.7 4.3 4.2  Chloride 98 - 111 mmol/L 105 107 106  CO2 22 - 32 mmol/L _1 Calcium 8.9 - 10.3 mg/dL 9.7 8.9 9.3  Total Protein 6.5 - 8.1 g/dL 8.1 7.9 7.9  Total Bilirubin 0.3 - 1.2 mg/dL 1.0 0.5 0.7  Alkaline Phos 38 - 126 U/L 64 59 50  AST 15 - 41 U/L _2 ALT 0 - 44 U/L _3 RADIOGRAPHIC STUDIES: I have personally reviewed the radiological images as listed and agreed with the findings in the report. No results found.   ASSESSMENT & PLAN: Lisa Horton is a 75 y.o. female with      1. Malignant neoplasm of upper outer quadrant of right breast, invasive lobular carcinoma, Stage IA, pT2N0M0, G2, ER (+), PR (+), HER2 negative -Diagnosed in 01/2018. S/p right breast lumpectomy and  adjuvant radiation.  -Her Oncotype score showed low risk with recurrence score 17, adjuvant chemotherapy was not recommended  -She started anti-estrogen therapy in 07/2018. She has tried Anastrozole, Tamoxifen, then Letrozole but switched due to AE's (arthrlagia, vaginal itching, insomnia, nightmares).  -The calcifications of her left breast on 02/2020 mammogram were benign fibrocystic  -01/2021 mammogram negative -continue surveillance and anti-estrogen     2. Osteopenia -Her 01/2017 and 02/2019 DEXA's showed Osteopenia  -She did not tolerate Tamoxifen well and is currently on AI which  can weaken her bone. she declined zometa or prolia in the past  -continue vitamin D and weight bearing exercise, she gets constipated from calcium -repeat DEXA due now   3. Joint pain -secondary to antiestrogen therapy  -worse in her left foot and achilles tendon.  -improved on exemestane (worse on anastrozole)   4. Mild Anemia  -She had new onset of mild anemia with Hg 11.6 on 05/15/20 -resolved, iron studies pending    5. Insomnia, nightmares  -She attributes her insomnia to Letrozole. She also notes having nightmares that she acts out at least once per week. Occurs if she misses AM dose of exemestane and takes it later in the day -has not improved on exemestane but willing to tolerate and continue    Disposition: Ms. Doxtater is clinically doing well.  Tolerating exemestane with improved insomnia and hot flashes but with vivid dreams/nightmares.  She remains safe at home.  We discussed the options including re-challenge previous antiestrogen agent versus discontinue.  She does not want to re-try anastrozole due to severe joint pain.  I reviewed the potential benefit given strong ER/PR positivity and encouraged her to continue AI. She agrees to continue exemestane for now. She pays $44 co-pay.  Today's physical exam is benign, CBC and CMP unremarkable except SCR 1.37.  Mammogram 01/2021 is negative.  Overall  no clinical concern for breast cancer recurrence.  She is over 3 years from initial diagnosis, continue surveillance and AI.  I encouraged her to continue healthy lifestyle, physical exercise, age-appropriate cancer screenings/vaccines, routine medical care, avoiding smoking, and limiting alcohol.  She can proceed with DEXA, it is due now. Return for lab and next surveillance visit in 6 months, or sooner if needed.  Orders Placed This Encounter  Procedures   DG Bone Density    Standing Status:   Future    Standing Expiration Date:   03/19/2022    Scheduling Instructions:     [facility]    Order Specific Question:   Reason for Exam (SYMPTOM  OR DIAGNOSIS REQUIRED)    Answer:   osteopenia, on AI    Order Specific Question:   Preferred imaging location?    Answer:   External    All questions were answered. The patient knows to call the clinic with any problems, questions or concerns. No barriers to learning were detected.     Alla Feeling, NP 03/19/21

## 2021-03-19 ENCOUNTER — Other Ambulatory Visit: Payer: Self-pay

## 2021-03-19 ENCOUNTER — Inpatient Hospital Stay: Payer: Medicare Other | Attending: Nurse Practitioner

## 2021-03-19 ENCOUNTER — Inpatient Hospital Stay: Payer: Medicare Other | Admitting: Nurse Practitioner

## 2021-03-19 ENCOUNTER — Encounter: Payer: Self-pay | Admitting: Nurse Practitioner

## 2021-03-19 VITALS — BP 140/87 | HR 98 | Temp 98.9°F | Resp 18 | Wt 210.0 lb

## 2021-03-19 DIAGNOSIS — Z170 Estrogen receptor positive status [ER+]: Secondary | ICD-10-CM | POA: Diagnosis not present

## 2021-03-19 DIAGNOSIS — C50411 Malignant neoplasm of upper-outer quadrant of right female breast: Secondary | ICD-10-CM | POA: Insufficient documentation

## 2021-03-19 DIAGNOSIS — Z79811 Long term (current) use of aromatase inhibitors: Secondary | ICD-10-CM | POA: Diagnosis not present

## 2021-03-19 DIAGNOSIS — M8580 Other specified disorders of bone density and structure, unspecified site: Secondary | ICD-10-CM | POA: Insufficient documentation

## 2021-03-19 DIAGNOSIS — D649 Anemia, unspecified: Secondary | ICD-10-CM | POA: Diagnosis not present

## 2021-03-19 DIAGNOSIS — G4700 Insomnia, unspecified: Secondary | ICD-10-CM | POA: Diagnosis not present

## 2021-03-19 LAB — CMP (CANCER CENTER ONLY)
ALT: 19 U/L (ref 0–44)
AST: 17 U/L (ref 15–41)
Albumin: 3.7 g/dL (ref 3.5–5.0)
Alkaline Phosphatase: 64 U/L (ref 38–126)
Anion gap: 10 (ref 5–15)
BUN: 16 mg/dL (ref 8–23)
CO2: 25 mmol/L (ref 22–32)
Calcium: 9.7 mg/dL (ref 8.9–10.3)
Chloride: 105 mmol/L (ref 98–111)
Creatinine: 1.37 mg/dL — ABNORMAL HIGH (ref 0.44–1.00)
GFR, Estimated: 41 mL/min — ABNORMAL LOW (ref >60)
Glucose, Bld: 97 mg/dL (ref 70–99)
Potassium: 4.7 mmol/L (ref 3.5–5.1)
Sodium: 140 mmol/L (ref 135–145)
Total Bilirubin: 1 mg/dL (ref 0.3–1.2)
Total Protein: 8.1 g/dL (ref 6.5–8.1)

## 2021-03-19 LAB — CBC WITH DIFFERENTIAL (CANCER CENTER ONLY)
Abs Immature Granulocytes: 0.04 10*3/uL (ref 0.00–0.07)
Basophils Absolute: 0 10*3/uL (ref 0.0–0.1)
Basophils Relative: 1 %
Eosinophils Absolute: 0.4 10*3/uL (ref 0.0–0.5)
Eosinophils Relative: 5 %
HCT: 40 % (ref 36.0–46.0)
Hemoglobin: 12.7 g/dL (ref 12.0–15.0)
Immature Granulocytes: 1 %
Lymphocytes Relative: 22 %
Lymphs Abs: 1.7 10*3/uL (ref 0.7–4.0)
MCH: 29.7 pg (ref 26.0–34.0)
MCHC: 31.8 g/dL (ref 30.0–36.0)
MCV: 93.7 fL (ref 80.0–100.0)
Monocytes Absolute: 0.5 10*3/uL (ref 0.1–1.0)
Monocytes Relative: 7 %
Neutro Abs: 4.9 10*3/uL (ref 1.7–7.7)
Neutrophils Relative %: 64 %
Platelet Count: 199 10*3/uL (ref 150–400)
RBC: 4.27 MIL/uL (ref 3.87–5.11)
RDW: 13.3 % (ref 11.5–15.5)
WBC Count: 7.6 10*3/uL (ref 4.0–10.5)
nRBC: 0 % (ref 0.0–0.2)

## 2021-03-19 LAB — IRON AND TIBC
Iron: 91 ug/dL (ref 41–142)
Saturation Ratios: 31 % (ref 21–57)
TIBC: 298 ug/dL (ref 236–444)
UIBC: 207 ug/dL (ref 120–384)

## 2021-03-19 LAB — FERRITIN: Ferritin: 236 ng/mL (ref 11–307)

## 2021-03-19 MED ORDER — EXEMESTANE 25 MG PO TABS: 25.0000 mg | ORAL_TABLET | Freq: Every day | ORAL | 1 refills | Status: DC

## 2021-04-17 DIAGNOSIS — E559 Vitamin D deficiency, unspecified: Secondary | ICD-10-CM | POA: Diagnosis not present

## 2021-04-17 DIAGNOSIS — I10 Essential (primary) hypertension: Secondary | ICD-10-CM | POA: Diagnosis not present

## 2021-04-17 DIAGNOSIS — E119 Type 2 diabetes mellitus without complications: Secondary | ICD-10-CM | POA: Diagnosis not present

## 2021-04-23 DIAGNOSIS — E119 Type 2 diabetes mellitus without complications: Secondary | ICD-10-CM | POA: Diagnosis not present

## 2021-04-23 DIAGNOSIS — J309 Allergic rhinitis, unspecified: Secondary | ICD-10-CM | POA: Diagnosis not present

## 2021-04-23 DIAGNOSIS — E039 Hypothyroidism, unspecified: Secondary | ICD-10-CM | POA: Diagnosis not present

## 2021-04-23 DIAGNOSIS — Z853 Personal history of malignant neoplasm of breast: Secondary | ICD-10-CM | POA: Diagnosis not present

## 2021-04-23 DIAGNOSIS — E559 Vitamin D deficiency, unspecified: Secondary | ICD-10-CM | POA: Diagnosis not present

## 2021-04-23 DIAGNOSIS — Z23 Encounter for immunization: Secondary | ICD-10-CM | POA: Diagnosis not present

## 2021-04-23 DIAGNOSIS — I10 Essential (primary) hypertension: Secondary | ICD-10-CM | POA: Diagnosis not present

## 2021-05-10 ENCOUNTER — Emergency Department (HOSPITAL_COMMUNITY)
Admission: EM | Admit: 2021-05-10 | Discharge: 2021-05-10 | Disposition: A | Discharge status: Home / Self Care | Payer: Medicare Other | Attending: Emergency Medicine | Admitting: Emergency Medicine

## 2021-05-10 ENCOUNTER — Other Ambulatory Visit: Payer: Self-pay

## 2021-05-10 ENCOUNTER — Emergency Department (HOSPITAL_BASED_OUTPATIENT_CLINIC_OR_DEPARTMENT_OTHER): Payer: Medicare Other

## 2021-05-10 ENCOUNTER — Encounter (HOSPITAL_COMMUNITY): Payer: Self-pay | Admitting: Emergency Medicine

## 2021-05-10 DIAGNOSIS — M79605 Pain in left leg: Secondary | ICD-10-CM

## 2021-05-10 DIAGNOSIS — Z87891 Personal history of nicotine dependence: Secondary | ICD-10-CM | POA: Diagnosis not present

## 2021-05-10 DIAGNOSIS — Z794 Long term (current) use of insulin: Secondary | ICD-10-CM | POA: Insufficient documentation

## 2021-05-10 DIAGNOSIS — E039 Hypothyroidism, unspecified: Secondary | ICD-10-CM | POA: Insufficient documentation

## 2021-05-10 DIAGNOSIS — Z7982 Long term (current) use of aspirin: Secondary | ICD-10-CM | POA: Diagnosis not present

## 2021-05-10 DIAGNOSIS — R609 Edema, unspecified: Secondary | ICD-10-CM

## 2021-05-10 DIAGNOSIS — Z91040 Latex allergy status: Secondary | ICD-10-CM | POA: Insufficient documentation

## 2021-05-10 DIAGNOSIS — E119 Type 2 diabetes mellitus without complications: Secondary | ICD-10-CM | POA: Insufficient documentation

## 2021-05-10 DIAGNOSIS — Z79899 Other long term (current) drug therapy: Secondary | ICD-10-CM | POA: Diagnosis not present

## 2021-05-10 DIAGNOSIS — M25559 Pain in unspecified hip: Secondary | ICD-10-CM | POA: Insufficient documentation

## 2021-05-10 DIAGNOSIS — Z853 Personal history of malignant neoplasm of breast: Secondary | ICD-10-CM | POA: Insufficient documentation

## 2021-05-10 DIAGNOSIS — J4540 Moderate persistent asthma, uncomplicated: Secondary | ICD-10-CM | POA: Diagnosis not present

## 2021-05-10 DIAGNOSIS — I10 Essential (primary) hypertension: Secondary | ICD-10-CM | POA: Diagnosis not present

## 2021-05-10 MED ORDER — ACETAMINOPHEN 325 MG PO TABS
650.0000 mg | ORAL_TABLET | Freq: Once | ORAL | Status: AC
  Administered 2021-05-10: 650 mg via ORAL
  Filled 2021-05-10: qty 2

## 2021-05-10 NOTE — ED Provider Notes (Signed)
Patient with [facility] DEPT Provider Note   CSN: [id number] Arrival date & time: 05/10/21  1616     History Chief Complaint  Patient presents with   Leg Pain   Hip Pain    Lisa Horton is a 75 y.o. female.  Patient presents ER chief complaint of left lower extremity pain.  Describes as a sharp ache behind the calf of the left lower extremity.  She states at times it radiates up to her left thigh but currently is mostly relegated to her left calf.  Symptoms ongoing for the past 3 to 4 days.  No reports of fall or trauma that she can recall.  Denies any fevers or cough or vomiting or diarrhea.  She is taking aspirin at home with minimal improvement of pain.      Past Medical History:  Diagnosis Date   Acid reflux    Allergic rhinitis    Arthritis    Asthma    Bruises easily    Cancer ([place])    rt breast cancer   Chicken pox    Colon polyp    Family history of breast cancer    Family history of pancreatic cancer    Family history of prostate cancer    Gum disease    Hypertension    Hypothyroidism    Incontinence    Measles    Myopathy    arms and legs, statin drug related   Prediabetes 06/2014    Patient Active Problem List   Diagnosis Date Noted   High cholesterol 11/19/2020   Obesity 11/19/2020   Osteopenia 11/19/2020   Type 2 diabetes mellitus without complications ([place]) 76/22/6333   Genetic testing 02/04/2018   Family history of pancreatic cancer    Family history of prostate cancer    Family history of breast cancer    Malignant neoplasm of upper-outer quadrant of right breast in female, estrogen receptor positive ([place]) 01/07/2018   Moderate persistent asthma without complication 54/56/2563   Allergic rhinitis 11/28/2015   HYPOTHYROIDISM 10/02/2010   HYPERTENSION 10/02/2010   G E R D 10/02/2010   OSTEOARTHRITIS 10/02/2010    Past Surgical History:  Procedure Laterality Date   ABDOMINAL HYSTERECTOMY  1986    secondary to fibroids   BREAST LUMPECTOMY WITH RADIOACTIVE SEED AND SENTINEL LYMPH NODE BIOPSY Right 03/09/2018   Procedure: RIGHT BREAST LUMPECTOMY WITH BRACKETED RADIOACTIVE SEED AND SENTINEL LYMPH NODE BIOPSY;  Surgeon: Rolm Bookbinder, MD;  Location: [facility];  Service: General;  Laterality: Right;   BREAST SURGERY  07/09/2008   mass removal   CHOLECYSTECTOMY  03/17/11   RE-EXCISION OF BREAST LUMPECTOMY Right 03/31/2018   Procedure: RE-EXCISION OF BREAST LUMPECTOMY;  Surgeon: Rolm Bookbinder, MD;  Location: [facility];  Service: General;  Laterality: Right;   SKIN TAG REMOVAL     brow and lid   THIGH / KNEE SOFT TISSUE BIOPSY  09/05/2009   TUBAL LIGATION  1980     OB History     Gravida  2   Para  2   Term  2   Preterm  0   AB  0   Living  2      SAB  0   IAB  0   Ectopic  0   Multiple  0   Live Births  2           Family History  Problem Relation Age of Onset   Hypertension Mother  Pancreatic cancer Mother 53   Hypertension Father    Prostate cancer Father 35   Emphysema Father    Hypertension Sister    Breast cancer Sister        dx >50, caught very early   Hypertension Brother    Alzheimer's disease Brother    Hypertension Sister    Heart failure Brother    Hypertension Brother    Breast cancer Daughter 43       BRCA1/2 negative, never had panel testing   Allergies Sister    Allergies Brother    Throat cancer Maternal Uncle     Social History   Tobacco Use   Smoking status: Former    Packs/day: 1.00    Years: 48.00    Pack years: 48.00    Types: Cigarettes    Quit date: 08/03/2008    Years since quitting: 12.7   Smokeless tobacco: Never  Vaping Use   Vaping Use: Never used  Substance Use Topics   Alcohol use: Yes    Alcohol/week: 0.0 - 1.0 standard drinks   Drug use: No    Home Medications Prior to Admission medications   Medication Sig Start Date End Date Taking? Authorizing Provider   betamethasone valerate ointment (VALISONE) 0.1 % Apply 1 application topically 2 (two) times daily. Use for up to 2 weeks 07/04/20   Salvadore Dom, MD  cetirizine (ZYRTEC) 10 MG tablet Take 10 mg by mouth daily.    [provider]  exemestane (AROMASIN) 25 MG tablet Take 1 tablet (25 mg total) by mouth daily after breakfast. 03/19/21   Alla Feeling, NP  irbesartan (AVAPRO) 300 MG tablet Take 300 mg by mouth daily.  10/28/13   [provider]  levothyroxine (SYNTHROID) 125 MCG tablet Take 125 mcg by mouth daily. 05/02/20   [provider]  NONFORMULARY OR COMPOUNDED ITEM Vitamin E vaginal suppositories 200u/ml.  One pv three times weekly. 11/13/20   Megan Salon, MD  Semaglutide (OZEMPIC, 0.25 OR 0.5 MG/DOSE, [place]) Inject into the skin.    [provider]  spironolactone (ALDACTONE) 50 MG tablet Take 50 mg by mouth daily. Taking 1/2 pill daily    [provider]  Vitamin D, Ergocalciferol, (DRISDOL) 50000 units CAPS capsule Take 1 capsule by mouth once a week. 12/02/17   [provider]    Allergies    Statins, Adhesive [tape], Hydrogen peroxide, Lidocaine, Metrogel [metronidazole], Latex, Neomycin-bacitracin zn-polymyx, and Sulfa antibiotics  Review of Systems   Review of Systems  Constitutional:  Negative for fever.  HENT:  Negative for ear pain.   Eyes:  Negative for pain.  Respiratory:  Negative for cough.   Cardiovascular:  Negative for chest pain.  Gastrointestinal:  Negative for abdominal pain.  Genitourinary:  Negative for flank pain.  Musculoskeletal:  Negative for back pain.  Skin:  Negative for rash.  Neurological:  Negative for headaches.   Physical Exam Updated Vital Signs BP 140/80 (BP Location: Left Arm)   Pulse 81   Temp 98.2 F (36.8 C) (Oral)   Resp 18   LMP 08/03/1984 (Approximate)   SpO2 98%   Physical Exam Constitutional:      General: She is not in acute distress.    Appearance: Normal appearance.   HENT:     Head: Normocephalic.     Nose: Nose normal.  Eyes:     Extraocular Movements: Extraocular movements intact.  Cardiovascular:     Rate and Rhythm: Normal rate.  Pulmonary:     Effort: Pulmonary effort is normal.  Musculoskeletal:        General: Normal range of motion.     Cervical back: Normal range of motion.     Comments: Mild increased welling seen in the left lower extremity.  No evidence of cellulitis or abnormal warmth or erythema noted.  Moderate tenderness palpation in the left lower extremity posterior calf region.  Otherwise neurovascularly intact dorsalis pedis pulses are normal 2+ bilaterally.  Bilateral lower extremities appear well perfused and warm to the touch.  Neurological:     General: No focal deficit present.     Mental Status: She is alert. Mental status is at baseline.    ED Results / Procedures / Treatments   Labs (all labs ordered are listed, but only abnormal results are displayed) Labs Reviewed - No data to display  EKG None  Radiology VAS [place] LOWER EXTREMITY VENOUS (DVT) (ONLY [facility] & [facility])  Result Date: 05/10/2021  Lower Venous DVT Study Patient Name:  Lisa Horton  Date of Exam:   05/10/2021 Medical Rec #: [id number]        Accession #:    [id number] Date of Birth: [birth year]       Patient Gender: F Patient Age:   58 years Exam Location:  [facility] Procedure:      VAS [place] LOWER EXTREMITY VENOUS (DVT) Referring Phys: JOSHUA HONG --------------------------------------------------------------------------------  Indications: Lateral leg pain, edema.  Limitations: Patient complained of pain with touch. Edema and body habitus. Comparison Study: Prior negative left lower extremity venous Dopplers done                   04/05/2019 and 06/13/2018 Performing Technologist: Sharion Dove RVS  Examination Guidelines: A complete evaluation includes B-mode imaging, spectral Doppler, color Doppler, and power Doppler as needed of all accessible portions of  each vessel. Bilateral testing is considered an integral part of a complete examination. Limited examinations for reoccurring indications may be performed as noted. The reflux portion of the exam is performed with the patient in reverse Trendelenburg.  +-----+---------------+---------+-----------+----------+--------------+ RIGHTCompressibilityPhasicitySpontaneityPropertiesThrombus Aging +-----+---------------+---------+-----------+----------+--------------+ CFV  Full           Yes      Yes                                 +-----+---------------+---------+-----------+----------+--------------+   +---------+---------------+---------+-----------+----------+-------------------+ LEFT     CompressibilityPhasicitySpontaneityPropertiesThrombus Aging      +---------+---------------+---------+-----------+----------+-------------------+ CFV      Full           Yes      Yes                                      +---------+---------------+---------+-----------+----------+-------------------+ SFJ      Full                                                             +---------+---------------+---------+-----------+----------+-------------------+ FV Prox  Full           Yes      Yes                                      +---------+---------------+---------+-----------+----------+-------------------+  FV Mid   Full                                                             +---------+---------------+---------+-----------+----------+-------------------+ FV DistalFull                                                             +---------+---------------+---------+-----------+----------+-------------------+ PFV      Full                                                             +---------+---------------+---------+-----------+----------+-------------------+ POP      Full           Yes      Yes                                       +---------+---------------+---------+-----------+----------+-------------------+ PTV      Full                                                             +---------+---------------+---------+-----------+----------+-------------------+ PERO                                                  Not well visualized +---------+---------------+---------+-----------+----------+-------------------+    Summary: RIGHT: - No evidence of common femoral vein obstruction.  LEFT: - There is no evidence of deep vein thrombosis in the lower extremity. However, portions of this examination were limited- see technologist comments above.  *See table(s) above for measurements and observations.    Preliminary     Procedures Procedures   Medications Ordered in ED Medications  acetaminophen (TYLENOL) tablet 650 mg (650 mg Oral Given 05/10/21 1713)    ED Course  I have reviewed the triage vital signs and the nursing notes.  Pertinent labs & imaging results that were available during my care of the patient were reviewed by me and considered in my medical decision making (see chart for details).    MDM Rules/Calculators/A&P                           No evidence of DVT on ultrasound.  Patient given Tylenol here with improvement of symptoms.  Advised rest, normal at home.  Advised immediate return for fevers worsening symptoms or any additional concerns.  Final Clinical Impression(s) / ED Diagnoses Final diagnoses:  Left leg pain    Rx / DC Orders ED Discharge Orders     None  Luna Fuse, MD 05/10/21 1924

## 2021-05-10 NOTE — ED Triage Notes (Signed)
Patient presents with left leg pain which radiates up to the left hip and left side of her waist. Patient has had this pain since Wednesday.

## 2021-05-10 NOTE — Progress Notes (Signed)
VASCULAR LAB    Left lower extremity venous duplex has been performed.  See CV proc for preliminary results.  Messaged results to Dr. Almyra Free via secure chat  Sharion Dove, RVT 05/10/2021, 6:54 PM

## 2021-05-10 NOTE — Discharge Instructions (Addendum)
Call your primary care doctor or specialist as discussed in the next 2-3 days.   Return immediately back to the ER if:  Your symptoms worsen within the next 12-24 hours. You develop new symptoms such as new fevers, persistent vomiting, new pain, shortness of breath, or new weakness or numbness, or if you have any other concerns.  

## 2021-05-21 ENCOUNTER — Other Ambulatory Visit: Payer: Self-pay

## 2021-05-21 ENCOUNTER — Ambulatory Visit: Payer: Medicare Other | Admitting: Podiatry

## 2021-05-21 DIAGNOSIS — L600 Ingrowing nail: Secondary | ICD-10-CM

## 2021-05-28 ENCOUNTER — Encounter: Payer: Self-pay | Admitting: Podiatry

## 2021-05-28 NOTE — Progress Notes (Signed)
Subjective:  Patient ID: Lisa Horton, female    DOB: October 15, 1945,  MRN: [id number]  No chief complaint on file.   75 y.o. female presents with the above complaint.  Patient presents with complaint of bilateral hallux border ingrown.  Patient states painful to touch.  She would like to have removed.  She has been For years.  She denies any other acute complaints.   Review of Systems: Negative except as noted in the HPI. Denies N/V/F/Ch.  Past Medical History:  Diagnosis Date   Acid reflux    Allergic rhinitis    Arthritis    Asthma    Bruises easily    Cancer ([place])    rt breast cancer   Chicken pox    Colon polyp    Family history of breast cancer    Family history of pancreatic cancer    Family history of prostate cancer    Gum disease    Hypertension    Hypothyroidism    Incontinence    Measles    Myopathy    arms and legs, statin drug related   Prediabetes 06/2014    Current Outpatient Medications:    betamethasone valerate ointment (VALISONE) 0.1 %, Apply 1 application topically 2 (two) times daily. Use for up to 2 weeks, Disp: 30 g, Rfl: 0   cetirizine (ZYRTEC) 10 MG tablet, Take 10 mg by mouth daily., Disp: , Rfl:    exemestane (AROMASIN) 25 MG tablet, Take 1 tablet (25 mg total) by mouth daily after breakfast., Disp: 90 tablet, Rfl: 1   irbesartan (AVAPRO) 300 MG tablet, Take 300 mg by mouth daily. , Disp: , Rfl:    levothyroxine (SYNTHROID) 125 MCG tablet, Take 125 mcg by mouth daily., Disp: , Rfl:    NONFORMULARY OR COMPOUNDED ITEM, Vitamin E vaginal suppositories 200u/ml.  One pv three times weekly., Disp: 36 each, Rfl: 3   Semaglutide (OZEMPIC, 0.25 OR 0.5 MG/DOSE, [place]), Inject into the skin., Disp: , Rfl:    spironolactone (ALDACTONE) 50 MG tablet, Take 50 mg by mouth daily. Taking 1/2 pill daily, Disp: , Rfl:    Vitamin D, Ergocalciferol, (DRISDOL) 50000 units CAPS capsule, Take 1 capsule by mouth once a week., Disp: , Rfl: 3  Social History   Tobacco  Use  Smoking Status Former   Packs/day: 1.00   Years: 48.00   Pack years: 48.00   Types: Cigarettes   Quit date: 08/03/2008   Years since quitting: 12.8  Smokeless Tobacco Never    Allergies  Allergen Reactions   Statins Other (See Comments)    Medication Crestor:   Urine became very dark within two days.   Nerve damage and myopathy as a result.    Adhesive [Tape]    Hydrogen Peroxide Other (See Comments)    Blisters    Lidocaine Other (See Comments)    Combination of Lidocaine and Epinephrine during dental procedure caused her to "nearly arrest".    Metrogel [Metronidazole] Other (See Comments)    Increase in vaginal burning.  Does better with Flagyl   Latex Rash   Neomycin-Bacitracin Zn-Polymyx Rash   Sulfa Antibiotics Rash   Objective:  There were no vitals filed for this visit. There is no height or weight on file to calculate BMI. Constitutional Well developed. Well nourished.  Vascular Dorsalis pedis pulses palpable bilaterally. Posterior tibial pulses palpable bilaterally. Capillary refill normal to all digits.  No cyanosis or clubbing noted. Pedal hair growth normal.  Neurologic Normal speech. Oriented to  person, place, and time. Epicritic sensation to light touch grossly present bilaterally.  Dermatologic Painful ingrowing nail at lateral nail borders of the hallux nail bilaterally. No other open wounds. No skin lesions.  Orthopedic: Normal joint ROM without pain or crepitus bilaterally. No visible deformities. No bony tenderness.   Radiographs: None Assessment:   1. Ingrown toenail of right foot   2. Ingrown left big toenail    Plan:  Patient was evaluated and treated and all questions answered.  Ingrown Nail, bilaterally -Patient elects to proceed with minor surgery to remove ingrown toenail removal today. Consent reviewed and signed by patient. -Ingrown nail excised. See procedure note. -Educated on post-procedure care including soaking. Written  instructions provided and reviewed. -Patient to follow up in 2 weeks for nail check.  Procedure: Excision of Ingrown Toenail Location: Bilateral 1st toe lateral nail borders. Anesthesia: Lidocaine 1% plain; 1.5 mL and Marcaine 0.5% plain; 1.5 mL, digital block. Skin Prep: Betadine. Dressing: Silvadene; telfa; dry, sterile, compression dressing. Technique: Following skin prep, the toe was exsanguinated and a tourniquet was secured at the base of the toe. The affected nail border was freed, split with a nail splitter, and excised. Chemical matrixectomy was then performed with phenol and irrigated out with alcohol. The tourniquet was then removed and sterile dressing applied. Disposition: Patient tolerated procedure well. Patient to return in 2 weeks for follow-up.   No follow-ups on file.

## 2021-09-08 ENCOUNTER — Other Ambulatory Visit: Payer: Self-pay | Admitting: Nurse Practitioner

## 2021-09-08 NOTE — Telephone Encounter (Signed)
Per last note, she has not improved on exemestane but willing to tolerate and continue. Gardiner Rhyme, RN

## 2021-09-15 ENCOUNTER — Inpatient Hospital Stay: Payer: Medicare Other

## 2021-09-15 ENCOUNTER — Inpatient Hospital Stay: Payer: Medicare Other | Attending: Hematology | Admitting: Hematology

## 2021-09-15 ENCOUNTER — Encounter: Payer: Self-pay | Admitting: Hematology

## 2021-09-15 ENCOUNTER — Other Ambulatory Visit: Payer: Self-pay

## 2021-09-15 VITALS — BP 139/69 | HR 105 | Temp 98.2°F | Resp 19 | Ht 62.0 in | Wt 214.8 lb

## 2021-09-15 DIAGNOSIS — M8580 Other specified disorders of bone density and structure, unspecified site: Secondary | ICD-10-CM | POA: Insufficient documentation

## 2021-09-15 DIAGNOSIS — Z79811 Long term (current) use of aromatase inhibitors: Secondary | ICD-10-CM | POA: Diagnosis not present

## 2021-09-15 DIAGNOSIS — Z170 Estrogen receptor positive status [ER+]: Secondary | ICD-10-CM | POA: Insufficient documentation

## 2021-09-15 DIAGNOSIS — E2839 Other primary ovarian failure: Secondary | ICD-10-CM | POA: Diagnosis not present

## 2021-09-15 DIAGNOSIS — Z1231 Encounter for screening mammogram for malignant neoplasm of breast: Secondary | ICD-10-CM | POA: Diagnosis not present

## 2021-09-15 DIAGNOSIS — C50411 Malignant neoplasm of upper-outer quadrant of right female breast: Secondary | ICD-10-CM | POA: Insufficient documentation

## 2021-09-15 DIAGNOSIS — Z79899 Other long term (current) drug therapy: Secondary | ICD-10-CM | POA: Insufficient documentation

## 2021-09-15 LAB — CMP (CANCER CENTER ONLY)
ALT: 15 U/L (ref 0–44)
AST: 17 U/L (ref 15–41)
Albumin: 4.1 g/dL (ref 3.5–5.0)
Alkaline Phosphatase: 69 U/L (ref 38–126)
Anion gap: 7 (ref 5–15)
BUN: 16 mg/dL (ref 8–23)
CO2: 29 mmol/L (ref 22–32)
Calcium: 9.9 mg/dL (ref 8.9–10.3)
Chloride: 103 mmol/L (ref 98–111)
Creatinine: 1.1 mg/dL — ABNORMAL HIGH (ref 0.44–1.00)
GFR, Estimated: 53 mL/min — ABNORMAL LOW (ref >60)
Glucose, Bld: 142 mg/dL — ABNORMAL HIGH (ref 70–99)
Potassium: 4.1 mmol/L (ref 3.5–5.1)
Sodium: 139 mmol/L (ref 135–145)
Total Bilirubin: 0.9 mg/dL (ref 0.3–1.2)
Total Protein: 8 g/dL (ref 6.5–8.1)

## 2021-09-15 LAB — CBC WITH DIFFERENTIAL (CANCER CENTER ONLY)
Abs Immature Granulocytes: 0.02 10*3/uL (ref 0.00–0.07)
Basophils Absolute: 0 10*3/uL (ref 0.0–0.1)
Basophils Relative: 1 %
Eosinophils Absolute: 0.3 10*3/uL (ref 0.0–0.5)
Eosinophils Relative: 4 %
HCT: 40.7 % (ref 36.0–46.0)
Hemoglobin: 13 g/dL (ref 12.0–15.0)
Immature Granulocytes: 0 %
Lymphocytes Relative: 25 %
Lymphs Abs: 1.7 10*3/uL (ref 0.7–4.0)
MCH: 29.3 pg (ref 26.0–34.0)
MCHC: 31.9 g/dL (ref 30.0–36.0)
MCV: 91.9 fL (ref 80.0–100.0)
Monocytes Absolute: 0.4 10*3/uL (ref 0.1–1.0)
Monocytes Relative: 6 %
Neutro Abs: 4.4 10*3/uL (ref 1.7–7.7)
Neutrophils Relative %: 64 %
Platelet Count: 176 10*3/uL (ref 150–400)
RBC: 4.43 MIL/uL (ref 3.87–5.11)
RDW: 12.7 % (ref 11.5–15.5)
WBC Count: 6.8 10*3/uL (ref 4.0–10.5)
nRBC: 0 % (ref 0.0–0.2)

## 2021-09-15 NOTE — Progress Notes (Signed)
[place]   [phone] [phone]   Clinic Follow up Note   Patient Care Team: [name], DO as PCP - General (Family Medicine) [name], MD as Consulting Physician (General Surgery) Truitt Merle, MD as Consulting Physician (Hematology) Gery Pray, MD as Consulting Physician (Radiation Oncology) Alla Feeling, NP as Nurse Practitioner (Nurse Practitioner)  Date of Service:  09/15/2021  CHIEF COMPLAINT: f/u of right breast cancer  CURRENT THERAPY:  Antiestrogen therapy, started 07/2018. On exemestane since 09/2020  ASSESSMENT & PLAN:  Lisa Horton is a 76 y.o. female with   1. Malignant neoplasm of upper outer quadrant of right breast, invasive lobular carcinoma, Stage IA, pT2N0M0, G2, ER (+), PR (+), HER2 negative -Diagnosed in 01/2018. S/p right breast lumpectomy and adjuvant radiation. Oncotype score showed low risk. -She started anti-estrogen therapy in 07/2018. She has tried Anastrozole, Tamoxifen, then Letrozole but switched due to AE's (arthrlagia, vaginal itching, insomnia, nightmares). Despite continued insomnia and nightmares, she wishes to continue exemestane to complete 5 years, until 07/2023  -most recent mammogram on 02/20/21 was negative -she is clinically stable, no concerns for recurrence. continue surveillance and anti-estrogen     2. Osteopenia -Her 01/2017 and 02/2019 DEXA's showed Osteopenia  -She did not tolerate Tamoxifen well and is currently on AI which can weaken her bone. she declined zometa or prolia in the past  -continue vitamin D and weight bearing exercise, she gets constipated from calcium -repeat DEXA due now; I ordered again today.   3. Joint pain -secondary to antiestrogen therapy  -worse in her left foot and achilles tendon.  -improved on exemestane (worse on anastrozole)   4. Insomnia, nightmares  -she initially developed insomnia on letrozole. She also notes having nightmares that she acts  out at least once per week. -has not improved on exemestane but willing to tolerate and continue    PLAN: -continue exemestane -DEXA and mammogram at [facility] in 01/2022 -lab and f/u with NP Lacie in 6 months   No problem-specific Assessment & Plan notes found for this encounter.   SUMMARY OF ONCOLOGIC HISTORY: Oncology History Overview Note  Cancer Staging Malignant neoplasm of upper-outer quadrant of right breast in female, estrogen receptor positive ([place]) Staging form: Breast, AJCC 8th Edition - Clinical stage from 01/04/2018: Stage IA (cT1c, cN0, cM0, G2, ER+, PR+, HER2-) - Signed by Truitt Merle, MD on 01/11/2018 - Pathologic: Stage IA (pT2, pN0, cM0, G2, ER+, PR+, HER2-) - Signed by Nicholas Lose, MD on 06/21/2018     Malignant neoplasm of upper-outer quadrant of right breast in female, estrogen receptor positive ([place])  12/28/2017 Mammogram   Bilateral diagnostic mammography with tomography and right breast ultrasonography at [facility] on 12/28/2017 showing: The irregular architectural distortion in the right breast upper outer quadrant posterior depth is indeterminate. The architectural distortion in the right breast upper outer quadrant middle depth is indeterminate.    01/03/2018 Pathology Results   Right needle core biopsy with pathology showing: Breast, right, needle core biopsy, (A) 11 o'clock with invasive mammary carcinoma, grade I-II. Breast, right, needle core biopsy, (B) 11 o'clock with invasive mammary carcinoma, grade I-II. Prognostic indicators significant for: ER, 60% positive with moderate staining intensity and PR, 90% positive with strong staining intensity. Proliferation marker Ki67 at 1%. HER2 negative.   01/04/2018 Cancer Staging   Staging form: Breast, AJCC 8th Edition - Clinical stage from 01/04/2018: Stage IA (cT1c, cN0, cM0, G2, ER+, PR+, HER2-) - Signed by Truitt Merle,  MD on 01/11/2018    01/25/2018 Genetic Testing   The Multi-Cancer Panel offered by [facility] includes  sequencing and/or deletion duplication testing of the following 83 genes: ALK, APC, ATM, AXIN2,BAP1,  BARD1, BLM, BMPR1A, BRCA1, BRCA2, BRIP1, CASR, CDC73, CDH1, CDK4, CDKN1B, CDKN1C, CDKN2A (p14ARF), CDKN2A (p16INK4a), CEBPA, CHEK2, CTNNA1, DICER1, DIS3L2, EGFR (c.2369C>T, p.Thr790Met variant only), EPCAM (Deletion/duplication testing only), FH, FLCN, GATA2, GPC3, GREM1 (Promoter region deletion/duplication testing only), HOXB13 (c.251G>A, p.Gly84Glu), HRAS, KIT, MAX, MEN1, MET, MITF (c.952G>A, p.Glu318Lys variant only), MLH1, MSH2, MSH3, MSH6, MUTYH, NBN, NF1, NF2, NTHL1, PALB2, PDGFRA, PHOX2B, PMS2, POLD1, POLE, POT1, PRKAR1A, PTCH1, PTEN, RAD50, RAD51C, RAD51D, RB1, RECQL4, RET, RUNX1, SDHAF2, SDHA (sequence changes only), SDHB, SDHC, SDHD, SMAD4, SMARCA4, SMARCB1, SMARCE1, STK11, SUFU, TERC, TERT, TMEM127, TP53, TSC1, TSC2, VHL, WRN and WT1.   Results: No pathogenic variants identified.  A Variant of Uncertain significance in BAP1 was identified c.1066C>T (p.Arg356Trp).  The date of this test report is 01/25/2018.    03/09/2018 Surgery   Right lumpectomy: ILC grade 2, 2 foci, 2 cm, 2.5 cm, superior margin positive, 0/5 lymph nodes negative, ER 60%, PR 90%, Ki-67 1%, HER-2 negative T2N0 stage Ia   03/09/2018 Oncotype testing   Recurrence score 17 Distant risk of recurrence at 9 years with AI or Tamoxifen alone is 5% There is a <1% benefit of chemotherapy   03/31/2018 Pathology Results   Reexcision: No residual cancer   05/09/2018 - 07/08/2018 Radiation Therapy   Radiation with Dr. Sondra Come 05/09/18- 07/08/18    06/21/2018 Cancer Staging   Staging form: Breast, AJCC 8th Edition - Pathologic: Stage IA (pT2, pN0, cM0, G2, ER+, PR+, HER2-) - Signed by Nicholas Lose, MD on 06/21/2018    07/2018 -  Anti-estrogen oral therapy   Anastrozole 50m daily starting in 07/2018. Stopped in early 03/2019 due to joint pain. Switched to Tamoxifen in 08/2019. Switched to Letrozole in 04/2020 due to joint pain and vignal  itching -She was not able to try Exemestane due to copay    Survivorship   Per LCira Rue NP       INTERVAL HISTORY:  Lisa Horton here for a follow up of breast cancer. She was last seen by NP Lacie on 03/19/21. She presents to the clinic alone. She reports she is having trouble sleeping-- she notes she either doesn't sleep for up to 3 days or she sleeps too much. In addition, she also reports nightmares.   All other systems were reviewed with the patient and are negative.  MEDICAL HISTORY:  Past Medical History:  Diagnosis Date   Acid reflux    Allergic rhinitis    Arthritis    Asthma    Bruises easily    [place]    [place] cancer   Chicken pox    Colon polyp    Family history of breast cancer    Family history of pancreatic cancer    Family history of prostate cancer    Gum disease    Hypertension    Hypothyroidism    Incontinence    Measles    Myopathy    arms and legs, statin drug related   Prediabetes 06/2014    SURGICAL HISTORY: Past Surgical History:  Procedure Laterality Date   ABDOMINAL HYSTERECTOMY  1986   secondary to fibroids   BREAST LUMPECTOMY WITH RADIOACTIVE SEED AND SENTINEL LYMPH NODE BIOPSY Right 03/09/2018   Procedure: RIGHT BREAST LUMPECTOMY WITH BRACKETED RADIOACTIVE SEED AND SENTINEL LYMPH NODE BIOPSY;  Surgeon:  Rolm Bookbinder, MD;  Location: [place];  Service: General;  Laterality: Right;   BREAST SURGERY  07/09/2008   mass removal   CHOLECYSTECTOMY  03/17/11   RE-EXCISION OF BREAST LUMPECTOMY Right 03/31/2018   Procedure: RE-EXCISION OF BREAST LUMPECTOMY;  Surgeon: Rolm Bookbinder, MD;  Location: [place];  Service: General;  Laterality: Right;   SKIN TAG REMOVAL     brow and lid   THIGH / KNEE SOFT TISSUE BIOPSY  09/05/2009   TUBAL LIGATION  1980    I have reviewed the social history and family history with the patient and they are unchanged from previous note.  ALLERGIES:  is  allergic to statins, adhesive [tape], epinephrine, hydrogen peroxide, lidocaine, metrogel [metronidazole], tamoxifen, latex, neomycin-bacitracin zn-polymyx, and sulfa antibiotics.  MEDICATIONS:  Current Outpatient Medications  Medication Sig Dispense Refill   betamethasone valerate ointment (VALISONE) 0.1 % Apply 1 application topically 2 (two) times daily. Use for up to 2 weeks 30 g 0   cetirizine (ZYRTEC) 10 MG tablet Take 10 mg by mouth daily.     exemestane (AROMASIN) 25 MG tablet TAKE 1 TABLET BY MOUTH ONCE DAILY AFTER BREAKFAST 90 tablet 0   irbesartan (AVAPRO) 300 MG tablet Take 300 mg by mouth daily.      levothyroxine (SYNTHROID) 125 MCG tablet Take 125 mcg by mouth daily.     NONFORMULARY OR COMPOUNDED ITEM Vitamin E vaginal suppositories 200u/ml.  One pv three times weekly. 36 each 3   spironolactone (ALDACTONE) 50 MG tablet Take 50 mg by mouth daily. Taking 1/2 pill daily     Vitamin D, Ergocalciferol, (DRISDOL) 50000 units CAPS capsule Take 1 capsule by mouth once a week.  3   No current facility-administered medications for this visit.    PHYSICAL EXAMINATION: ECOG PERFORMANCE STATUS: 1 - Symptomatic but completely ambulatory  Vitals:   09/15/21 1410  BP: 139/69  Pulse: (!) 105  Resp: 19  Temp: 98.2 F (36.8 C)  SpO2: 97%   Wt Readings from Last 3 Encounters:  09/15/21 214 lb 12.8 oz (97.4 kg)  03/19/21 210 lb (95.3 kg)  11/18/20 220 lb (99.8 kg)     GENERAL:alert, no distress and comfortable SKIN: skin color, texture, turgor are normal, no rashes or significant lesions EYES: normal, Conjunctiva are pink and non-injected, sclera clear  NECK: supple, thyroid normal size, non-tender, without nodularity LYMPH:  no palpable lymphadenopathy in the cervical, axillary  LUNGS: clear to auscultation and percussion with normal breathing effort HEART: regular rate & rhythm and no murmurs, (+) lower extremity edema ABDOMEN:abdomen soft, non-tender and normal bowel  sounds Musculoskeletal:no cyanosis of digits and no clubbing  NEURO: alert & oriented x 3 with fluent speech, no focal motor/sensory deficits BREAST: (+) mild right breast lymphedema. No palpable mass, nodules or adenopathy bilaterally. Breast exam benign.   LABORATORY DATA:  I have reviewed the data as listed CBC Latest Ref Rng & Units 09/15/2021 03/19/2021 09/09/2020  WBC 4.0 - 10.5 K/uL 6.8 7.6 6.8  Hemoglobin 12.0 - 15.0 g/dL 13.0 12.7 11.8(L)  Hematocrit 36.0 - 46.0 % 40.7 40.0 37.7  Platelets 150 - 400 K/uL 176 199 166     CMP Latest Ref Rng & Units 09/15/2021 03/19/2021 09/09/2020  Glucose 70 - 99 mg/dL 142(H) 97 160(H)  BUN 8 - 23 mg/dL _0 Creatinine 0.44 - 1.00 mg/dL 1.10(H) 1.37(H) 1.38(H)  Sodium 135 - 145 mmol/L 139 140 139  Potassium 3.5 - 5.1 mmol/L 4.1  4.7 4.3  Chloride 98 - 111 mmol/L 103 105 107  CO2 22 - 32 mmol/L _0 Calcium 8.9 - 10.3 mg/dL 9.9 9.7 8.9  Total Protein 6.5 - 8.1 g/dL 8.0 8.1 7.9  Total Bilirubin 0.3 - 1.2 mg/dL 0.9 1.0 0.5  Alkaline Phos 38 - 126 U/L 69 64 59  AST 15 - 41 U/L _1 ALT 0 - 44 U/L _2 RADIOGRAPHIC STUDIES: I have personally reviewed the radiological images as listed and agreed with the findings in the report. No results found.    Orders Placed This Encounter  Procedures   MM Digital Screening    Standing Status:   Future    Standing Expiration Date:   09/15/2022    Scheduling Instructions:     [facility]    Order Specific Question:   Reason for Exam (SYMPTOM  OR DIAGNOSIS REQUIRED)    Answer:   screening    Order Specific Question:   Preferred imaging location?    Answer:   External   DG Bone Density    Standing Status:   Future    Standing Expiration Date:   09/15/2022    Scheduling Instructions:     [facility]    Order Specific Question:   Reason for Exam (SYMPTOM  OR DIAGNOSIS REQUIRED)    Answer:   screening    Order Specific Question:   Preferred imaging location?    Answer:   External   All  questions were answered. The patient knows to call the clinic with any problems, questions or concerns. No barriers to learning was detected. The total time spent in the appointment was 30 minutes.     Truitt Merle, MD 09/15/2021   I, Wilburn Mylar, am acting as scribe for Truitt Merle, MD.   I have reviewed the above documentation for accuracy and completeness, and I agree with the above.

## 2021-10-15 DIAGNOSIS — E119 Type 2 diabetes mellitus without complications: Secondary | ICD-10-CM | POA: Diagnosis not present

## 2021-10-15 DIAGNOSIS — E559 Vitamin D deficiency, unspecified: Secondary | ICD-10-CM | POA: Diagnosis not present

## 2021-10-15 DIAGNOSIS — I10 Essential (primary) hypertension: Secondary | ICD-10-CM | POA: Diagnosis not present

## 2021-10-15 DIAGNOSIS — E039 Hypothyroidism, unspecified: Secondary | ICD-10-CM | POA: Diagnosis not present

## 2021-10-22 DIAGNOSIS — E039 Hypothyroidism, unspecified: Secondary | ICD-10-CM | POA: Diagnosis not present

## 2021-10-22 DIAGNOSIS — E559 Vitamin D deficiency, unspecified: Secondary | ICD-10-CM | POA: Diagnosis not present

## 2021-10-22 DIAGNOSIS — I10 Essential (primary) hypertension: Secondary | ICD-10-CM | POA: Diagnosis not present

## 2021-10-22 DIAGNOSIS — E1122 Type 2 diabetes mellitus with diabetic chronic kidney disease: Secondary | ICD-10-CM | POA: Diagnosis not present

## 2021-10-22 DIAGNOSIS — Z853 Personal history of malignant neoplasm of breast: Secondary | ICD-10-CM | POA: Diagnosis not present

## 2021-11-12 DIAGNOSIS — I10 Essential (primary) hypertension: Secondary | ICD-10-CM | POA: Diagnosis not present

## 2021-11-12 DIAGNOSIS — E1122 Type 2 diabetes mellitus with diabetic chronic kidney disease: Secondary | ICD-10-CM | POA: Diagnosis not present

## 2021-12-17 ENCOUNTER — Other Ambulatory Visit: Payer: Self-pay | Admitting: Nurse Practitioner

## 2022-01-20 ENCOUNTER — Other Ambulatory Visit (HOSPITAL_BASED_OUTPATIENT_CLINIC_OR_DEPARTMENT_OTHER): Payer: Self-pay | Admitting: Obstetrics & Gynecology

## 2022-01-20 ENCOUNTER — Ambulatory Visit (INDEPENDENT_AMBULATORY_CARE_PROVIDER_SITE_OTHER): Payer: Medicare Other

## 2022-01-20 ENCOUNTER — Telehealth (HOSPITAL_BASED_OUTPATIENT_CLINIC_OR_DEPARTMENT_OTHER): Payer: Self-pay

## 2022-01-20 DIAGNOSIS — R300 Dysuria: Secondary | ICD-10-CM

## 2022-01-20 LAB — POCT URINALYSIS DIPSTICK
Bilirubin, UA: NEGATIVE
Glucose, UA: NEGATIVE
Nitrite, UA: NEGATIVE
Protein, UA: NEGATIVE
Spec Grav, UA: 1.02 (ref 1.010–1.025)
Urobilinogen, UA: 0.2 E.U./dL
pH, UA: 5 (ref 5.0–8.0)

## 2022-01-20 MED ORDER — PHENAZOPYRIDINE HCL 200 MG PO TABS: 200.0000 mg | ORAL_TABLET | Freq: Three times a day (TID) | ORAL | 0 refills | Status: DC | PRN

## 2022-01-20 MED ORDER — NITROFURANTOIN MONOHYD MACRO 100 MG PO CAPS: 100.0000 mg | ORAL_CAPSULE | Freq: Two times a day (BID) | ORAL | 0 refills | Status: DC

## 2022-01-20 NOTE — Progress Notes (Deleted)
Patient came in today with complaints of burning while urinating. Patient states that she has some itching/irritation on and around the vulvar area. I explained to patient that I would send this to Dr. [name] Heck and we will give her call back with the plan. Tbw  Please advise

## 2022-01-20 NOTE — Progress Notes (Signed)
Patient came in today with complaints of burning while urinating. Patient states that she has some itching/irritation on and around the vulvar area. I explained to patient that I would send this to Dr. [name] Heck and we will give her call back with the plan. Tbw

## 2022-01-20 NOTE — Telephone Encounter (Signed)
Patient came in today with complaints of burning while urinating. Patient states that she has some itching/irritation on and around the vulvar area. I explained to patient that I would send this to Dr. [name] Heck and we will give her call back with the plan. Tbw

## 2022-01-22 NOTE — Telephone Encounter (Signed)
Called patient to verify that she did receive medication. She did and is currently taking it.tbw

## 2022-01-23 LAB — URINE CULTURE

## 2022-01-29 DIAGNOSIS — Z170 Estrogen receptor positive status [ER+]: Secondary | ICD-10-CM | POA: Diagnosis not present

## 2022-01-29 DIAGNOSIS — C50411 Malignant neoplasm of upper-outer quadrant of right female breast: Secondary | ICD-10-CM | POA: Diagnosis not present

## 2022-02-04 DIAGNOSIS — E1122 Type 2 diabetes mellitus with diabetic chronic kidney disease: Secondary | ICD-10-CM | POA: Diagnosis not present

## 2022-02-04 DIAGNOSIS — E039 Hypothyroidism, unspecified: Secondary | ICD-10-CM | POA: Diagnosis not present

## 2022-02-04 DIAGNOSIS — I10 Essential (primary) hypertension: Secondary | ICD-10-CM | POA: Diagnosis not present

## 2022-02-10 DIAGNOSIS — E1122 Type 2 diabetes mellitus with diabetic chronic kidney disease: Secondary | ICD-10-CM | POA: Diagnosis not present

## 2022-02-10 DIAGNOSIS — N1831 Chronic kidney disease, stage 3a: Secondary | ICD-10-CM | POA: Diagnosis not present

## 2022-02-10 DIAGNOSIS — I129 Hypertensive chronic kidney disease with stage 1 through stage 4 chronic kidney disease, or unspecified chronic kidney disease: Secondary | ICD-10-CM | POA: Diagnosis not present

## 2022-02-10 DIAGNOSIS — E7800 Pure hypercholesterolemia, unspecified: Secondary | ICD-10-CM | POA: Diagnosis not present

## 2022-02-10 DIAGNOSIS — E559 Vitamin D deficiency, unspecified: Secondary | ICD-10-CM | POA: Diagnosis not present

## 2022-02-10 DIAGNOSIS — E039 Hypothyroidism, unspecified: Secondary | ICD-10-CM | POA: Diagnosis not present

## 2022-02-10 DIAGNOSIS — M7989 Other specified soft tissue disorders: Secondary | ICD-10-CM | POA: Diagnosis not present

## 2022-02-10 DIAGNOSIS — Z0000 Encounter for general adult medical examination without abnormal findings: Secondary | ICD-10-CM | POA: Diagnosis not present

## 2022-02-24 ENCOUNTER — Ambulatory Visit (HOSPITAL_BASED_OUTPATIENT_CLINIC_OR_DEPARTMENT_OTHER): Payer: Medicare Other | Admitting: Obstetrics & Gynecology

## 2022-02-24 ENCOUNTER — Other Ambulatory Visit (HOSPITAL_COMMUNITY)
Admission: RE | Admit: 2022-02-24 | Discharge: 2022-02-24 | Disposition: A | Discharge status: Home / Self Care | Payer: Medicare Other | Source: Ambulatory Visit | Attending: Obstetrics & Gynecology | Admitting: Obstetrics & Gynecology

## 2022-02-24 ENCOUNTER — Encounter (HOSPITAL_BASED_OUTPATIENT_CLINIC_OR_DEPARTMENT_OTHER): Payer: Self-pay | Admitting: Obstetrics & Gynecology

## 2022-02-24 VITALS — BP 125/73 | HR 88 | Ht 62.0 in | Wt 210.2 lb

## 2022-02-24 DIAGNOSIS — Z87440 Personal history of urinary (tract) infections: Secondary | ICD-10-CM

## 2022-02-24 DIAGNOSIS — L292 Pruritus vulvae: Secondary | ICD-10-CM | POA: Insufficient documentation

## 2022-02-24 DIAGNOSIS — N9089 Other specified noninflammatory disorders of vulva and perineum: Secondary | ICD-10-CM | POA: Diagnosis not present

## 2022-02-24 DIAGNOSIS — R82998 Other abnormal findings in urine: Secondary | ICD-10-CM

## 2022-02-24 LAB — POCT URINALYSIS DIPSTICK
Bilirubin, UA: NEGATIVE
Blood, UA: NEGATIVE
Glucose, UA: NEGATIVE
Ketones, UA: NEGATIVE
Nitrite, UA: NEGATIVE
Protein, UA: NEGATIVE
Spec Grav, UA: 1.025 (ref 1.010–1.025)
Urobilinogen, UA: 0.2 E.U./dL
pH, UA: 5.5 (ref 5.0–8.0)

## 2022-02-24 MED ORDER — TERCONAZOLE 0.4 % VA CREA: 1.0000 | TOPICAL_CREAM | Freq: Every day | VAGINAL | 0 refills | Status: DC

## 2022-02-24 NOTE — Progress Notes (Signed)
GYNECOLOGY  VISIT  CC:   vulvar itching/irritation  HPI: 76 y.o. G65P2002 Single Black or African American female here for complaint of some external vulvar itching associated with a rash.  She is feeling irritated in her gluteal cleft.  Denies vaginal bleeding.  Had e coli positive urine culture.  She does feel like this is better but wants to have urine retested.  Does have some leukocytes in urine on POCT today.  Was having some pain with urination.  This has all resolved.     Past Medical History:  Diagnosis Date  . Acid reflux   . Allergic rhinitis   . Arthritis   . Asthma   . Bruises easily   . Cancer ([place])    rt breast cancer  . Chicken pox   . Colon polyp   . Family history of breast cancer   . Family history of pancreatic cancer   . Family history of prostate cancer   . Gum disease   . Hypertension   . Hypothyroidism   . Incontinence   . Measles   . Myopathy    arms and legs, statin drug related  . Prediabetes 06/2014    MEDS:   Current Outpatient Medications on File Prior to Visit  Medication Sig Dispense Refill  . cetirizine (ZYRTEC) 10 MG tablet Take 10 mg by mouth daily.    Marland Kitchen exemestane (AROMASIN) 25 MG tablet TAKE 1 TABLET BY MOUTH ONCE DAILY AFTER BREAKFAST 90 tablet 0  . irbesartan (AVAPRO) 300 MG tablet Take 300 mg by mouth daily.     Marland Kitchen levothyroxine (SYNTHROID) 125 MCG tablet Take 125 mcg by mouth daily.    Marland Kitchen spironolactone (ALDACTONE) 50 MG tablet Take 50 mg by mouth daily. Taking 1/2 pill daily    . Vitamin D, Ergocalciferol, (DRISDOL) 50000 units CAPS capsule Take 1 capsule by mouth once a week.  3  . betamethasone valerate ointment (VALISONE) 0.1 % Apply 1 application topically 2 (two) times daily. Use for up to 2 weeks (Patient not taking: Reported on 02/24/2022) 30 g 0   No current facility-administered medications on file prior to visit.    ALLERGIES: Statins, Adhesive [tape], Epinephrine, Hydrogen peroxide, Lidocaine, Metrogel [metronidazole],  Tamoxifen, Latex, Neomycin-bacitracin zn-polymyx, and Sulfa antibiotics  SH:  single, non smoker  Review of Systems  Constitutional: Negative.   Genitourinary:        Vaginal discharge    PHYSICAL EXAMINATION:    BP 125/73 (BP Location: Left Arm, Patient Position: Sitting, Cuff Size: Large)   Pulse 88   Ht '5\' 2"'$  (1.575 m) Comment: reported  Wt 210 lb 3.2 oz (95.3 kg)   LMP 08/03/1984 (Approximate)   BMI 38.45 kg/m     General appearance: alert, cooperative and appears stated age Abdomen: soft, non-tender; bowel sounds normal; no masses,  no organomegaly Lymph:  no inguinal LAD noted  Pelvic: External genitalia:  no lesions              Urethra:  normal appearing urethra with no masses, tenderness or lesions              Bartholins and Skenes: normal                 Vagina: normal appearing vagina with normal color and discharge, no lesions              Cervix: absent              Bimanual Exam:  Uterus:  uterus absent              Adnexa: {CHL AMB PHY EX ADNEXA NO MASS DEFAULT:[phone]::"no mass, fullness, tenderness"}              Rectovaginal: {yes no:[id number]}.  Confirms.              Anus:  normal sphincter tone, no lesions  Chaperone, ***, CMA, was present for exam.  Assessment/Plan: There are no diagnoses linked to this encounter.

## 2022-02-25 LAB — URINE CULTURE

## 2022-02-25 LAB — CERVICOVAGINAL ANCILLARY ONLY
Candida Glabrata: NEGATIVE
Candida Vaginitis: NEGATIVE
Comment: NEGATIVE
Comment: NEGATIVE

## 2022-02-26 DIAGNOSIS — Z1231 Encounter for screening mammogram for malignant neoplasm of breast: Secondary | ICD-10-CM | POA: Diagnosis not present

## 2022-02-26 LAB — TESTOSTERONE, TOTAL, LC/MS/MS: Testosterone, total: 17.9 ng/dL (ref 7.0–40.0)

## 2022-02-26 MED ORDER — BETAMETHASONE VALERATE 0.1 % EX OINT
1.0000 | TOPICAL_OINTMENT | Freq: Two times a day (BID) | CUTANEOUS | 0 refills | Status: AC
Start: 2022-02-26 — End: ?

## 2022-03-02 ENCOUNTER — Encounter (HOSPITAL_BASED_OUTPATIENT_CLINIC_OR_DEPARTMENT_OTHER): Payer: Self-pay | Admitting: *Deleted

## 2022-03-05 DIAGNOSIS — M8589 Other specified disorders of bone density and structure, multiple sites: Secondary | ICD-10-CM | POA: Diagnosis not present

## 2022-03-05 DIAGNOSIS — Z780 Asymptomatic menopausal state: Secondary | ICD-10-CM | POA: Diagnosis not present

## 2022-03-09 ENCOUNTER — Other Ambulatory Visit: Payer: Self-pay | Admitting: Hematology

## 2022-03-09 NOTE — Progress Notes (Unsigned)
[place]   [phone] Fax:([phone]) [phone]   Clinic Follow up Note   Patient Care Team: Pcp, No as PCP - General Lisa Kussmaul, MD as Consulting Physician (General Surgery) Truitt Merle, MD as Consulting Physician (Hematology) Gery Pray, MD as Consulting Physician (Radiation Oncology) Alla Feeling, NP as Nurse Practitioner (Nurse Practitioner) 03/09/2022  CHIEF COMPLAINT: Follow-up right breast cancer  SUMMARY OF ONCOLOGIC HISTORY: Oncology History Overview Note  Cancer Staging Malignant neoplasm of upper-outer quadrant of right breast in female, estrogen receptor positive ([place]) Staging form: Breast, AJCC 8th Edition - Clinical stage from 01/04/2018: Stage IA (cT1c, cN0, cM0, G2, ER+, PR+, HER2-) - Signed by Truitt Merle, MD on 01/11/2018 - Pathologic: Stage IA (pT2, pN0, cM0, G2, ER+, PR+, HER2-) - Signed by Lisa Lose, MD on 06/21/2018     Malignant neoplasm of upper-outer quadrant of right breast in female, estrogen receptor positive ([place])  12/28/2017 Mammogram   Bilateral diagnostic mammography with tomography and right breast ultrasonography at [facility] on 12/28/2017 showing: The irregular architectural distortion in the right breast upper outer quadrant posterior depth is indeterminate. The architectural distortion in the right breast upper outer quadrant middle depth is indeterminate.    01/03/2018 Pathology Results   Right needle core biopsy with pathology showing: Breast, right, needle core biopsy, (A) 11 o'clock with invasive mammary carcinoma, grade I-II. Breast, right, needle core biopsy, (B) 11 o'clock with invasive mammary carcinoma, grade I-II. Prognostic indicators significant for: ER, 60% positive with moderate staining intensity and PR, 90% positive with strong staining intensity. Proliferation marker Ki67 at 1%. HER2 negative.   01/04/2018 Cancer Staging   Staging form: Breast, AJCC 8th Edition - Clinical stage from 01/04/2018: Stage IA (cT1c,  cN0, cM0, G2, ER+, PR+, HER2-) - Signed by Truitt Merle, MD on 01/11/2018   01/25/2018 Genetic Testing   The Multi-Cancer Panel offered by [facility] includes sequencing and/or deletion duplication testing of the following 83 genes: ALK, APC, ATM, AXIN2,BAP1,  BARD1, BLM, BMPR1A, BRCA1, BRCA2, BRIP1, CASR, CDC73, CDH1, CDK4, CDKN1B, CDKN1C, CDKN2A (p14ARF), CDKN2A (p16INK4a), CEBPA, CHEK2, CTNNA1, DICER1, DIS3L2, EGFR (c.2369C>T, p.Thr790Met variant only), EPCAM (Deletion/duplication testing only), FH, FLCN, GATA2, GPC3, GREM1 (Promoter region deletion/duplication testing only), HOXB13 (c.251G>A, p.Gly84Glu), HRAS, KIT, MAX, MEN1, MET, MITF (c.952G>A, p.Glu318Lys variant only), MLH1, MSH2, MSH3, MSH6, MUTYH, NBN, NF1, NF2, NTHL1, PALB2, PDGFRA, PHOX2B, PMS2, POLD1, POLE, POT1, PRKAR1A, PTCH1, PTEN, RAD50, RAD51C, RAD51D, RB1, RECQL4, RET, RUNX1, SDHAF2, SDHA (sequence changes only), SDHB, SDHC, SDHD, SMAD4, SMARCA4, SMARCB1, SMARCE1, STK11, SUFU, TERC, TERT, TMEM127, TP53, TSC1, TSC2, VHL, WRN and WT1.   Results: No pathogenic variants identified.  A Variant of Uncertain significance in BAP1 was identified c.1066C>T (p.Arg356Trp).  The date of this test report is 01/25/2018.    03/09/2018 Surgery   Right lumpectomy: ILC grade 2, 2 foci, 2 cm, 2.5 cm, superior margin positive, 0/5 lymph nodes negative, ER 60%, PR 90%, Ki-67 1%, HER-2 negative T2N0 stage Ia   03/09/2018 Oncotype testing   Recurrence score 17 Distant risk of recurrence at 9 years with AI or Tamoxifen alone is 5% There is a <1% benefit of chemotherapy   03/31/2018 Pathology Results   Reexcision: No residual cancer   05/09/2018 - 07/08/2018 Radiation Therapy   Radiation with Dr. Sondra Come 05/09/18- 07/08/18    06/21/2018 Cancer Staging   Staging form: Breast, AJCC 8th Edition - Pathologic: Stage IA (pT2, pN0, cM0, G2, ER+, PR+, HER2-) - Signed by Lisa Lose, MD on 06/21/2018  07/2018 -  Anti-estrogen oral therapy   Anastrozole 37m daily starting  in 07/2018. Stopped in early 03/2019 due to joint pain. Switched to Tamoxifen in 08/2019. Switched to Letrozole in 04/2020 due to joint pain and vignal itching -She was not able to try Exemestane due to copay    Survivorship   Per LCira Rue NP      CURRENT THERAPY: Antiestrogen therapy, started 07/2018. On exemestane since 09/2020  INTERVAL HISTORY: Ms. BBehlerreturns for follow-up as scheduled, last seen by Dr. FBurr Medico2/13/2023.  Mammogram 02/26/2022 was benign.  She continues exemestane.   REVIEW OF SYSTEMS:   Constitutional: Denies fevers, chills or abnormal weight loss Eyes: Denies blurriness of vision Ears, nose, mouth, throat, and face: Denies mucositis or sore throat Respiratory: Denies cough, dyspnea or wheezes Cardiovascular: Denies palpitation, chest discomfort or lower extremity swelling Gastrointestinal:  Denies nausea, heartburn or change in bowel habits Skin: Denies abnormal skin rashes Lymphatics: Denies new lymphadenopathy or easy bruising Neurological:Denies numbness, tingling or new weaknesses Behavioral/Psych: Mood is stable, no new changes  All other systems were reviewed with the patient and are negative.  MEDICAL HISTORY:  Past Medical History:  Diagnosis Date   Acid reflux    Allergic rhinitis    Arthritis    Asthma    Bruises easily    [place]    [place] cancer   Chicken pox    Colon polyp    Family history of breast cancer    Family history of pancreatic cancer    Family history of prostate cancer    Gum disease    Hypertension    Hypothyroidism    Incontinence    Measles    Myopathy    arms and legs, statin drug related   Prediabetes 06/2014    SURGICAL HISTORY: Past Surgical History:  Procedure Laterality Date   ABDOMINAL HYSTERECTOMY  1986   secondary to fibroids   BREAST LUMPECTOMY WITH RADIOACTIVE SEED AND SENTINEL LYMPH NODE BIOPSY Right 03/09/2018   Procedure: RIGHT BREAST LUMPECTOMY WITH BRACKETED RADIOACTIVE SEED AND  SENTINEL LYMPH NODE BIOPSY;  Surgeon: WRolm Bookbinder MD;  [facility]  Service: General;  Laterality: Right;   BREAST SURGERY  07/09/2008   mass removal   CHOLECYSTECTOMY  03/17/11   RE-EXCISION OF BREAST LUMPECTOMY Right 03/31/2018   Procedure: RE-EXCISION OF BREAST LUMPECTOMY;  Surgeon: WRolm Bookbinder MD;  [facility]  Service: General;  Laterality: Right;   SKIN TAG REMOVAL     brow and lid   THIGH / KNEE SOFT TISSUE BIOPSY  09/05/2009   [place]   I have reviewed the social history and family history with the patient and they are unchanged from previous note.  ALLERGIES:  is allergic to statins, adhesive [tape], epinephrine, hydrogen peroxide, lidocaine, metrogel [metronidazole], tamoxifen, latex, neomycin-bacitracin zn-polymyx, and sulfa antibiotics.  MEDICATIONS:  Current Outpatient Medications  Medication Sig Dispense Refill   betamethasone valerate ointment (VALISONE) 0.1 % Apply 1 Application topically 2 (two) times daily. Use for up to 2 weeks 30 g 0   cetirizine (ZYRTEC) 10 MG tablet Take 10 mg by mouth daily.     exemestane (AROMASIN) 25 MG tablet TAKE 1 TABLET BY MOUTH ONCE DAILY AFTER BREAKFAST 90 tablet 0   irbesartan (AVAPRO) 300 MG tablet Take 300 mg by mouth daily.      levothyroxine (SYNTHROID) 125 MCG tablet Take 125 mcg by mouth daily.     spironolactone (  ALDACTONE) 50 MG tablet Take 50 mg by mouth daily. Taking 1/2 pill daily     terconazole (TERAZOL 7) 0.4 % vaginal cream Place 1 applicator vaginally at bedtime. Apply externally as directed as well. 45 g 0   Vitamin D, Ergocalciferol, (DRISDOL) 50000 units CAPS capsule Take 1 capsule by mouth once a week.  3   No current facility-administered medications for this visit.    PHYSICAL EXAMINATION: ECOG PERFORMANCE STATUS: {CHL ONC ECOG PS:[phone]}  There were no vitals filed for this visit. There were no vitals filed for this  visit.  GENERAL:alert, no distress and comfortable SKIN: skin color, texture, turgor are normal, no rashes or significant lesions EYES: normal, Conjunctiva are pink and non-injected, sclera clear OROPHARYNX:no exudate, no erythema and lips, buccal mucosa, and tongue normal  NECK: supple, thyroid normal size, non-tender, without nodularity LYMPH:  no palpable lymphadenopathy in the cervical, axillary or inguinal LUNGS: clear to auscultation and percussion with normal breathing effort HEART: regular rate & rhythm and no murmurs and no lower extremity edema ABDOMEN:abdomen soft, non-tender and normal bowel sounds Musculoskeletal:no cyanosis of digits and no clubbing  NEURO: alert & oriented x 3 with fluent speech, no focal motor/sensory deficits  LABORATORY DATA:  I have reviewed the data as listed    Latest Ref Rng & Units 09/15/2021    1:52 PM 03/19/2021   12:06 PM 09/09/2020    1:49 PM  CBC  WBC 4.0 - 10.5 K/uL 6.8  7.6  6.8   Hemoglobin 12.0 - 15.0 g/dL 13.0  12.7  11.8   Hematocrit 36.0 - 46.0 % 40.7  40.0  37.7   Platelets 150 - 400 K/uL 176  199  166         Latest Ref Rng & Units 09/15/2021    1:52 PM 03/19/2021   12:06 PM 09/09/2020    1:49 PM  CMP  Glucose 70 - 99 mg/dL 142  97  160   BUN 8 - 23 mg/dL '16  16  13   ' Creatinine 0.44 - 1.00 mg/dL 1.10  1.37  1.38   Sodium 135 - 145 mmol/L 139  140  139   Potassium 3.5 - 5.1 mmol/L 4.1  4.7  4.3   Chloride 98 - 111 mmol/L 103  105  107   CO2 22 - 32 mmol/L '29  25  25   ' Calcium 8.9 - 10.3 mg/dL 9.9  9.7  8.9   Total Protein 6.5 - 8.1 g/dL 8.0  8.1  7.9   Total Bilirubin 0.3 - 1.2 mg/dL 0.9  1.0  0.5   Alkaline Phos 38 - 126 U/L 69  64  59   AST 15 - 41 U/L '17  17  27   ' ALT 0 - 44 U/L '15  19  21       ' RADIOGRAPHIC STUDIES: I have personally reviewed the radiological images as listed and agreed with the findings in the report. No results found.   ASSESSMENT & PLAN:  No problem-specific Assessment & Plan notes found for  this encounter.   No orders of the defined types were placed in this encounter.  All questions were answered. The patient knows to call the clinic with any problems, questions or concerns. No barriers to learning was detected. I spent {CHL ONC TIME VISIT - MBBUY:[id number]} counseling the patient face to face. The total time spent in the appointment was {CHL ONC TIME VISIT - FMMCR:[id number]} and more than 50% was on counseling  and review of test results     Alla Feeling, NP 03/09/22

## 2022-03-12 ENCOUNTER — Other Ambulatory Visit: Payer: Self-pay

## 2022-03-12 ENCOUNTER — Encounter: Payer: Self-pay | Admitting: Nurse Practitioner

## 2022-03-12 ENCOUNTER — Inpatient Hospital Stay: Payer: Medicare Other | Attending: Nurse Practitioner

## 2022-03-12 ENCOUNTER — Inpatient Hospital Stay: Payer: Medicare Other | Admitting: Nurse Practitioner

## 2022-03-12 VITALS — BP 148/64 | HR 83 | Temp 98.6°F | Resp 18 | Wt 210.1 lb

## 2022-03-12 DIAGNOSIS — D649 Anemia, unspecified: Secondary | ICD-10-CM | POA: Diagnosis not present

## 2022-03-12 DIAGNOSIS — Z170 Estrogen receptor positive status [ER+]: Secondary | ICD-10-CM | POA: Insufficient documentation

## 2022-03-12 DIAGNOSIS — Z79811 Long term (current) use of aromatase inhibitors: Secondary | ICD-10-CM | POA: Diagnosis not present

## 2022-03-12 DIAGNOSIS — C50411 Malignant neoplasm of upper-outer quadrant of right female breast: Secondary | ICD-10-CM | POA: Diagnosis not present

## 2022-03-12 DIAGNOSIS — M8580 Other specified disorders of bone density and structure, unspecified site: Secondary | ICD-10-CM | POA: Insufficient documentation

## 2022-03-12 LAB — CBC WITH DIFFERENTIAL (CANCER CENTER ONLY)
Abs Immature Granulocytes: 0.02 10*3/uL (ref 0.00–0.07)
Basophils Absolute: 0 10*3/uL (ref 0.0–0.1)
Basophils Relative: 1 %
Eosinophils Absolute: 0.3 10*3/uL (ref 0.0–0.5)
Eosinophils Relative: 4 %
HCT: 38.5 % (ref 36.0–46.0)
Hemoglobin: 12.6 g/dL (ref 12.0–15.0)
Immature Granulocytes: 0 %
Lymphocytes Relative: 26 %
Lymphs Abs: 2 10*3/uL (ref 0.7–4.0)
MCH: 30 pg (ref 26.0–34.0)
MCHC: 32.7 g/dL (ref 30.0–36.0)
MCV: 91.7 fL (ref 80.0–100.0)
Monocytes Absolute: 0.5 10*3/uL (ref 0.1–1.0)
Monocytes Relative: 6 %
Neutro Abs: 5 10*3/uL (ref 1.7–7.7)
Neutrophils Relative %: 63 %
Platelet Count: 186 10*3/uL (ref 150–400)
RBC: 4.2 MIL/uL (ref 3.87–5.11)
RDW: 13 % (ref 11.5–15.5)
WBC Count: 7.8 10*3/uL (ref 4.0–10.5)
nRBC: 0 % (ref 0.0–0.2)

## 2022-03-12 LAB — CMP (CANCER CENTER ONLY)
ALT: 16 U/L (ref 0–44)
AST: 16 U/L (ref 15–41)
Albumin: 4.3 g/dL (ref 3.5–5.0)
Alkaline Phosphatase: 66 U/L (ref 38–126)
Anion gap: 6 (ref 5–15)
BUN: 13 mg/dL (ref 8–23)
CO2: 28 mmol/L (ref 22–32)
Calcium: 9.7 mg/dL (ref 8.9–10.3)
Chloride: 104 mmol/L (ref 98–111)
Creatinine: 1.09 mg/dL — ABNORMAL HIGH (ref 0.44–1.00)
GFR, Estimated: 54 mL/min — ABNORMAL LOW (ref >60)
Glucose, Bld: 141 mg/dL — ABNORMAL HIGH (ref 70–99)
Potassium: 4.5 mmol/L (ref 3.5–5.1)
Sodium: 138 mmol/L (ref 135–145)
Total Bilirubin: 0.7 mg/dL (ref 0.3–1.2)
Total Protein: 8.5 g/dL — ABNORMAL HIGH (ref 6.5–8.1)

## 2022-03-12 MED ORDER — EXEMESTANE 25 MG PO TABS: ORAL_TABLET | ORAL | 3 refills | Status: DC

## 2022-04-26 ENCOUNTER — Ambulatory Visit (HOSPITAL_COMMUNITY)
Admission: RE | Admit: 2022-04-26 | Discharge: 2022-04-26 | Disposition: A | Discharge status: Home / Self Care | Payer: Medicare Other | Source: Ambulatory Visit | Attending: Internal Medicine | Admitting: Internal Medicine

## 2022-04-26 ENCOUNTER — Encounter (HOSPITAL_COMMUNITY): Payer: Self-pay

## 2022-04-26 VITALS — BP 117/70 | HR 86 | Temp 98.9°F | Resp 12 | Ht 62.0 in | Wt 210.0 lb

## 2022-04-26 DIAGNOSIS — J069 Acute upper respiratory infection, unspecified: Secondary | ICD-10-CM | POA: Diagnosis not present

## 2022-04-26 DIAGNOSIS — R0981 Nasal congestion: Secondary | ICD-10-CM

## 2022-04-26 MED ORDER — BENZONATATE 100 MG PO CAPS: 100.0000 mg | ORAL_CAPSULE | Freq: Three times a day (TID) | ORAL | 0 refills | Status: DC

## 2022-04-26 MED ORDER — AMOXICILLIN-POT CLAVULANATE 875-125 MG PO TABS: 1.0000 | ORAL_TABLET | Freq: Two times a day (BID) | ORAL | 0 refills | Status: DC

## 2022-04-26 NOTE — Discharge Instructions (Addendum)
You likely have a post viral bacterial upper respiratory tract infection.  I would like for you to start taking Augmentin antibiotic twice daily for the next 7 days.  Take this medicine with food to avoid stomach upset and diarrhea.  If you begin to experience diarrhea, you may purchase an over-the-counter probiotic to help with this.  Take tessalon pearles every 8 hours as needed for cough.  You may take tylenol 1,'000mg'$  every 6 hours for any facial pain or headache you may experience.  You may place a humidifier in your room at night to help with cough.   If you develop any new or worsening symptoms, please return.  If your symptoms are severe, please go to the emergency room.  Follow-up with your primary care provider for further evaluation and management of your symptoms as well as ongoing wellness visits.  I hope you feel better!

## 2022-04-26 NOTE — ED Provider Notes (Signed)
[place]    CSN: [id number] Arrival date & time: 04/26/22  1241      History   Chief Complaint Chief Complaint  Patient presents with   Facial Pain    HPI Lisa Horton is a 76 y.o. female.   Patient presents urgent care for evaluation of productive cough and nasal congestion that have been present for the last 10 days since April 15, 2022.  Symptoms have not responded well to over-the-counter Robitussin and Tylenol.  Cough is productive with green/yellow phlegm.  Nasal congestion is also sick and green/white.  Patient reports intermittent pain to her teeth as well as facial discomfort.  Denies headache, nausea, vomiting, fever/chills, body aches, sore throat, neck pain, and dizziness.  Denies recent antibiotic use, known sick contacts, and chronic respiratory problems.  She is a former smoker and quit smoking in 2010.  Denies shortness of breath, chest pain, weakness, fatigue, loss of taste and smell, decreased appetite, and abdominal pain.  No other aggravating or relieving factors identified at this time for patient's symptoms.     Past Medical History:  Diagnosis Date   Acid reflux    Allergic rhinitis    Arthritis    Asthma    Bruises easily    Cancer ([place])    rt breast cancer   Chicken pox    Colon polyp    Family history of breast cancer    Family history of pancreatic cancer    Family history of prostate cancer    Gum disease    Hypertension    Hypothyroidism    Incontinence    Measles    Myopathy    arms and legs, statin drug related   Prediabetes 06/2014    Patient Active Problem List   Diagnosis Date Noted   High cholesterol 11/19/2020   Obesity 11/19/2020   Osteopenia 11/19/2020   Type 2 diabetes mellitus without complications ([place]) 92/95/7473   Genetic testing 02/04/2018   Family history of pancreatic cancer    Family history of prostate cancer    Family history of breast cancer    Malignant neoplasm of upper-outer quadrant of  right breast in female, estrogen receptor positive ([place]) 01/07/2018   Moderate persistent asthma without complication 40/37/0964   Allergic rhinitis 11/28/2015   HYPOTHYROIDISM 10/02/2010   HYPERTENSION 10/02/2010   G E R D 10/02/2010   OSTEOARTHRITIS 10/02/2010    Past Surgical History:  Procedure Laterality Date   ABDOMINAL HYSTERECTOMY  1986   secondary to fibroids   BREAST LUMPECTOMY WITH RADIOACTIVE SEED AND SENTINEL LYMPH NODE BIOPSY Right 03/09/2018   Procedure: RIGHT BREAST LUMPECTOMY WITH BRACKETED RADIOACTIVE SEED AND SENTINEL LYMPH NODE BIOPSY;  Surgeon: Rolm Bookbinder, MD;  Location: [facility];  Service: General;  Laterality: Right;   BREAST SURGERY  07/09/2008   mass removal   CHOLECYSTECTOMY  03/17/11   RE-EXCISION OF BREAST LUMPECTOMY Right 03/31/2018   Procedure: RE-EXCISION OF BREAST LUMPECTOMY;  Surgeon: Rolm Bookbinder, MD;  Location: [facility];  Service: General;  Laterality: Right;   SKIN TAG REMOVAL     brow and lid   THIGH / KNEE SOFT TISSUE BIOPSY  09/05/2009   TUBAL LIGATION  1980    OB History     Gravida  2   Para  2   Term  2   Preterm  0   AB  0   Living  2      SAB  0  IAB  0   Ectopic  0   Multiple  0   Live Births  2            Home Medications    Prior to Admission medications   Medication Sig Start Date End Date Taking? Authorizing Provider  amoxicillin-clavulanate (AUGMENTIN) 875-125 MG tablet Take 1 tablet by mouth every 12 (twelve) hours. 04/26/22  Yes Talbot Grumbling, FNP  benzonatate (TESSALON) 100 MG capsule Take 1 capsule (100 mg total) by mouth every 8 (eight) hours. 04/26/22  Yes Talbot Grumbling, FNP  betamethasone valerate ointment (VALISONE) 0.1 % Apply 1 Application topically 2 (two) times daily. Use for up to 2 weeks 02/26/22   Megan Salon, MD  cetirizine (ZYRTEC) 10 MG tablet Take 10 mg by mouth daily.    [provider]  exemestane (AROMASIN) 25 MG  tablet TAKE 1 TABLET BY MOUTH ONCE DAILY AFTER BREAKFAST 03/12/22   Alla Feeling, NP  irbesartan (AVAPRO) 300 MG tablet Take 300 mg by mouth daily.  10/28/13   [provider]  levothyroxine (SYNTHROID) 125 MCG tablet Take 125 mcg by mouth daily. 05/02/20   [provider]  spironolactone (ALDACTONE) 50 MG tablet Take 50 mg by mouth daily. Taking 1/2 pill daily    [provider]  terconazole (TERAZOL 7) 0.4 % vaginal cream Place 1 applicator vaginally at bedtime. Apply externally as directed as well. 02/24/22   Megan Salon, MD  Vitamin D, Ergocalciferol, (DRISDOL) 50000 units CAPS capsule Take 1 capsule by mouth once a week. 12/02/17   [provider]    Family History Family History  Problem Relation Age of Onset   Hypertension Mother    Pancreatic cancer Mother 78   Hypertension Father    Prostate cancer Father 35   Emphysema Father    Hypertension Sister    Breast cancer Sister        dx >50, caught very early   Hypertension Brother    Alzheimer's disease Brother    Hypertension Sister    Heart failure Brother    Hypertension Brother    Breast cancer Daughter 6       BRCA1/2 negative, never had panel testing   Allergies Sister    Allergies Brother    Throat cancer Maternal Uncle     Social History Social History   Tobacco Use   Smoking status: Former    Packs/day: 1.00    Years: 48.00    Total pack years: 48.00    Types: Cigarettes    Quit date: 08/03/2008    Years since quitting: 13.7   Smokeless tobacco: Never  Vaping Use   Vaping Use: Never used  Substance Use Topics   Alcohol use: Yes    Alcohol/week: 0.0 - 1.0 standard drinks of alcohol   Drug use: No     Allergies   Statins, Adhesive [tape], Epinephrine, Hydrogen peroxide, Lidocaine, Metrogel [metronidazole], Tamoxifen, Latex, Neomycin-bacitracin zn-polymyx, and Sulfa antibiotics   Review of Systems Review of Systems Per HPI  Physical Exam Triage Vital Signs ED  Triage Vitals  Enc Vitals Group     BP 04/26/22 1255 117/70     Pulse Rate 04/26/22 1255 86     Resp 04/26/22 1255 12     Temp 04/26/22 1255 98.9 F (37.2 C)     Temp Source 04/26/22 1255 Oral     SpO2 04/26/22 1255 96 %     Weight 04/26/22 1258 210 lb (95.3  kg)     Height 04/26/22 1258 '5\' 2"'  (1.575 m)     Head Circumference --      Peak Flow --      Pain Score 04/26/22 1257 0     Pain Loc --      Pain Edu? --      Excl. in [place]? --    No data found.  Updated Vital Signs BP 117/70 (BP Location: Left Arm)   Pulse 86   Temp 98.9 F (37.2 C) (Oral)   Resp 12   Ht '5\' 2"'  (1.575 m)   Wt 210 lb (95.3 kg)   LMP 08/03/1984 (Approximate)   SpO2 96%   BMI 38.41 kg/m   Visual Acuity Right Eye Distance:   Left Eye Distance:   Bilateral Distance:    Right Eye Near:   Left Eye Near:    Bilateral Near:     Physical Exam Vitals and nursing note reviewed.  Constitutional:      Appearance: Normal appearance. She is obese. She is not ill-appearing or toxic-appearing.  HENT:     Head: Normocephalic and atraumatic.     Right Ear: Hearing, tympanic membrane, ear canal and external ear normal.     Left Ear: Hearing, tympanic membrane, ear canal and external ear normal.     Nose: Congestion present.     Right Turbinates: Swollen.     Left Turbinates: Swollen.     Right Sinus: Maxillary sinus tenderness present.     Left Sinus: Maxillary sinus tenderness present.     Mouth/Throat:     Lips: Pink.     Mouth: Mucous membranes are moist.     Pharynx: No oropharyngeal exudate or posterior oropharyngeal erythema.     Comments: Postnasal drainage that is thick and white present to the posterior oropharynx.  Airway is intact.  Eyes:     General: Lids are normal. Vision grossly intact. Gaze aligned appropriately.        Right eye: No discharge.        Left eye: No discharge.     Extraocular Movements: Extraocular movements intact.     Conjunctiva/sclera: Conjunctivae normal.     Pupils:  Pupils are equal, round, and reactive to light.  Cardiovascular:     Rate and Rhythm: Normal rate and regular rhythm.     Heart sounds: Normal heart sounds, S1 normal and S2 normal.  Pulmonary:     Effort: Pulmonary effort is normal. No respiratory distress.     Breath sounds: Normal breath sounds and air entry.     Comments: Clear to auscultation bilaterally without adventitious sounds.  No respiratory distress. Musculoskeletal:     Cervical back: Normal range of motion and neck supple. No tenderness.  Lymphadenopathy:     Cervical: Cervical adenopathy present.  Skin:    General: Skin is warm and dry.     Capillary Refill: Capillary refill takes less than 2 seconds.     Findings: No rash.  Neurological:     General: No focal deficit present.     Mental Status: She is alert and oriented to person, place, and time. Mental status is at baseline.     Cranial Nerves: No dysarthria or facial asymmetry.  Psychiatric:        Mood and Affect: Mood normal.        Speech: Speech normal.        Behavior: Behavior normal.        Thought Content: Thought content  normal.        Judgment: Judgment normal.      UC Treatments / Results  Labs (all labs ordered are listed, but only abnormal results are displayed) Labs Reviewed - No data to display  EKG   Radiology No results found.  Procedures Procedures (including critical care time)  Medications Ordered in UC Medications - No data to display  Initial Impression / Assessment and Plan / UC Course  I have reviewed the triage vital signs and the nursing notes.  Pertinent labs & imaging results that were available during my care of the patient were reviewed by me and considered in my medical decision making (see chart for details).   1.  Viral upper respiratory tract infection with cough and nasal congestion Symptoms have been present for 10 days or greater and therefore we will cover for postviral bacterial sinusitis with Augmentin  antibiotic.  Patient to take this twice daily for the next 7 days.  Also advised to take Tessalon Perles every 8 hours as needed for coughing.  She is to increase fluid intake and rest so that she is able to recover adequately.  Advised to purchase an over-the-counter probiotic if she experiences diarrhea related to antibiotic use.  Denies allergies to penicillin antibiotics and has been advised to return to urgent care if she experiences persistent diarrhea despite cessation of antibiotic therapy after she finishes the course for possible GI evaluation.  Humidifier use in bedroom at nighttime advised as well.  Deferred imaging based on stable cardiopulmonary exam and hemodynamically stable vital signs in clinic.  She is clinically well-appearing and has been given strict ED return precautions.   Discussed physical exam and available lab work findings in clinic with patient.  Counseled patient regarding appropriate use of medications and potential side effects for all medications recommended or prescribed today. Discussed red flag signs and symptoms of worsening condition,when to call the PCP office, return to urgent care, and when to seek higher level of care in the emergency department. Patient verbalizes understanding and agreement with plan. All questions answered. Patient discharged in stable condition.      Final Clinical Impressions(s) / UC Diagnoses   Final diagnoses:  Viral upper respiratory tract infection with cough  Nasal congestion     Discharge Instructions      You likely have a post viral bacterial upper respiratory tract infection.  I would like for you to start taking Augmentin antibiotic twice daily for the next 7 days.  Take this medicine with food to avoid stomach upset and diarrhea.  If you begin to experience diarrhea, you may purchase an over-the-counter probiotic to help with this.  Take tessalon pearles every 8 hours as needed for cough.  You may take tylenol  1,047m every 6 hours for any facial pain or headache you may experience.  You may place a humidifier in your room at night to help with cough.   If you develop any new or worsening symptoms, please return.  If your symptoms are severe, please go to the emergency room.  Follow-up with your primary care provider for further evaluation and management of your symptoms as well as ongoing wellness visits.  I hope you feel better!      ED Prescriptions     Medication Sig Dispense Auth. Provider   amoxicillin-clavulanate (AUGMENTIN) 875-125 MG tablet Take 1 tablet by mouth every 12 (twelve) hours. 14 tablet SJoella PrinceM, FNP   benzonatate (TESSALON) 100 MG capsule Take 1  capsule (100 mg total) by mouth every 8 (eight) hours. 21 capsule Talbot Grumbling, FNP      PDMP not reviewed this encounter.   Talbot Grumbling, [place] 04/26/22 1330

## 2022-04-26 NOTE — ED Triage Notes (Signed)
Pt is here for sinus pressure, dizziness, sneezing and coughing x1wk

## 2022-06-08 DIAGNOSIS — M7989 Other specified soft tissue disorders: Secondary | ICD-10-CM | POA: Diagnosis not present

## 2022-06-08 DIAGNOSIS — I129 Hypertensive chronic kidney disease with stage 1 through stage 4 chronic kidney disease, or unspecified chronic kidney disease: Secondary | ICD-10-CM | POA: Diagnosis not present

## 2022-06-08 DIAGNOSIS — E7800 Pure hypercholesterolemia, unspecified: Secondary | ICD-10-CM | POA: Diagnosis not present

## 2022-06-08 DIAGNOSIS — E559 Vitamin D deficiency, unspecified: Secondary | ICD-10-CM | POA: Diagnosis not present

## 2022-06-08 DIAGNOSIS — E1122 Type 2 diabetes mellitus with diabetic chronic kidney disease: Secondary | ICD-10-CM | POA: Diagnosis not present

## 2022-06-08 DIAGNOSIS — E039 Hypothyroidism, unspecified: Secondary | ICD-10-CM | POA: Diagnosis not present

## 2022-06-11 DIAGNOSIS — E039 Hypothyroidism, unspecified: Secondary | ICD-10-CM | POA: Diagnosis not present

## 2022-06-11 DIAGNOSIS — K5900 Constipation, unspecified: Secondary | ICD-10-CM | POA: Diagnosis not present

## 2022-06-11 DIAGNOSIS — I10 Essential (primary) hypertension: Secondary | ICD-10-CM | POA: Diagnosis not present

## 2022-06-11 DIAGNOSIS — Z1211 Encounter for screening for malignant neoplasm of colon: Secondary | ICD-10-CM | POA: Diagnosis not present

## 2022-06-11 DIAGNOSIS — Z86010 Personal history of colonic polyps: Secondary | ICD-10-CM | POA: Diagnosis not present

## 2022-06-15 DIAGNOSIS — Z23 Encounter for immunization: Secondary | ICD-10-CM | POA: Diagnosis not present

## 2022-06-15 DIAGNOSIS — I129 Hypertensive chronic kidney disease with stage 1 through stage 4 chronic kidney disease, or unspecified chronic kidney disease: Secondary | ICD-10-CM | POA: Diagnosis not present

## 2022-06-15 DIAGNOSIS — E1122 Type 2 diabetes mellitus with diabetic chronic kidney disease: Secondary | ICD-10-CM | POA: Diagnosis not present

## 2022-06-15 DIAGNOSIS — M8580 Other specified disorders of bone density and structure, unspecified site: Secondary | ICD-10-CM | POA: Diagnosis not present

## 2022-06-15 DIAGNOSIS — N1831 Chronic kidney disease, stage 3a: Secondary | ICD-10-CM | POA: Diagnosis not present

## 2022-06-15 DIAGNOSIS — E039 Hypothyroidism, unspecified: Secondary | ICD-10-CM | POA: Diagnosis not present

## 2022-06-16 DIAGNOSIS — H2513 Age-related nuclear cataract, bilateral: Secondary | ICD-10-CM | POA: Diagnosis not present

## 2022-06-16 DIAGNOSIS — H40053 Ocular hypertension, bilateral: Secondary | ICD-10-CM | POA: Diagnosis not present

## 2022-06-16 DIAGNOSIS — H524 Presbyopia: Secondary | ICD-10-CM | POA: Diagnosis not present

## 2022-06-16 DIAGNOSIS — H43813 Vitreous degeneration, bilateral: Secondary | ICD-10-CM | POA: Diagnosis not present

## 2022-08-13 DIAGNOSIS — I83893 Varicose veins of bilateral lower extremities with other complications: Secondary | ICD-10-CM | POA: Diagnosis not present

## 2022-08-13 DIAGNOSIS — M79661 Pain in right lower leg: Secondary | ICD-10-CM | POA: Diagnosis not present

## 2022-08-13 DIAGNOSIS — M79605 Pain in left leg: Secondary | ICD-10-CM | POA: Diagnosis not present

## 2022-08-13 DIAGNOSIS — M79604 Pain in right leg: Secondary | ICD-10-CM | POA: Diagnosis not present

## 2022-08-13 DIAGNOSIS — M79662 Pain in left lower leg: Secondary | ICD-10-CM | POA: Diagnosis not present

## 2022-08-24 DIAGNOSIS — D125 Benign neoplasm of sigmoid colon: Secondary | ICD-10-CM | POA: Diagnosis not present

## 2022-08-24 DIAGNOSIS — K635 Polyp of colon: Secondary | ICD-10-CM | POA: Diagnosis not present

## 2022-08-24 DIAGNOSIS — Z86010 Personal history of colonic polyps: Secondary | ICD-10-CM | POA: Diagnosis not present

## 2022-09-13 NOTE — Assessment & Plan Note (Deleted)
-  Her 01/2017 and 02/2019 DEXA's showed Osteopenia  -She did not tolerate Tamoxifen well and is currently on AI which can weaken her bone. she declined zometa or prolia in the past  -continue vitamin D and weight bearing exercise, she gets constipated from calcium

## 2022-09-13 NOTE — Assessment & Plan Note (Deleted)
invasive lobular carcinoma, Stage IA, pT2N0M0, G2, ER (+), PR (+), HER2 negative -Diagnosed in 01/2018. S/p right breast lumpectomy and adjuvant radiation. Oncotype score showed low risk. -She started anti-estrogen therapy in 07/2018. She has tried Anastrozole, Tamoxifen, then Letrozole but switched due to AE's (arthrlagia, vaginal itching, insomnia, nightmares). Despite continued insomnia and nightmares, she wishes to continue exemestane to complete 5 years, until 07/2023  -most recent mammogram on 02/20/21 was negative -she is clinically stable, no concerns for recurrence. continue surveillance and anti-estrogen

## 2022-09-14 ENCOUNTER — Inpatient Hospital Stay: Payer: Medicare Other | Attending: Nurse Practitioner

## 2022-09-14 ENCOUNTER — Inpatient Hospital Stay: Payer: Medicare Other | Admitting: Hematology

## 2022-09-14 DIAGNOSIS — M8580 Other specified disorders of bone density and structure, unspecified site: Secondary | ICD-10-CM

## 2022-09-14 DIAGNOSIS — Z170 Estrogen receptor positive status [ER+]: Secondary | ICD-10-CM

## 2022-09-14 NOTE — Progress Notes (Deleted)
[place]   [phone] [phone]   Clinic Follow up Note   Patient Care Team: Pcp, No as PCP - General Lisa Kussmaul, MD as Consulting Physician (General Surgery) Lisa Merle, MD as Consulting Physician (Hematology) Gery Pray, MD as Consulting Physician (Radiation Oncology) Lisa Feeling, NP as Nurse Practitioner (Nurse Practitioner)  Date of Service:  09/14/2022  CHIEF COMPLAINT: f/u of right breast cancer   CURRENT THERAPY:  Antiestrogen therapy, started 07/2018. On exemestane since 09/2020   ASSESSMENT: *** Lisa Horton is a 77 y.o. female with   No problem-specific Assessment & Plan notes found for this encounter.  ***   PLAN:   SUMMARY OF ONCOLOGIC HISTORY: Oncology History Overview Note  Cancer Staging Malignant neoplasm of upper-outer quadrant of right breast in female, estrogen receptor positive ([place]) Staging form: Breast, AJCC 8th Edition - Clinical stage from 01/04/2018: Stage IA (cT1c, cN0, cM0, G2, ER+, PR+, HER2-) - Signed by Lisa Merle, MD on 01/11/2018 - Pathologic: Stage IA (pT2, pN0, cM0, G2, ER+, PR+, HER2-) - Signed by Lisa Lose, MD on 06/21/2018     Malignant neoplasm of upper-outer quadrant of right breast in female, estrogen receptor positive ([place])  12/28/2017 Mammogram   Bilateral diagnostic mammography with tomography and right breast ultrasonography at [facility] on 12/28/2017 showing: The irregular architectural distortion in the right breast upper outer quadrant posterior depth is indeterminate. The architectural distortion in the right breast upper outer quadrant middle depth is indeterminate.    01/03/2018 Pathology Results   Right needle core biopsy with pathology showing: Breast, right, needle core biopsy, (A) 11 o'clock with invasive mammary carcinoma, grade I-II. Breast, right, needle core biopsy, (B) 11 o'clock with invasive mammary carcinoma, grade I-II. Prognostic indicators significant for: ER, 60%  positive with moderate staining intensity and PR, 90% positive with strong staining intensity. Proliferation marker Ki67 at 1%. HER2 negative.   01/04/2018 Cancer Staging   Staging form: Breast, AJCC 8th Edition - Clinical stage from 01/04/2018: Stage IA (cT1c, cN0, cM0, G2, ER+, PR+, HER2-) - Signed by Lisa Merle, MD on 01/11/2018   01/25/2018 Genetic Testing   The Multi-Cancer Panel offered by [facility] includes sequencing and/or deletion duplication testing of the following 83 genes: ALK, APC, ATM, AXIN2,BAP1,  BARD1, BLM, BMPR1A, BRCA1, BRCA2, BRIP1, CASR, CDC73, CDH1, CDK4, CDKN1B, CDKN1C, CDKN2A (p14ARF), CDKN2A (p16INK4a), CEBPA, CHEK2, CTNNA1, DICER1, DIS3L2, EGFR (c.2369C>T, p.Thr790Met variant only), EPCAM (Deletion/duplication testing only), FH, FLCN, GATA2, GPC3, GREM1 (Promoter region deletion/duplication testing only), HOXB13 (c.251G>A, p.Gly84Glu), HRAS, KIT, MAX, MEN1, MET, MITF (c.952G>A, p.Glu318Lys variant only), MLH1, MSH2, MSH3, MSH6, MUTYH, NBN, NF1, NF2, NTHL1, PALB2, PDGFRA, PHOX2B, PMS2, POLD1, POLE, POT1, PRKAR1A, PTCH1, PTEN, RAD50, RAD51C, RAD51D, RB1, RECQL4, RET, RUNX1, SDHAF2, SDHA (sequence changes only), SDHB, SDHC, SDHD, SMAD4, SMARCA4, SMARCB1, SMARCE1, STK11, SUFU, TERC, TERT, TMEM127, TP53, TSC1, TSC2, VHL, WRN and WT1.   Results: No pathogenic variants identified.  A Variant of Uncertain significance in BAP1 was identified c.1066C>T (p.Arg356Trp).  The date of this test report is 01/25/2018.    03/09/2018 Surgery   Right lumpectomy: ILC grade 2, 2 foci, 2 cm, 2.5 cm, superior margin positive, 0/5 lymph nodes negative, ER 60%, PR 90%, Ki-67 1%, HER-2 negative T2N0 stage Ia   03/09/2018 Oncotype testing   Recurrence score 17 Distant risk of recurrence at 9 years with AI or Tamoxifen alone is 5% There is a <1% benefit of chemotherapy   03/31/2018 Pathology Results   Reexcision: No residual cancer  05/09/2018 - 07/08/2018 Radiation Therapy   Radiation with Dr. Sondra Horton 05/09/18-  07/08/18    06/21/2018 Cancer Staging   Staging form: Breast, AJCC 8th Edition - Pathologic: Stage IA (pT2, pN0, cM0, G2, ER+, PR+, HER2-) - Signed by Lisa Lose, MD on 06/21/2018   07/2018 -  Anti-estrogen oral therapy   Anastrozole 30m daily starting in 07/2018. Stopped in early 03/2019 due to joint pain. Switched to Tamoxifen in 08/2019. Switched to Letrozole in 04/2020 due to joint pain and vignal itching -She was not able to try Exemestane due to copay    Survivorship   Per Lisa Rue NP       INTERVAL HISTORY: *** Lisa MARANDOLAis here for a follow up of  right breast cancer She was last seen by NP Lisa Horton on 03/12/2022 She presents to the clinic     All other systems were reviewed with the patient and are negative.  MEDICAL HISTORY:  Past Medical History:  Diagnosis Date   Acid reflux    Allergic rhinitis    Arthritis    Asthma    Bruises easily    [place] [place]    [place] cancer   Chicken pox    Colon polyp    Family history of breast cancer    Family history of pancreatic cancer    Family history of prostate cancer    Gum disease    Hypertension    Hypothyroidism    Incontinence    Measles    Myopathy    arms and legs, statin drug related   Prediabetes 06/2014    SURGICAL HISTORY: Past Surgical History:  Procedure Laterality Date   ABDOMINAL HYSTERECTOMY  1986   secondary to fibroids   BREAST LUMPECTOMY WITH RADIOACTIVE SEED AND SENTINEL LYMPH NODE BIOPSY Right 03/09/2018   Procedure: RIGHT BREAST LUMPECTOMY WITH BRACKETED RADIOACTIVE SEED AND SENTINEL LYMPH NODE BIOPSY;  Surgeon: Lisa Bookbinder MD;  [facility]  Service: General;  Laterality: Right;   BREAST SURGERY  07/09/2008   mass removal   CHOLECYSTECTOMY  03/17/11   RE-EXCISION OF BREAST LUMPECTOMY Right 03/31/2018   Procedure: RE-EXCISION OF BREAST LUMPECTOMY;  Surgeon: Lisa Bookbinder MD;  [place] [facility]  Service: General;  Laterality:  Right;   SKIN TAG REMOVAL     brow and lid   THIGH / KNEE SOFT TISSUE BIOPSY  09/05/2009   TIrving   I have reviewed the social history and family history with the patient and they are unchanged from previous note.  ALLERGIES:  is allergic to statins, adhesive [tape], epinephrine, hydrogen peroxide, lidocaine, metrogel [metronidazole], tamoxifen, latex, neomycin-bacitracin zn-polymyx, and sulfa antibiotics.  MEDICATIONS:  Current Outpatient Medications  Medication Sig Dispense Refill   amoxicillin-clavulanate (AUGMENTIN) 875-125 MG tablet Take 1 tablet by mouth every 12 (twelve) hours. 14 tablet 0   benzonatate (TESSALON) 100 MG capsule Take 1 capsule (100 mg total) by mouth every 8 (eight) hours. 21 capsule 0   betamethasone valerate ointment (VALISONE) 0.1 % Apply 1 Application topically 2 (two) times daily. Use for up to 2 weeks 30 g 0   cetirizine (ZYRTEC) 10 MG tablet Take 10 mg by mouth daily.     exemestane (AROMASIN) 25 MG tablet TAKE 1 TABLET BY MOUTH ONCE DAILY AFTER BREAKFAST 90 tablet 3   irbesartan (AVAPRO) 300 MG tablet Take 300 mg by mouth daily.      levothyroxine (SYNTHROID) 125 MCG tablet Take 125 mcg  by mouth daily.     spironolactone (ALDACTONE) 50 MG tablet Take 50 mg by mouth daily. Taking 1/2 pill daily     terconazole (TERAZOL 7) 0.4 % vaginal cream Place 1 applicator vaginally at bedtime. Apply externally as directed as well. 45 g 0   Vitamin D, Ergocalciferol, (DRISDOL) 50000 units CAPS capsule Take 1 capsule by mouth once a week.  3   No current facility-administered medications for this visit.    PHYSICAL EXAMINATION: ECOG PERFORMANCE STATUS: {CHL ONC ECOG PS:[id number]}  There were no vitals filed for this visit. Wt Readings from Last 3 Encounters:  04/26/22 210 lb (95.3 kg)  03/12/22 210 lb 1 oz (95.3 kg)  02/24/22 210 lb 3.2 oz (95.3 kg)    {Only keep what was examined. If exam not performed, can use .CEXAM } GENERAL:alert, no distress  and comfortable SKIN: skin color, texture, turgor are normal, no rashes or significant lesions EYES: normal, Conjunctiva are pink and non-injected, sclera clear {OROPHARYNX:no exudate, no erythema and lips, buccal mucosa, and tongue normal}  NECK: supple, thyroid normal size, non-tender, without nodularity LYMPH:  no palpable lymphadenopathy in the cervical, axillary {or inguinal} LUNGS: clear to auscultation and percussion with normal breathing effort HEART: regular rate & rhythm and no murmurs and no lower extremity edema ABDOMEN:abdomen soft, non-tender and normal bowel sounds Musculoskeletal:no cyanosis of digits and no clubbing  NEURO: alert & oriented x 3 with fluent speech, no focal motor/sensory deficits  LABORATORY DATA:  I have reviewed the data as listed    Latest Ref Rng & Units 03/12/2022   10:19 AM 09/15/2021    1:52 PM 03/19/2021   12:06 PM  CBC  WBC 4.0 - 10.5 K/uL 7.8  6.8  7.6   Hemoglobin 12.0 - 15.0 g/dL 12.6  13.0  12.7   Hematocrit 36.0 - 46.0 % 38.5  40.7  40.0   Platelets 150 - 400 K/uL 186  176  199         Latest Ref Rng & Units 03/12/2022   10:19 AM 09/15/2021    1:52 PM 03/19/2021   12:06 PM  CMP  Glucose 70 - 99 mg/dL 141  142  97   BUN 8 - 23 mg/dL 13  16  16   $ Creatinine 0.44 - 1.00 mg/dL 1.09  1.10  1.37   Sodium 135 - 145 mmol/L 138  139  140   Potassium 3.5 - 5.1 mmol/L 4.5  4.1  4.7   Chloride 98 - 111 mmol/L 104  103  105   CO2 22 - 32 mmol/L 28  29  25   $ Calcium 8.9 - 10.3 mg/dL 9.7  9.9  9.7   Total Protein 6.5 - 8.1 g/dL 8.5  8.0  8.1   Total Bilirubin 0.3 - 1.2 mg/dL 0.7  0.9  1.0   Alkaline Phos 38 - 126 U/L 66  69  64   AST 15 - 41 U/L 16  17  17   $ ALT 0 - 44 U/L 16  15  19       $ RADIOGRAPHIC STUDIES: I have personally reviewed the radiological images as listed and agreed with the findings in the report. No results found.    No orders of the defined types were placed in this encounter.  All questions were answered. The  patient knows to call the clinic with any problems, questions or concerns. No barriers to learning was detected. The total time spent in the appointment was {CHL  ONC TIME VISIT - [id number].     Baldemar Friday, CMA 09/14/2022   I, Audry Riles, CMA, am acting as scribe for Lisa Merle, MD.   {Add scribe attestation statement}

## 2022-10-07 DIAGNOSIS — N1831 Chronic kidney disease, stage 3a: Secondary | ICD-10-CM | POA: Diagnosis not present

## 2022-10-07 DIAGNOSIS — E039 Hypothyroidism, unspecified: Secondary | ICD-10-CM | POA: Diagnosis not present

## 2022-10-07 DIAGNOSIS — I129 Hypertensive chronic kidney disease with stage 1 through stage 4 chronic kidney disease, or unspecified chronic kidney disease: Secondary | ICD-10-CM | POA: Diagnosis not present

## 2022-10-07 DIAGNOSIS — E1122 Type 2 diabetes mellitus with diabetic chronic kidney disease: Secondary | ICD-10-CM | POA: Diagnosis not present

## 2022-10-14 DIAGNOSIS — E1122 Type 2 diabetes mellitus with diabetic chronic kidney disease: Secondary | ICD-10-CM | POA: Diagnosis not present

## 2022-10-14 DIAGNOSIS — E559 Vitamin D deficiency, unspecified: Secondary | ICD-10-CM | POA: Diagnosis not present

## 2022-10-14 DIAGNOSIS — C50919 Malignant neoplasm of unspecified site of unspecified female breast: Secondary | ICD-10-CM | POA: Diagnosis not present

## 2022-10-14 DIAGNOSIS — I129 Hypertensive chronic kidney disease with stage 1 through stage 4 chronic kidney disease, or unspecified chronic kidney disease: Secondary | ICD-10-CM | POA: Diagnosis not present

## 2022-10-14 DIAGNOSIS — N1831 Chronic kidney disease, stage 3a: Secondary | ICD-10-CM | POA: Diagnosis not present

## 2022-10-14 DIAGNOSIS — E7800 Pure hypercholesterolemia, unspecified: Secondary | ICD-10-CM | POA: Diagnosis not present

## 2022-10-14 DIAGNOSIS — E039 Hypothyroidism, unspecified: Secondary | ICD-10-CM | POA: Diagnosis not present

## 2022-12-15 DIAGNOSIS — H43813 Vitreous degeneration, bilateral: Secondary | ICD-10-CM | POA: Diagnosis not present

## 2022-12-15 DIAGNOSIS — H40053 Ocular hypertension, bilateral: Secondary | ICD-10-CM | POA: Diagnosis not present

## 2022-12-15 DIAGNOSIS — H524 Presbyopia: Secondary | ICD-10-CM | POA: Diagnosis not present

## 2022-12-15 DIAGNOSIS — H2513 Age-related nuclear cataract, bilateral: Secondary | ICD-10-CM | POA: Diagnosis not present

## 2022-12-23 ENCOUNTER — Ambulatory Visit (INDEPENDENT_AMBULATORY_CARE_PROVIDER_SITE_OTHER): Payer: Medicare Other | Admitting: Obstetrics & Gynecology

## 2022-12-23 ENCOUNTER — Other Ambulatory Visit (HOSPITAL_COMMUNITY)
Admission: RE | Admit: 2022-12-23 | Discharge: 2022-12-23 | Disposition: A | Discharge status: Home / Self Care | Payer: Medicare Other | Source: Ambulatory Visit | Attending: Obstetrics & Gynecology | Admitting: Obstetrics & Gynecology

## 2022-12-23 ENCOUNTER — Encounter (HOSPITAL_BASED_OUTPATIENT_CLINIC_OR_DEPARTMENT_OTHER): Payer: Self-pay

## 2022-12-23 VITALS — BP 140/75 | HR 97 | Ht 62.0 in | Wt 211.2 lb

## 2022-12-23 DIAGNOSIS — N898 Other specified noninflammatory disorders of vagina: Secondary | ICD-10-CM | POA: Diagnosis not present

## 2022-12-23 DIAGNOSIS — I10 Essential (primary) hypertension: Secondary | ICD-10-CM | POA: Diagnosis not present

## 2022-12-23 DIAGNOSIS — R3915 Urgency of urination: Secondary | ICD-10-CM

## 2022-12-23 DIAGNOSIS — N90811 Female genital mutilation Type I status: Secondary | ICD-10-CM | POA: Insufficient documentation

## 2022-12-23 DIAGNOSIS — R8290 Unspecified abnormal findings in urine: Secondary | ICD-10-CM | POA: Diagnosis not present

## 2022-12-23 LAB — POCT URINALYSIS DIPSTICK
Bilirubin, UA: NEGATIVE
Blood, UA: NEGATIVE
Glucose, UA: NEGATIVE
Ketones, UA: NEGATIVE
Nitrite, UA: NEGATIVE
Protein, UA: NEGATIVE
Spec Grav, UA: 1.02 (ref 1.010–1.025)
Urobilinogen, UA: 0.2 E.U./dL
pH, UA: 5 (ref 5.0–8.0)

## 2022-12-24 ENCOUNTER — Encounter (HOSPITAL_BASED_OUTPATIENT_CLINIC_OR_DEPARTMENT_OTHER): Payer: Self-pay | Admitting: Obstetrics & Gynecology

## 2022-12-24 LAB — CERVICOVAGINAL ANCILLARY ONLY
Bacterial Vaginitis (gardnerella): NEGATIVE
Candida Glabrata: NEGATIVE
Candida Vaginitis: NEGATIVE
Comment: NEGATIVE
Comment: NEGATIVE
Comment: NEGATIVE

## 2022-12-24 NOTE — Progress Notes (Signed)
GYNECOLOGY  VISIT  CC:   vaginal discharge/irritation  HPI: 77 y.o. G85P2002 Single Black or African American female here for complaint of vaginal discharge and irritation.  She has also noted increased urinary frequency over the past few days.  She got up to void last night multiple times in the middle of the night.  Denies vaginal bleeding.  Also, feels some irritation externally.  Was initially on nurses scheduled but due to several complaints, was seen by provider today.     Past Medical History:  Diagnosis Date   Acid reflux    Allergic rhinitis    Arthritis    Asthma    Bruises easily    Cancer ([facility])    rt breast cancer   Chicken pox    Colon polyp    Family history of breast cancer    Family history of pancreatic cancer    Family history of prostate cancer    Gum disease    Hypertension    Hypothyroidism    Incontinence    Measles    Myopathy    arms and legs, statin drug related   Prediabetes 06/2014    MEDS:   Current Outpatient Medications on File Prior to Visit  Medication Sig Dispense Refill   betamethasone valerate ointment (VALISONE) 0.1 % Apply 1 Application topically 2 (two) times daily. Use for up to 2 weeks 30 g 0   cetirizine (ZYRTEC) 10 MG tablet Take 10 mg by mouth daily.     exemestane (AROMASIN) 25 MG tablet TAKE 1 TABLET BY MOUTH ONCE DAILY AFTER BREAKFAST 90 tablet 3   irbesartan (AVAPRO) 300 MG tablet Take 300 mg by mouth daily.      levothyroxine (SYNTHROID) 125 MCG tablet Take 125 mcg by mouth daily.     spironolactone (ALDACTONE) 50 MG tablet Take 50 mg by mouth daily. Taking 1/2 pill daily     terconazole (TERAZOL 7) 0.4 % vaginal cream Place 1 applicator vaginally at bedtime. Apply externally as directed as well. 45 g 0   Vitamin D, Ergocalciferol, (DRISDOL) 50000 units CAPS capsule Take 1 capsule by mouth once a week.  3   No current facility-administered medications on file prior to visit.    ALLERGIES: Statins, Adhesive [tape],  Epinephrine, Hydrogen peroxide, Lidocaine, Metrogel [metronidazole], Tamoxifen, Latex, Neomycin-bacitracin zn-polymyx, and Sulfa antibiotics  SH:  single, non smoker  Review of Systems  Constitutional: Negative.   Genitourinary:  Positive for frequency and urgency.    PHYSICAL EXAMINATION:    BP (!) 149/75 (BP Location: Left Arm, Patient Position: Sitting, Cuff Size: Large)   Pulse 97   Ht 5\' 2"  (1.575 m)   Wt 211 lb 3.2 oz (95.8 kg)   LMP 08/03/1984 (Approximate)   BMI 38.63 kg/m     General appearance: alert, cooperative and appears stated age Lymph:  no inguinal LAD noted  Pelvic: External genitalia:  no lesions but does have clitoromegaly               Urethra:  normal appearing urethra with no masses, tenderness or lesions              Bartholins and Skenes: normal                 Vagina: normal appearing vagina with normal color and discharge, no lesions               Chaperone unavailable and pt aware and desires to proceed with exam.  Assessment/Plan: 1. Clitorectomy - had normal testosterone last year.  Will check additional androgen levels today - 17-Hydroxyprogesterone - DHEA-sulfate - reviewed medications and do not see this as side effect  2. Primary hypertension - pt is on medication - encouraged to check BPs at home to monitor  3. Vaginal irritation - Cervicovaginal ancillary only( [place] [facility])  4. Urinary urgency - pt could not void today.  Given specimen container for her to try and collect at home.  Advised to bring back if able to collect. - POCT Urinalysis Dipstick - Urine Culture

## 2022-12-25 LAB — 17-HYDROXYPROGESTERONE: 17-OH Progesterone LCMS: 11 ng/dL

## 2022-12-25 LAB — URINE CULTURE

## 2022-12-25 LAB — DHEA-SULFATE: DHEA-SO4: 101 ug/dL (ref 20.4–186.6)

## 2023-01-04 ENCOUNTER — Encounter (HOSPITAL_BASED_OUTPATIENT_CLINIC_OR_DEPARTMENT_OTHER): Payer: Self-pay | Admitting: Obstetrics & Gynecology

## 2023-02-10 DIAGNOSIS — E559 Vitamin D deficiency, unspecified: Secondary | ICD-10-CM | POA: Diagnosis not present

## 2023-02-10 DIAGNOSIS — I10 Essential (primary) hypertension: Secondary | ICD-10-CM | POA: Diagnosis not present

## 2023-02-10 DIAGNOSIS — E039 Hypothyroidism, unspecified: Secondary | ICD-10-CM | POA: Diagnosis not present

## 2023-02-10 DIAGNOSIS — E1122 Type 2 diabetes mellitus with diabetic chronic kidney disease: Secondary | ICD-10-CM | POA: Diagnosis not present

## 2023-02-10 DIAGNOSIS — E7800 Pure hypercholesterolemia, unspecified: Secondary | ICD-10-CM | POA: Diagnosis not present

## 2023-02-17 DIAGNOSIS — E039 Hypothyroidism, unspecified: Secondary | ICD-10-CM | POA: Diagnosis not present

## 2023-02-17 DIAGNOSIS — E1122 Type 2 diabetes mellitus with diabetic chronic kidney disease: Secondary | ICD-10-CM | POA: Diagnosis not present

## 2023-02-17 DIAGNOSIS — E7800 Pure hypercholesterolemia, unspecified: Secondary | ICD-10-CM | POA: Diagnosis not present

## 2023-02-17 DIAGNOSIS — E559 Vitamin D deficiency, unspecified: Secondary | ICD-10-CM | POA: Diagnosis not present

## 2023-02-17 DIAGNOSIS — N1831 Chronic kidney disease, stage 3a: Secondary | ICD-10-CM | POA: Diagnosis not present

## 2023-02-17 DIAGNOSIS — G4700 Insomnia, unspecified: Secondary | ICD-10-CM | POA: Diagnosis not present

## 2023-02-17 DIAGNOSIS — Z0000 Encounter for general adult medical examination without abnormal findings: Secondary | ICD-10-CM | POA: Diagnosis not present

## 2023-02-17 DIAGNOSIS — I129 Hypertensive chronic kidney disease with stage 1 through stage 4 chronic kidney disease, or unspecified chronic kidney disease: Secondary | ICD-10-CM | POA: Diagnosis not present

## 2023-03-04 DIAGNOSIS — Z1231 Encounter for screening mammogram for malignant neoplasm of breast: Secondary | ICD-10-CM | POA: Diagnosis not present

## 2023-03-11 ENCOUNTER — Encounter (HOSPITAL_BASED_OUTPATIENT_CLINIC_OR_DEPARTMENT_OTHER): Payer: Self-pay | Admitting: *Deleted

## 2023-03-29 ENCOUNTER — Telehealth: Payer: Self-pay | Admitting: Hematology

## 2023-03-29 ENCOUNTER — Telehealth: Payer: Self-pay

## 2023-03-29 NOTE — Telephone Encounter (Signed)
Pt called the office and had some concerns about her antiestrogen Exemestane. Pt state that she is having  thinning of the which started a years ago. She also stated that has been having bad dreams since she started the medication in 2022. I mad the NP Heather aware  of her symptoms and I sent scheduling a message to get lab and f/u. Pt verbalized understanding.   Rondel Jumbo, CMA

## 2023-04-06 ENCOUNTER — Encounter: Payer: Self-pay | Admitting: Hematology

## 2023-04-06 ENCOUNTER — Inpatient Hospital Stay: Payer: Medicare Other | Admitting: Hematology

## 2023-04-06 ENCOUNTER — Inpatient Hospital Stay: Payer: Medicare Other | Attending: Nurse Practitioner

## 2023-04-06 VITALS — BP 154/74 | HR 79 | Temp 97.8°F | Resp 18 | Wt 215.1 lb

## 2023-04-06 DIAGNOSIS — M8580 Other specified disorders of bone density and structure, unspecified site: Secondary | ICD-10-CM

## 2023-04-06 DIAGNOSIS — C50411 Malignant neoplasm of upper-outer quadrant of right female breast: Secondary | ICD-10-CM

## 2023-04-06 DIAGNOSIS — Z170 Estrogen receptor positive status [ER+]: Secondary | ICD-10-CM | POA: Insufficient documentation

## 2023-04-06 DIAGNOSIS — Z79811 Long term (current) use of aromatase inhibitors: Secondary | ICD-10-CM | POA: Insufficient documentation

## 2023-04-06 LAB — CBC WITH DIFFERENTIAL (CANCER CENTER ONLY)
Abs Immature Granulocytes: 0.03 10*3/uL (ref 0.00–0.07)
Basophils Absolute: 0 10*3/uL (ref 0.0–0.1)
Basophils Relative: 1 %
Eosinophils Absolute: 0.4 10*3/uL (ref 0.0–0.5)
Eosinophils Relative: 5 %
HCT: 39 % (ref 36.0–46.0)
Hemoglobin: 12.9 g/dL (ref 12.0–15.0)
Immature Granulocytes: 0 %
Lymphocytes Relative: 27 %
Lymphs Abs: 2 10*3/uL (ref 0.7–4.0)
MCH: 30.6 pg (ref 26.0–34.0)
MCHC: 33.1 g/dL (ref 30.0–36.0)
MCV: 92.4 fL (ref 80.0–100.0)
Monocytes Absolute: 0.5 10*3/uL (ref 0.1–1.0)
Monocytes Relative: 7 %
Neutro Abs: 4.5 10*3/uL (ref 1.7–7.7)
Neutrophils Relative %: 60 %
Platelet Count: 204 10*3/uL (ref 150–400)
RBC: 4.22 MIL/uL (ref 3.87–5.11)
RDW: 13.3 % (ref 11.5–15.5)
WBC Count: 7.3 10*3/uL (ref 4.0–10.5)
nRBC: 0 % (ref 0.0–0.2)

## 2023-04-06 LAB — CMP (CANCER CENTER ONLY)
ALT: 12 U/L (ref 0–44)
AST: 14 U/L — ABNORMAL LOW (ref 15–41)
Albumin: 4 g/dL (ref 3.5–5.0)
Alkaline Phosphatase: 72 U/L (ref 38–126)
Anion gap: 5 (ref 5–15)
BUN: 14 mg/dL (ref 8–23)
CO2: 27 mmol/L (ref 22–32)
Calcium: 9.5 mg/dL (ref 8.9–10.3)
Chloride: 105 mmol/L (ref 98–111)
Creatinine: 1.01 mg/dL — ABNORMAL HIGH (ref 0.44–1.00)
GFR, Estimated: 58 mL/min — ABNORMAL LOW (ref >60)
Glucose, Bld: 134 mg/dL — ABNORMAL HIGH (ref 70–99)
Potassium: 4.2 mmol/L (ref 3.5–5.1)
Sodium: 137 mmol/L (ref 135–145)
Total Bilirubin: 1.1 mg/dL (ref 0.3–1.2)
Total Protein: 7.9 g/dL (ref 6.5–8.1)

## 2023-04-06 NOTE — Progress Notes (Signed)
[facility]   [phone] Fax:([phone]   Clinic Follow up Note   Patient Care Team: Jerene Bears, MD as PCP - General (Obstetrics and Gynecology) Griselda Miner, MD as Consulting Physician (General Surgery) Malachy Mood, MD as Consulting Physician (Hematology) Antony Blackbird, MD as Consulting Physician (Radiation Oncology) Pollyann Samples, NP as Nurse Practitioner (Nurse Practitioner)  Date of Service:  04/06/2023  CHIEF COMPLAINT: f/u of right breast cancer   CURRENT THERAPY:  Antiestrogen therapy, started 07/2018. On exemestane since 09/2020   ASSESSMENT:  Lisa Horton is a 77 y.o. female with   Malignant neoplasm of upper-outer quadrant of right breast in female, estrogen receptor positive (HCC)  invasive lobular carcinoma, Stage IA, pT2N0M0, G2, ER (+), PR (+), HER2 negative -Diagnosed in 01/2018. S/p right breast lumpectomy and adjuvant radiation.  -Her Oncotype score showed low risk with recurrence score 17, adjuvant chemotherapy was not recommended  -She started anti-estrogen therapy in 07/2018. She has tried Anastrozole, Tamoxifen, then Letrozole but switched due to AE's (arthrlagia, vaginal itching, insomnia, nightmares).  Tolerating exemestane much better -She is concerned about her hair loss from exemestane.  She will completed 5 years therapy in December 2024.  Due to the side effects, I did not recommend extended antiestrogen therapy. -She is clinically doing well, exam was unremarkable, there is no clinical concern for recurrence. -Follow-up in 6 months, then annually.  Osteopenia -Her 01/2017 and 02/2019 DEXA's showed Osteopenia  -She declined zometa or prolia in the past  -continue vitamin D and weight bearing exercise, she gets constipated from calcium    PLAN: -Lab reviewed -CMP- reviewed -Pt will continue Tamoxifen til the end of the year. -lab and f/u in 6 months with NP Lacie    SUMMARY OF ONCOLOGIC HISTORY: Oncology  History Overview Note  Cancer Staging Malignant neoplasm of upper-outer quadrant of right breast in female, estrogen receptor positive (HCC) Staging form: Breast, AJCC 8th Edition - Clinical stage from 01/04/2018: Stage IA (cT1c, cN0, cM0, G2, ER+, PR+, HER2-) - Signed by Malachy Mood, MD on 01/11/2018 - Pathologic: Stage IA (pT2, pN0, cM0, G2, ER+, PR+, HER2-) - Signed by Serena Croissant, MD on 06/21/2018     Malignant neoplasm of upper-outer quadrant of right breast in female, estrogen receptor positive (HCC)  12/28/2017 Mammogram   Bilateral diagnostic mammography with tomography and right breast ultrasonography at [facility] on 12/28/2017 showing: The irregular architectural distortion in the right breast upper outer quadrant posterior depth is indeterminate. The architectural distortion in the right breast upper outer quadrant middle depth is indeterminate.    01/03/2018 Pathology Results   Right needle core biopsy with pathology showing: Breast, right, needle core biopsy, (A) 11 o'clock with invasive mammary carcinoma, grade I-II. Breast, right, needle core biopsy, (B) 11 o'clock with invasive mammary carcinoma, grade I-II. Prognostic indicators significant for: ER, 60% positive with moderate staining intensity and PR, 90% positive with strong staining intensity. Proliferation marker Ki67 at 1%. HER2 negative.   01/04/2018 Cancer Staging   Staging form: Breast, AJCC 8th Edition - Clinical stage from 01/04/2018: Stage IA (cT1c, cN0, cM0, G2, ER+, PR+, HER2-) - Signed by Malachy Mood, MD on 01/11/2018   01/25/2018 Genetic Testing   The Multi-Cancer Panel offered by [facility] includes sequencing and/or deletion duplication testing of the following 83 genes: ALK, APC, ATM, AXIN2,BAP1,  BARD1, BLM, BMPR1A, BRCA1, BRCA2, BRIP1, CASR, CDC73, CDH1, CDK4, CDKN1B, CDKN1C, CDKN2A (p14ARF), CDKN2A (p16INK4a), CEBPA, CHEK2, CTNNA1, DICER1, DIS3L2, EGFR (c.2369C>T, p.Thr790Met  variant only), EPCAM (Deletion/duplication testing  only), FH, FLCN, GATA2, GPC3, GREM1 (Promoter region deletion/duplication testing only), HOXB13 (c.251G>A, p.Gly84Glu), HRAS, KIT, MAX, MEN1, MET, MITF (c.952G>A, p.Glu318Lys variant only), MLH1, MSH2, MSH3, MSH6, MUTYH, NBN, NF1, NF2, NTHL1, PALB2, PDGFRA, PHOX2B, PMS2, POLD1, POLE, POT1, PRKAR1A, PTCH1, PTEN, RAD50, RAD51C, RAD51D, RB1, RECQL4, RET, RUNX1, SDHAF2, SDHA (sequence changes only), SDHB, SDHC, SDHD, SMAD4, SMARCA4, SMARCB1, SMARCE1, STK11, SUFU, TERC, TERT, TMEM127, TP53, TSC1, TSC2, VHL, WRN and WT1.   Results: No pathogenic variants identified.  A Variant of Uncertain significance in BAP1 was identified c.1066C>T (p.Arg356Trp).  The date of this test report is 01/25/2018.    03/09/2018 Surgery   Right lumpectomy: ILC grade 2, 2 foci, 2 cm, 2.5 cm, superior margin positive, 0/5 lymph nodes negative, ER 60%, PR 90%, Ki-67 1%, HER-2 negative T2N0 stage Ia   03/09/2018 Oncotype testing   Recurrence score 17 Distant risk of recurrence at 9 years with AI or Tamoxifen alone is 5% There is a <1% benefit of chemotherapy   03/31/2018 Pathology Results   Reexcision: No residual cancer   05/09/2018 - 07/08/2018 Radiation Therapy   Radiation with Dr. Roselind Messier 05/09/18- 07/08/18    06/21/2018 Cancer Staging   Staging form: Breast, AJCC 8th Edition - Pathologic: Stage IA (pT2, pN0, cM0, G2, ER+, PR+, HER2-) - Signed by Serena Croissant, MD on 06/21/2018   07/2018 -  Anti-estrogen oral therapy   Anastrozole 1mg  daily starting in 07/2018. Stopped in early 03/2019 due to joint pain. Switched to Tamoxifen in 08/2019. Switched to Letrozole in 04/2020 due to joint pain and vignal itching -She was not able to try Exemestane due to copay    Survivorship   Per Santiago Glad, NP       INTERVAL HISTORY:  Lisa Horton is here for a follow up of  right breast cancer.She was last seen by  NP Lacie on 03/12/2022. She presents to the clinic alone. Pt state that she is experiencing hair thinning for about 3 months.  Pt reports are not being able to sleep since she has been on Tamoxifen.      All other systems were reviewed with the patient and are negative.  MEDICAL HISTORY:  Past Medical History:  Diagnosis Date   Acid reflux    Allergic rhinitis    Arthritis    Asthma    Bruises easily    Cancer (HCC)    rt breast cancer   Chicken pox    Colon polyp    Family history of breast cancer    Family history of pancreatic cancer    Family history of prostate cancer    Gum disease    Hypertension    Hypothyroidism    Incontinence    Measles    Myopathy    arms and legs, statin drug related   Prediabetes 06/2014    SURGICAL HISTORY: Past Surgical History:  Procedure Laterality Date   ABDOMINAL HYSTERECTOMY  1986   secondary to fibroids   BREAST LUMPECTOMY WITH RADIOACTIVE SEED AND SENTINEL LYMPH NODE BIOPSY Right 03/09/2018   Procedure: RIGHT BREAST LUMPECTOMY WITH BRACKETED RADIOACTIVE SEED AND SENTINEL LYMPH NODE BIOPSY;  Surgeon: Emelia Loron, MD;  Location: [facility];  Service: General;  Laterality: Right;   BREAST SURGERY  07/09/2008   mass removal   CHOLECYSTECTOMY  03/17/11   RE-EXCISION OF BREAST LUMPECTOMY Right 03/31/2018   Procedure: RE-EXCISION OF BREAST LUMPECTOMY;  Surgeon: Emelia Loron, MD;  Location: [facility];  Service: General;  Laterality: Right;   SKIN TAG REMOVAL     brow and lid   THIGH / KNEE SOFT TISSUE BIOPSY  09/05/2009   TUBAL LIGATION  1980    I have reviewed the social history and family history with the patient and they are unchanged from previous note.  ALLERGIES:  is allergic to statins, adhesive [tape], epinephrine, hydrogen peroxide, lidocaine, metrogel [metronidazole], tamoxifen, latex, neomycin-bacitracin zn-polymyx, and sulfa antibiotics.  MEDICATIONS:  Current Outpatient Medications  Medication Sig Dispense Refill   betamethasone valerate ointment (VALISONE) 0.1 % Apply 1 Application topically 2 (two)  times daily. Use for up to 2 weeks 30 g 0   cetirizine (ZYRTEC) 10 MG tablet Take 10 mg by mouth daily.     exemestane (AROMASIN) 25 MG tablet TAKE 1 TABLET BY MOUTH ONCE DAILY AFTER BREAKFAST 90 tablet 3   irbesartan (AVAPRO) 300 MG tablet Take 300 mg by mouth daily.      levothyroxine (SYNTHROID) 125 MCG tablet Take 125 mcg by mouth daily.     spironolactone (ALDACTONE) 50 MG tablet Take 50 mg by mouth daily. Taking 1/2 pill daily     terconazole (TERAZOL 7) 0.4 % vaginal cream Place 1 applicator vaginally at bedtime. Apply externally as directed as well. 45 g 0   Vitamin D, Ergocalciferol, (DRISDOL) 50000 units CAPS capsule Take 1 capsule by mouth once a week.  3   No current facility-administered medications for this visit.    PHYSICAL EXAMINATION: ECOG PERFORMANCE STATUS: 1 - Symptomatic but completely ambulatory  Vitals:   04/06/23 1455  BP: (!) 154/74  Pulse: 79  Resp: 18  Temp: 97.8 F (36.6 C)  SpO2: 98%   Wt Readings from Last 3 Encounters:  04/06/23 215 lb 1.6 oz (97.6 kg)  12/23/22 211 lb 3.2 oz (95.8 kg)  04/26/22 210 lb (95.3 kg)     GENERAL:alert, no distress and comfortable SKIN: skin color normal, no rashes or significant lesions EYES: normal, Conjunctiva are pink and non-injected, sclera clear  NEURO: alert & oriented x 3 with fluent speech NECK: (-)supple, thyroid normal size, non-tender, without nodularity LYMPH:(-)  no palpable lymphadenopathy in the cervical, axillary ABDOMEN:(-)abdomen soft, (-) non-tender and normal bowel sounds BREAST: rt breast tender , scar tissue near the incision, firm from previous radiation. Lt breast normal in color. No palpable mass. Breast exam benign. LABORATORY DATA:  I have reviewed the data as listed    Latest Ref Rng & Units 04/06/2023    2:22 PM 03/12/2022   10:19 AM 09/15/2021    1:52 PM  CBC  WBC 4.0 - 10.5 K/uL 7.3  7.8  6.8   Hemoglobin 12.0 - 15.0 g/dL 36.6  44.0  34.7   Hematocrit 36.0 - 46.0 % 39.0  38.5  40.7    Platelets 150 - 400 K/uL 204  186  176         Latest Ref Rng & Units 04/06/2023    2:22 PM 03/12/2022   10:19 AM 09/15/2021    1:52 PM  CMP  Glucose 70 - 99 mg/dL 425  956  387   BUN 8 - 23 mg/dL 14  13  16    Creatinine 0.44 - 1.00 mg/dL 5.64  3.32  9.51   Sodium 135 - 145 mmol/L 137  138  139   Potassium 3.5 - 5.1 mmol/L 4.2  4.5  4.1   Chloride 98 - 111 mmol/L 105  104  103   CO2 22 -  32 mmol/L 27  28  29    Calcium 8.9 - 10.3 mg/dL 9.5  9.7  9.9   Total Protein 6.5 - 8.1 g/dL 7.9  8.5  8.0   Total Bilirubin 0.3 - 1.2 mg/dL 1.1  0.7  0.9   Alkaline Phos 38 - 126 U/L 72  66  69   AST 15 - 41 U/L 14  16  17    ALT 0 - 44 U/L 12  16  15        RADIOGRAPHIC STUDIES: I have personally reviewed the radiological images as listed and agreed with the findings in the report. No results found.    No orders of the defined types were placed in this encounter.  All questions were answered. The patient knows to call the clinic with any problems, questions or concerns. No barriers to learning was detected. The total time spent in the appointment was 25 minutes.     Malachy Mood, MD 04/06/2023   Carolin Coy, CMA, am acting as scribe for Malachy Mood, MD.   I have reviewed the above documentation for accuracy and completeness, and I agree with the above.

## 2023-04-06 NOTE — Assessment & Plan Note (Signed)
-  Her 01/2017 and 02/2019 DEXA's showed Osteopenia  -She declined zometa or prolia in the past  -continue vitamin D and weight bearing exercise, she gets constipated from calcium

## 2023-04-06 NOTE — Assessment & Plan Note (Addendum)
invasive lobular carcinoma, Stage IA, pT2N0M0, G2, ER (+), PR (+), HER2 negative -Diagnosed in 01/2018. S/p right breast lumpectomy and adjuvant radiation.  -Her Oncotype score showed low risk with recurrence score 17, adjuvant chemotherapy was not recommended  -She started anti-estrogen therapy in 07/2018. She has tried Anastrozole, Tamoxifen, then Letrozole but switched due to AE's (arthrlagia, vaginal itching, insomnia, nightmares).  Tolerating exemestane much better -She is concerned about her hair loss from exemestane.  She will completed 5 years therapy in December 2024.  Due to the side effects, I did not recommend extended antiestrogen therapy. -She is clinically doing well, exam was unremarkable, there is no clinical concern for recurrence. -Follow-up in 6 months, then annually.

## 2023-04-16 DIAGNOSIS — C50411 Malignant neoplasm of upper-outer quadrant of right female breast: Secondary | ICD-10-CM | POA: Diagnosis not present

## 2023-04-16 DIAGNOSIS — Z170 Estrogen receptor positive status [ER+]: Secondary | ICD-10-CM | POA: Diagnosis not present

## 2023-06-16 DIAGNOSIS — E7800 Pure hypercholesterolemia, unspecified: Secondary | ICD-10-CM | POA: Diagnosis not present

## 2023-06-16 DIAGNOSIS — I129 Hypertensive chronic kidney disease with stage 1 through stage 4 chronic kidney disease, or unspecified chronic kidney disease: Secondary | ICD-10-CM | POA: Diagnosis not present

## 2023-06-16 DIAGNOSIS — N1831 Chronic kidney disease, stage 3a: Secondary | ICD-10-CM | POA: Diagnosis not present

## 2023-06-16 DIAGNOSIS — E039 Hypothyroidism, unspecified: Secondary | ICD-10-CM | POA: Diagnosis not present

## 2023-06-16 DIAGNOSIS — E1122 Type 2 diabetes mellitus with diabetic chronic kidney disease: Secondary | ICD-10-CM | POA: Diagnosis not present

## 2023-06-23 DIAGNOSIS — E039 Hypothyroidism, unspecified: Secondary | ICD-10-CM | POA: Diagnosis not present

## 2023-06-23 DIAGNOSIS — I519 Heart disease, unspecified: Secondary | ICD-10-CM | POA: Diagnosis not present

## 2023-06-23 DIAGNOSIS — E1122 Type 2 diabetes mellitus with diabetic chronic kidney disease: Secondary | ICD-10-CM | POA: Diagnosis not present

## 2023-06-23 DIAGNOSIS — J309 Allergic rhinitis, unspecified: Secondary | ICD-10-CM | POA: Diagnosis not present

## 2023-06-23 DIAGNOSIS — H9193 Unspecified hearing loss, bilateral: Secondary | ICD-10-CM | POA: Diagnosis not present

## 2023-06-23 DIAGNOSIS — E7800 Pure hypercholesterolemia, unspecified: Secondary | ICD-10-CM | POA: Diagnosis not present

## 2023-06-23 DIAGNOSIS — Z23 Encounter for immunization: Secondary | ICD-10-CM | POA: Diagnosis not present

## 2023-06-23 DIAGNOSIS — I129 Hypertensive chronic kidney disease with stage 1 through stage 4 chronic kidney disease, or unspecified chronic kidney disease: Secondary | ICD-10-CM | POA: Diagnosis not present

## 2023-06-23 DIAGNOSIS — N1831 Chronic kidney disease, stage 3a: Secondary | ICD-10-CM | POA: Diagnosis not present

## 2023-06-27 ENCOUNTER — Other Ambulatory Visit: Payer: Self-pay | Admitting: Hematology

## 2023-06-27 DIAGNOSIS — Z170 Estrogen receptor positive status [ER+]: Secondary | ICD-10-CM

## 2023-07-15 DIAGNOSIS — H903 Sensorineural hearing loss, bilateral: Secondary | ICD-10-CM | POA: Diagnosis not present

## 2023-07-29 ENCOUNTER — Other Ambulatory Visit: Payer: Self-pay | Admitting: Nurse Practitioner

## 2023-07-29 DIAGNOSIS — C50411 Malignant neoplasm of upper-outer quadrant of right female breast: Secondary | ICD-10-CM

## 2023-09-21 DIAGNOSIS — E039 Hypothyroidism, unspecified: Secondary | ICD-10-CM | POA: Diagnosis not present

## 2023-09-21 DIAGNOSIS — N1831 Chronic kidney disease, stage 3a: Secondary | ICD-10-CM | POA: Diagnosis not present

## 2023-09-21 DIAGNOSIS — I129 Hypertensive chronic kidney disease with stage 1 through stage 4 chronic kidney disease, or unspecified chronic kidney disease: Secondary | ICD-10-CM | POA: Diagnosis not present

## 2023-09-21 DIAGNOSIS — E1122 Type 2 diabetes mellitus with diabetic chronic kidney disease: Secondary | ICD-10-CM | POA: Diagnosis not present

## 2023-09-21 DIAGNOSIS — E7800 Pure hypercholesterolemia, unspecified: Secondary | ICD-10-CM | POA: Diagnosis not present

## 2023-09-28 DIAGNOSIS — R413 Other amnesia: Secondary | ICD-10-CM | POA: Diagnosis not present

## 2023-09-28 DIAGNOSIS — I519 Heart disease, unspecified: Secondary | ICD-10-CM | POA: Diagnosis not present

## 2023-09-28 DIAGNOSIS — J309 Allergic rhinitis, unspecified: Secondary | ICD-10-CM | POA: Diagnosis not present

## 2023-09-28 DIAGNOSIS — I129 Hypertensive chronic kidney disease with stage 1 through stage 4 chronic kidney disease, or unspecified chronic kidney disease: Secondary | ICD-10-CM | POA: Diagnosis not present

## 2023-09-28 DIAGNOSIS — E1122 Type 2 diabetes mellitus with diabetic chronic kidney disease: Secondary | ICD-10-CM | POA: Diagnosis not present

## 2023-09-28 DIAGNOSIS — E7800 Pure hypercholesterolemia, unspecified: Secondary | ICD-10-CM | POA: Diagnosis not present

## 2023-09-28 DIAGNOSIS — N1831 Chronic kidney disease, stage 3a: Secondary | ICD-10-CM | POA: Diagnosis not present

## 2023-10-03 ENCOUNTER — Other Ambulatory Visit: Payer: Self-pay | Admitting: Nurse Practitioner

## 2023-10-03 DIAGNOSIS — Z170 Estrogen receptor positive status [ER+]: Secondary | ICD-10-CM

## 2023-10-03 NOTE — Progress Notes (Unsigned)
 Patient Care Team: Jerene Bears, MD as PCP - General (Obstetrics and Gynecology) Griselda Miner, MD as Consulting Physician (General Surgery) Malachy Mood, MD as Consulting Physician (Hematology) Antony Blackbird, MD as Consulting Physician (Radiation Oncology) Pollyann Samples, NP as Nurse Practitioner (Nurse Practitioner)  Clinic Day:  10/04/2023  Referring physician: Jerene Bears, MD  ASSESSMENT & PLAN:   Assessment & Plan: Malignant neoplasm of upper-outer quadrant of right breast in female, estrogen receptor positive (HCC)  invasive lobular carcinoma, Stage IA, pT2N0M0, G2, ER (+), PR (+), HER2 negative -Diagnosed in 01/2018. S/p right breast lumpectomy and adjuvant radiation.  -Her Oncotype score showed low risk with recurrence score 17, adjuvant chemotherapy was not recommended  -She started anti-estrogen therapy in 07/2018. She has tried Anastrozole, Tamoxifen, then Letrozole but switched due to AE's (arthrlagia, vaginal itching, insomnia, nightmares).  Tolerating exemestane much better -She is concerned about her hair loss from exemestane.  She completed 5 years therapy in December 2024.  Due to the side effects, I did not recommend extended antiestrogen therapy. -She is clinically doing well, exam was unremarkable, there is no clinical concern for recurrence. -Follow-up in 6 months with labs,  then annually. -new mammogram should be scheduled for 03/2024. Will inquire about dexa scan from GYN provider.   Axillary soreness Bilateral.  Unclear etiology. No axillary lymphadenopathy bilaterally during today's exam. Mammography done 03/04/2023 was benign. Will continue to monitor with yearly mammography.  Plan: Labs reviewed.  Slight abnormality of renal functions but labs are otherwise stable. Reviewed mammography from 03/04/2023 which was benign. Completed 5 years treatment with antiestrogen, exemestane, in December 2024. Will get 3D diagnostic mammogram for August 2025.  Patient has  expressed interest in getting CAD screening.  States she is willing to pay out of pocket not covered by insurance.  She does go to [facility] mammography every year. Most recent DEXA scan from 2020.  Will contact GYN office to see if there is more recent imaging.  Will order new DEXA scan as indicated. Labs with follow-up in 6 months.  She can follow-up sooner if needed.  Will go to yearly follow-ups after next visit. Patient seen by Vincent Gros, NP  The patient understands the plans discussed today and is in agreement with them.  She knows to contact our office if she develops concerns prior to her next appointment.  I provided 25 minutes of face-to-face time during this encounter and > 50% was spent counseling as documented under my assessment and plan.    Carlean Jews, NP  [facility] - A DEPT OF [facility] [address] [place] [address] Dept: [phone] Dept Fax: [phone]   Orders Placed This Encounter  Procedures   MM 3D DIAGNOSTIC MAMMOGRAM BILATERAL BREAST    Standing Status:   Future    Expected Date:   03/05/2024    Expiration Date:   10/03/2024    Reason for Exam (SYMPTOM  OR DIAGNOSIS REQUIRED):   breast cancer follow up    Preferred imaging location?:   External             [facility] preferred      CHIEF COMPLAINT:  CC: right breast cancer, estrogen receptor positive   Current Treatment:  surveillance  INTERVAL HISTORY:  Lisa Horton is here today for repeat clinical assessment. She was last seen by Dr. Mosetta Putt 04/06/2023. Competed treatment with exemestane at the end of 2024  she states that she does have some soreness to bilateral underarm areas. This is not necessarily new, but has never really gone away since she had lumpectomy. She denies recent illness. Underarms do not have precise area of pain, no warmth, and no abscess like formations. Most recent mammogram done 03/2023 with benign results.  She denies fevers or chills. She denies chest pain, chest pressure, or shortness of breath. She denies headaches or visual disturbances. She denies abdominal pain, nausea, vomiting, or changes in bowel or bladder habits.  Her appetite is good. Her weight has decreased 4 pounds over last 6 months .  I have reviewed the past medical history, past surgical history, social history and family history with the patient and they are unchanged from previous note.  ALLERGIES:  is allergic to statins, adhesive [tape], epinephrine, hydrogen peroxide, lidocaine, metrogel [metronidazole], tamoxifen, latex, neomycin-bacitracin zn-polymyx, and sulfa antibiotics.  MEDICATIONS:  Current Outpatient Medications  Medication Sig Dispense Refill   betamethasone valerate ointment (VALISONE) 0.1 % Apply 1 Application topically 2 (two) times daily. Use for up to 2 weeks 30 g 0   cetirizine (ZYRTEC) 10 MG tablet Take 10 mg by mouth daily.     exemestane (AROMASIN) 25 MG tablet TAKE 1 TABLET BY MOUTH ONCE DAILY AFTER BREAKFAST 30 tablet 0   irbesartan (AVAPRO) 300 MG tablet Take 300 mg by mouth daily.      levothyroxine (SYNTHROID) 125 MCG tablet Take 125 mcg by mouth daily.     spironolactone (ALDACTONE) 50 MG tablet Take 50 mg by mouth daily. Taking 1/2 pill daily     terconazole (TERAZOL 7) 0.4 % vaginal cream Place 1 applicator vaginally at bedtime. Apply externally as directed as well. 45 g 0   Vitamin D, Ergocalciferol, (DRISDOL) 50000 units CAPS capsule Take 1 capsule by mouth once a week.  3   No current facility-administered medications for this visit.    HISTORY OF PRESENT ILLNESS:   Oncology History Overview Note  Cancer Staging Malignant neoplasm of upper-outer quadrant of right breast in female, estrogen receptor positive (HCC) Staging form: Breast, AJCC 8th Edition - Clinical stage from 01/04/2018: Stage IA (cT1c, cN0, cM0, G2, ER+, PR+, HER2-) - Signed by Malachy Mood, MD on 01/11/2018 - Pathologic: Stage  IA (pT2, pN0, cM0, G2, ER+, PR+, HER2-) - Signed by Serena Croissant, MD on 06/21/2018     Malignant neoplasm of upper-outer quadrant of right breast in female, estrogen receptor positive (HCC)  12/28/2017 Mammogram   Bilateral diagnostic mammography with tomography and right breast ultrasonography at [facility] on 12/28/2017 showing: The irregular architectural distortion in the right breast upper outer quadrant posterior depth is indeterminate. The architectural distortion in the right breast upper outer quadrant middle depth is indeterminate.    01/03/2018 Pathology Results   Right needle core biopsy with pathology showing: Breast, right, needle core biopsy, (A) 11 o'clock with invasive mammary carcinoma, grade I-II. Breast, right, needle core biopsy, (B) 11 o'clock with invasive mammary carcinoma, grade I-II. Prognostic indicators significant for: ER, 60% positive with moderate staining intensity and PR, 90% positive with strong staining intensity. Proliferation marker Ki67 at 1%. HER2 negative.   01/04/2018 Cancer Staging   Staging form: Breast, AJCC 8th Edition - Clinical stage from 01/04/2018: Stage IA (cT1c, cN0, cM0, G2, ER+, PR+, HER2-) - Signed by Malachy Mood, MD on 01/11/2018   01/25/2018 Genetic Testing   The Multi-Cancer Panel offered by [facility] includes sequencing and/or deletion duplication testing of the following 83 genes: ALK, APC,  ATM, AXIN2,BAP1,  BARD1, BLM, BMPR1A, BRCA1, BRCA2, BRIP1, CASR, CDC73, CDH1, CDK4, CDKN1B, CDKN1C, CDKN2A (p14ARF), CDKN2A (p16INK4a), CEBPA, CHEK2, CTNNA1, DICER1, DIS3L2, EGFR (c.2369C>T, p.Thr790Met variant only), EPCAM (Deletion/duplication testing only), FH, FLCN, GATA2, GPC3, GREM1 (Promoter region deletion/duplication testing only), HOXB13 (c.251G>A, p.Gly84Glu), HRAS, KIT, MAX, MEN1, MET, MITF (c.952G>A, p.Glu318Lys variant only), MLH1, MSH2, MSH3, MSH6, MUTYH, NBN, NF1, NF2, NTHL1, PALB2, PDGFRA, PHOX2B, PMS2, POLD1, POLE, POT1, PRKAR1A, PTCH1, PTEN, RAD50,  RAD51C, RAD51D, RB1, RECQL4, RET, RUNX1, SDHAF2, SDHA (sequence changes only), SDHB, SDHC, SDHD, SMAD4, SMARCA4, SMARCB1, SMARCE1, STK11, SUFU, TERC, TERT, TMEM127, TP53, TSC1, TSC2, VHL, WRN and WT1.   Results: No pathogenic variants identified.  A Variant of Uncertain significance in BAP1 was identified c.1066C>T (p.Arg356Trp).  The date of this test report is 01/25/2018.    03/09/2018 Surgery   Right lumpectomy: ILC grade 2, 2 foci, 2 cm, 2.5 cm, superior margin positive, 0/5 lymph nodes negative, ER 60%, PR 90%, Ki-67 1%, HER-2 negative T2N0 stage Ia   03/09/2018 Oncotype testing   Recurrence score 17 Distant risk of recurrence at 9 years with AI or Tamoxifen alone is 5% There is a <1% benefit of chemotherapy   03/31/2018 Pathology Results   Reexcision: No residual cancer   05/09/2018 - 07/08/2018 Radiation Therapy   Radiation with Dr. Roselind Messier 05/09/18- 07/08/18    06/21/2018 Cancer Staging   Staging form: Breast, AJCC 8th Edition - Pathologic: Stage IA (pT2, pN0, cM0, G2, ER+, PR+, HER2-) - Signed by Serena Croissant, MD on 06/21/2018   07/2018 -  Anti-estrogen oral therapy   Anastrozole 1mg  daily starting in 07/2018. Stopped in early 03/2019 due to joint pain. Switched to Tamoxifen in 08/2019. Switched to Letrozole in 04/2020 due to joint pain and vignal itching -She was not able to try Exemestane due to copay    Survivorship   Per Santiago Glad, NP        REVIEW OF SYSTEMS:   Constitutional: Denies fevers, chills or abnormal weight loss Eyes: Denies blurriness of vision Ears, nose, mouth, throat, and face: Denies mucositis or sore throat Respiratory: Denies cough, dyspnea or wheezes Cardiovascular: Denies palpitation, chest discomfort or lower extremity swelling Gastrointestinal:  Denies nausea, heartburn or change in bowel habits Skin: Denies abnormal skin rashes Lymphatics: Denies new lymphadenopathy or easy bruising Neurological:Denies numbness, tingling or new  weaknesses Behavioral/Psych: Mood is stable, no new changes  BreastL tenderness of both axillary areas, especially under the right arm. No changes or new lumps have been noted.  All other systems were reviewed with the patient and are negative.   VITALS:   Today's Vitals   10/04/23 1153 10/04/23 1200  BP: 138/69   Pulse: 76   Resp: 16   Temp: 97.6 F (36.4 C)   TempSrc: Temporal   SpO2: 100%   Weight: 211 lb 1.6 oz (95.8 kg)   Height: 5\' 2"  (1.575 m)   PainSc:  0-No pain   Body mass index is 38.61 kg/m.   Wt Readings from Last 3 Encounters:  10/04/23 211 lb 1.6 oz (95.8 kg)  04/06/23 215 lb 1.6 oz (97.6 kg)  12/23/22 211 lb 3.2 oz (95.8 kg)    Body mass index is 38.61 kg/m.  Performance status (ECOG): 1 - Symptomatic but completely ambulatory  PHYSICAL EXAM:   GENERAL:alert, no distress and comfortable SKIN: skin color, texture, turgor are normal, no rashes or significant lesions EYES: normal, Conjunctiva are pink and non-injected, sclera clear OROPHARYNX:no exudate, no erythema and lips, buccal mucosa,  and tongue normal  NECK: supple, thyroid normal size, non-tender, without nodularity LYMPH:  no palpable lymphadenopathy in the cervical, axillary or inguinal LUNGS: clear to auscultation and percussion with normal breathing effort HEART: regular rate & rhythm and no murmurs and no lower extremity edema ABDOMEN:abdomen soft, non-tender and normal bowel sounds Musculoskeletal:no cyanosis of digits and no clubbing  NEURO: alert & oriented x 3 with fluent speech, no focal motor/sensory deficits BREAST: right breast has two well-healed lumpectomy scars along the outer perimeter. There is a third excisional scar in right axillary region. Darkening and thickening of the skin of right breast is noted. Slight nipple inversion which is baseline since radiation treatment. No palpable masses or lumps in the right breast. Well healed excisional scar in right axillary region. There  are no palpable masses or lumps I can appreciate today. No right or left axillary lymphadenopathy noted during today's exam. The left breast is without palpable masses or lumps. There is no nipple inversion or nipple discharge.   LABORATORY DATA:  I have reviewed the data as listed    Component Value Date/Time   NA 139 10/04/2023 1125   K 4.5 10/04/2023 1125   CL 107 10/04/2023 1125   CO2 28 10/04/2023 1125   GLUCOSE 137 (H) 10/04/2023 1125   BUN 12 10/04/2023 1125   CREATININE 1.06 (H) 10/04/2023 1125   CALCIUM 9.4 10/04/2023 1125   PROT 8.0 10/04/2023 1125   ALBUMIN 4.0 10/04/2023 1125   AST 14 (L) 10/04/2023 1125   ALT 12 10/04/2023 1125   ALKPHOS 76 10/04/2023 1125   BILITOT 0.9 10/04/2023 1125   GFRNONAA 55 (L) 10/04/2023 1125   GFRAA 54 (L) 08/14/2019 1315    Lab Results  Component Value Date   WBC 7.7 10/04/2023   NEUTROABS 5.0 10/04/2023   HGB 13.2 10/04/2023   HCT 42.0 10/04/2023   MCV 94.4 10/04/2023   PLT 212 10/04/2023

## 2023-10-04 ENCOUNTER — Telehealth: Payer: Self-pay

## 2023-10-04 ENCOUNTER — Encounter: Payer: Self-pay | Admitting: Nurse Practitioner

## 2023-10-04 ENCOUNTER — Inpatient Hospital Stay: Payer: Medicare Other | Admitting: Nurse Practitioner

## 2023-10-04 ENCOUNTER — Inpatient Hospital Stay: Payer: Medicare Other | Attending: Hematology

## 2023-10-04 VITALS — BP 138/69 | HR 76 | Temp 97.6°F | Resp 16 | Ht 62.0 in | Wt 211.1 lb

## 2023-10-04 DIAGNOSIS — Z1732 Human epidermal growth factor receptor 2 negative status: Secondary | ICD-10-CM | POA: Diagnosis not present

## 2023-10-04 DIAGNOSIS — Z170 Estrogen receptor positive status [ER+]: Secondary | ICD-10-CM | POA: Diagnosis not present

## 2023-10-04 DIAGNOSIS — C50411 Malignant neoplasm of upper-outer quadrant of right female breast: Secondary | ICD-10-CM | POA: Diagnosis not present

## 2023-10-04 DIAGNOSIS — Z79899 Other long term (current) drug therapy: Secondary | ICD-10-CM | POA: Insufficient documentation

## 2023-10-04 DIAGNOSIS — Z79811 Long term (current) use of aromatase inhibitors: Secondary | ICD-10-CM | POA: Insufficient documentation

## 2023-10-04 DIAGNOSIS — Z1721 Progesterone receptor positive status: Secondary | ICD-10-CM | POA: Insufficient documentation

## 2023-10-04 LAB — CBC WITH DIFFERENTIAL (CANCER CENTER ONLY)
Abs Immature Granulocytes: 0.03 10*3/uL (ref 0.00–0.07)
Basophils Absolute: 0.1 10*3/uL (ref 0.0–0.1)
Basophils Relative: 1 %
Eosinophils Absolute: 0.5 10*3/uL (ref 0.0–0.5)
Eosinophils Relative: 6 %
HCT: 42 % (ref 36.0–46.0)
Hemoglobin: 13.2 g/dL (ref 12.0–15.0)
Immature Granulocytes: 0 %
Lymphocytes Relative: 21 %
Lymphs Abs: 1.6 10*3/uL (ref 0.7–4.0)
MCH: 29.7 pg (ref 26.0–34.0)
MCHC: 31.4 g/dL (ref 30.0–36.0)
MCV: 94.4 fL (ref 80.0–100.0)
Monocytes Absolute: 0.5 10*3/uL (ref 0.1–1.0)
Monocytes Relative: 7 %
Neutro Abs: 5 10*3/uL (ref 1.7–7.7)
Neutrophils Relative %: 65 %
Platelet Count: 212 10*3/uL (ref 150–400)
RBC: 4.45 MIL/uL (ref 3.87–5.11)
RDW: 13.7 % (ref 11.5–15.5)
WBC Count: 7.7 10*3/uL (ref 4.0–10.5)
nRBC: 0 % (ref 0.0–0.2)

## 2023-10-04 LAB — CMP (CANCER CENTER ONLY)
ALT: 12 U/L (ref 0–44)
AST: 14 U/L — ABNORMAL LOW (ref 15–41)
Albumin: 4 g/dL (ref 3.5–5.0)
Alkaline Phosphatase: 76 U/L (ref 38–126)
Anion gap: 4 — ABNORMAL LOW (ref 5–15)
BUN: 12 mg/dL (ref 8–23)
CO2: 28 mmol/L (ref 22–32)
Calcium: 9.4 mg/dL (ref 8.9–10.3)
Chloride: 107 mmol/L (ref 98–111)
Creatinine: 1.06 mg/dL — ABNORMAL HIGH (ref 0.44–1.00)
GFR, Estimated: 55 mL/min — ABNORMAL LOW (ref >60)
Glucose, Bld: 137 mg/dL — ABNORMAL HIGH (ref 70–99)
Potassium: 4.5 mmol/L (ref 3.5–5.1)
Sodium: 139 mmol/L (ref 135–145)
Total Bilirubin: 0.9 mg/dL (ref 0.0–1.2)
Total Protein: 8 g/dL (ref 6.5–8.1)

## 2023-10-04 NOTE — Telephone Encounter (Signed)
 Reached out to [facility] for [employer] at [facility], per [employer]. Unable to reach anyone. Left detailed message regarding records request for patient's most recent bone density report, per Heather Boscia,NP. Awaiting callback and or records at this time.

## 2023-10-04 NOTE — Progress Notes (Signed)
    Pt seen by Vincent Gros, NP

## 2023-10-04 NOTE — Assessment & Plan Note (Signed)
 invasive lobular carcinoma, Stage IA, pT2N0M0, G2, ER (+), PR (+), HER2 negative -Diagnosed in 01/2018. S/p right breast lumpectomy and adjuvant radiation.  -Her Oncotype score showed low risk with recurrence score 17, adjuvant chemotherapy was not recommended  -She started anti-estrogen therapy in 07/2018. She has tried Anastrozole, Tamoxifen, then Letrozole but switched due to AE's (arthrlagia, vaginal itching, insomnia, nightmares).  Tolerating exemestane much better -She is concerned about her hair loss from exemestane.  She completed 5 years therapy in December 2024.  Due to the side effects, I did not recommend extended antiestrogen therapy. -She is clinically doing well, exam was unremarkable, there is no clinical concern for recurrence. -Follow-up in 6 months with labs,  then annually. -new mammogram should be scheduled for 03/2024. Will inquire about dexa scan from GYN provider.

## 2023-10-05 ENCOUNTER — Other Ambulatory Visit: Payer: Self-pay

## 2023-10-05 ENCOUNTER — Telehealth: Payer: Self-pay

## 2023-10-05 NOTE — Telephone Encounter (Signed)
 Reached out to Lisa Horton, per Lisa Horton. Spoke with Lisa Horton.  Requested a copy of patient's recent bone density report.  Lisa Horton reports a bone density report dated: 03/05/2022. Lisa Horton states she will fax this report to our office fax # provided to her, [phone]. Awaiting report.

## 2023-10-07 ENCOUNTER — Encounter: Payer: Self-pay | Admitting: Family Medicine

## 2023-10-12 DIAGNOSIS — E1122 Type 2 diabetes mellitus with diabetic chronic kidney disease: Secondary | ICD-10-CM | POA: Diagnosis not present

## 2023-10-12 DIAGNOSIS — E559 Vitamin D deficiency, unspecified: Secondary | ICD-10-CM | POA: Diagnosis not present

## 2023-10-12 DIAGNOSIS — E039 Hypothyroidism, unspecified: Secondary | ICD-10-CM | POA: Diagnosis not present

## 2023-10-12 DIAGNOSIS — I129 Hypertensive chronic kidney disease with stage 1 through stage 4 chronic kidney disease, or unspecified chronic kidney disease: Secondary | ICD-10-CM | POA: Diagnosis not present

## 2023-10-12 DIAGNOSIS — N1831 Chronic kidney disease, stage 3a: Secondary | ICD-10-CM | POA: Diagnosis not present

## 2023-10-12 DIAGNOSIS — R413 Other amnesia: Secondary | ICD-10-CM | POA: Diagnosis not present

## 2023-11-10 DIAGNOSIS — C50911 Malignant neoplasm of unspecified site of right female breast: Secondary | ICD-10-CM | POA: Diagnosis not present

## 2023-11-11 DIAGNOSIS — C50911 Malignant neoplasm of unspecified site of right female breast: Secondary | ICD-10-CM | POA: Diagnosis not present

## 2023-11-17 DIAGNOSIS — L309 Dermatitis, unspecified: Secondary | ICD-10-CM | POA: Diagnosis not present

## 2023-11-23 DIAGNOSIS — C50911 Malignant neoplasm of unspecified site of right female breast: Secondary | ICD-10-CM | POA: Diagnosis not present

## 2023-12-21 DIAGNOSIS — E559 Vitamin D deficiency, unspecified: Secondary | ICD-10-CM | POA: Diagnosis not present

## 2023-12-21 DIAGNOSIS — I129 Hypertensive chronic kidney disease with stage 1 through stage 4 chronic kidney disease, or unspecified chronic kidney disease: Secondary | ICD-10-CM | POA: Diagnosis not present

## 2023-12-21 DIAGNOSIS — N1831 Chronic kidney disease, stage 3a: Secondary | ICD-10-CM | POA: Diagnosis not present

## 2023-12-21 DIAGNOSIS — E1122 Type 2 diabetes mellitus with diabetic chronic kidney disease: Secondary | ICD-10-CM | POA: Diagnosis not present

## 2023-12-21 DIAGNOSIS — E039 Hypothyroidism, unspecified: Secondary | ICD-10-CM | POA: Diagnosis not present

## 2023-12-28 DIAGNOSIS — E039 Hypothyroidism, unspecified: Secondary | ICD-10-CM | POA: Diagnosis not present

## 2023-12-28 DIAGNOSIS — E559 Vitamin D deficiency, unspecified: Secondary | ICD-10-CM | POA: Diagnosis not present

## 2023-12-28 DIAGNOSIS — E7800 Pure hypercholesterolemia, unspecified: Secondary | ICD-10-CM | POA: Diagnosis not present

## 2023-12-28 DIAGNOSIS — Z853 Personal history of malignant neoplasm of breast: Secondary | ICD-10-CM | POA: Diagnosis not present

## 2023-12-28 DIAGNOSIS — J309 Allergic rhinitis, unspecified: Secondary | ICD-10-CM | POA: Diagnosis not present

## 2023-12-28 DIAGNOSIS — E1122 Type 2 diabetes mellitus with diabetic chronic kidney disease: Secondary | ICD-10-CM | POA: Diagnosis not present

## 2023-12-28 DIAGNOSIS — R634 Abnormal weight loss: Secondary | ICD-10-CM | POA: Diagnosis not present

## 2023-12-28 DIAGNOSIS — I129 Hypertensive chronic kidney disease with stage 1 through stage 4 chronic kidney disease, or unspecified chronic kidney disease: Secondary | ICD-10-CM | POA: Diagnosis not present

## 2023-12-28 DIAGNOSIS — R109 Unspecified abdominal pain: Secondary | ICD-10-CM | POA: Diagnosis not present

## 2024-01-10 DIAGNOSIS — H2513 Age-related nuclear cataract, bilateral: Secondary | ICD-10-CM | POA: Diagnosis not present

## 2024-01-10 DIAGNOSIS — H40053 Ocular hypertension, bilateral: Secondary | ICD-10-CM | POA: Diagnosis not present

## 2024-01-10 DIAGNOSIS — H524 Presbyopia: Secondary | ICD-10-CM | POA: Diagnosis not present

## 2024-01-10 DIAGNOSIS — H43813 Vitreous degeneration, bilateral: Secondary | ICD-10-CM | POA: Diagnosis not present

## 2024-01-10 DIAGNOSIS — H5213 Myopia, bilateral: Secondary | ICD-10-CM | POA: Diagnosis not present

## 2024-01-26 ENCOUNTER — Ambulatory Visit: Admitting: Neurology

## 2024-02-29 ENCOUNTER — Other Ambulatory Visit (HOSPITAL_COMMUNITY)
Admission: RE | Admit: 2024-02-29 | Discharge: 2024-02-29 | Disposition: A | Discharge status: Home / Self Care | Source: Ambulatory Visit | Attending: Obstetrics & Gynecology | Admitting: Obstetrics & Gynecology

## 2024-02-29 ENCOUNTER — Ambulatory Visit (HOSPITAL_BASED_OUTPATIENT_CLINIC_OR_DEPARTMENT_OTHER): Admitting: Obstetrics & Gynecology

## 2024-02-29 VITALS — BP 124/72 | HR 84 | Wt 210.0 lb

## 2024-02-29 DIAGNOSIS — N952 Postmenopausal atrophic vaginitis: Secondary | ICD-10-CM

## 2024-02-29 DIAGNOSIS — N898 Other specified noninflammatory disorders of vagina: Secondary | ICD-10-CM | POA: Insufficient documentation

## 2024-02-29 NOTE — Progress Notes (Unsigned)
 GYNECOLOGY  VISIT  CC:   vaginal irritation/dryness  HPI: 78 y.o. G36P2002 Single Black or African American female here for complaint of vaginal dryness and irritation.  She is also having a little itching.  She denies any urinary symptoms.  Has had symptoms like this off and on the past two years.  We have discussed using vaginal estrogen but she has a hx of breast cancer and doesn't really want to use an hormone if possible.  She has tried Vit E vaginal suppositories but stopped using them.  Denies vaginal bleeding or discharge.  No pain.  She has been using vaseline to help the dryness sensation and hopes that was ok.   Past Medical History:  Diagnosis Date   Acid reflux    Allergic rhinitis    Arthritis    Asthma    Bruises easily    Cancer (HCC)    rt breast cancer   Chicken pox    Colon polyp    Family history of breast cancer    Family history of pancreatic cancer    Family history of prostate cancer    Gum disease    Hypertension    Hypothyroidism    Incontinence    Measles    Myopathy    arms and legs, statin drug related   Prediabetes 06/2014    MEDS:   Current Outpatient Medications on File Prior to Visit  Medication Sig Dispense Refill   betamethasone  valerate ointment (VALISONE ) 0.1 % Apply 1 Application topically 2 (two) times daily. Use for up to 2 weeks 30 g 0   cetirizine (ZYRTEC) 10 MG tablet Take 10 mg by mouth daily.     irbesartan (AVAPRO) 300 MG tablet Take 300 mg by mouth daily.      levothyroxine (SYNTHROID) 125 MCG tablet Take 125 mcg by mouth daily.     spironolactone (ALDACTONE) 50 MG tablet Take 50 mg by mouth daily. Taking 1/2 pill daily     Vitamin D, Ergocalciferol, (DRISDOL) 50000 units CAPS capsule Take 1 capsule by mouth once a week.  3   exemestane  (AROMASIN ) 25 MG tablet TAKE 1 TABLET BY MOUTH ONCE DAILY AFTER BREAKFAST (Patient not taking: Reported on 02/29/2024) 30 tablet 0   terconazole  (TERAZOL 7 ) 0.4 % vaginal cream Place 1  applicator vaginally at bedtime. Apply externally as directed as well. (Patient not taking: Reported on 02/29/2024) 45 g 0   No current facility-administered medications on file prior to visit.    ALLERGIES: Statins, Adhesive [tape], Epinephrine, Hydrogen peroxide, Lidocaine , Metrogel [metronidazole], Tamoxifen , Latex, Neomycin-bacitracin zn-polymyx, and Sulfa antibiotics  SH:  single, non smoker  Review of Systems  Constitutional: Negative.   Genitourinary: Negative.     PHYSICAL EXAMINATION:    BP 124/72 (BP Location: Left Arm, Patient Position: Sitting, Cuff Size: Large)   Pulse 84   Wt 210 lb (95.3 kg)   LMP 08/03/1984 (Approximate)   SpO2 97%   BMI 38.41 kg/m     General appearance: alert, cooperative and appears stated age  Lymph:  no inguinal LAD noted Pelvic: External genitalia:  no lesions              Urethra:  normal appearing urethra with no masses, tenderness or lesions              Bartholins and Skenes: normal                 Vagina: atrophic mucosa, no lesions, whitish discharge present  Cervix: absent              Bimanual Exam:  Uterus:  uterus absent, no masses   Chaperone was present for exam.  Assessment/Plan: 1. Vaginal discharge (Primary) - will check for yeast and BV.  If negative, will treat for atrophic changes.  Will try non-hormonal options for pt - Cervicovaginal ancillary only( [place])  2. Vaginal atrophy  3. Vaginal irritation

## 2024-03-01 LAB — CERVICOVAGINAL ANCILLARY ONLY
Bacterial Vaginitis (gardnerella): NEGATIVE
Candida Glabrata: NEGATIVE
Candida Vaginitis: NEGATIVE
Comment: NEGATIVE
Comment: NEGATIVE
Comment: NEGATIVE

## 2024-03-03 ENCOUNTER — Ambulatory Visit (HOSPITAL_BASED_OUTPATIENT_CLINIC_OR_DEPARTMENT_OTHER): Payer: Self-pay | Admitting: Obstetrics & Gynecology

## 2024-03-03 ENCOUNTER — Encounter (HOSPITAL_BASED_OUTPATIENT_CLINIC_OR_DEPARTMENT_OTHER): Payer: Self-pay | Admitting: Obstetrics & Gynecology

## 2024-03-10 DIAGNOSIS — M810 Age-related osteoporosis without current pathological fracture: Secondary | ICD-10-CM | POA: Diagnosis not present

## 2024-03-10 DIAGNOSIS — Z1231 Encounter for screening mammogram for malignant neoplasm of breast: Secondary | ICD-10-CM | POA: Diagnosis not present

## 2024-03-23 ENCOUNTER — Ambulatory Visit (HOSPITAL_BASED_OUTPATIENT_CLINIC_OR_DEPARTMENT_OTHER): Payer: Self-pay | Admitting: Obstetrics & Gynecology

## 2024-03-23 ENCOUNTER — Encounter (HOSPITAL_BASED_OUTPATIENT_CLINIC_OR_DEPARTMENT_OTHER): Payer: Self-pay | Admitting: Obstetrics & Gynecology

## 2024-04-05 ENCOUNTER — Other Ambulatory Visit: Payer: Self-pay

## 2024-04-05 DIAGNOSIS — C50411 Malignant neoplasm of upper-outer quadrant of right female breast: Secondary | ICD-10-CM

## 2024-04-06 ENCOUNTER — Inpatient Hospital Stay: Admitting: Nurse Practitioner

## 2024-04-06 ENCOUNTER — Inpatient Hospital Stay: Attending: Nurse Practitioner

## 2024-04-06 VITALS — BP 138/76 | HR 81 | Temp 98.1°F | Resp 17 | Wt 214.8 lb

## 2024-04-06 DIAGNOSIS — C50411 Malignant neoplasm of upper-outer quadrant of right female breast: Secondary | ICD-10-CM

## 2024-04-06 DIAGNOSIS — Z170 Estrogen receptor positive status [ER+]: Secondary | ICD-10-CM | POA: Insufficient documentation

## 2024-04-06 DIAGNOSIS — Z79811 Long term (current) use of aromatase inhibitors: Secondary | ICD-10-CM | POA: Diagnosis not present

## 2024-04-06 LAB — CBC WITH DIFFERENTIAL (CANCER CENTER ONLY)
Abs Immature Granulocytes: 0.02 K/uL (ref 0.00–0.07)
Basophils Absolute: 0.1 K/uL (ref 0.0–0.1)
Basophils Relative: 1 %
Eosinophils Absolute: 0.3 K/uL (ref 0.0–0.5)
Eosinophils Relative: 4 %
HCT: 37.4 % (ref 36.0–46.0)
Hemoglobin: 12 g/dL (ref 12.0–15.0)
Immature Granulocytes: 0 %
Lymphocytes Relative: 25 %
Lymphs Abs: 2 K/uL (ref 0.7–4.0)
MCH: 29.6 pg (ref 26.0–34.0)
MCHC: 32.1 g/dL (ref 30.0–36.0)
MCV: 92.1 fL (ref 80.0–100.0)
Monocytes Absolute: 0.5 K/uL (ref 0.1–1.0)
Monocytes Relative: 6 %
Neutro Abs: 5 K/uL (ref 1.7–7.7)
Neutrophils Relative %: 64 %
Platelet Count: 203 K/uL (ref 150–400)
RBC: 4.06 MIL/uL (ref 3.87–5.11)
RDW: 13.4 % (ref 11.5–15.5)
WBC Count: 7.8 K/uL (ref 4.0–10.5)
nRBC: 0 % (ref 0.0–0.2)

## 2024-04-06 LAB — CMP (CANCER CENTER ONLY)
ALT: 12 U/L (ref 0–44)
AST: 15 U/L (ref 15–41)
Albumin: 3.9 g/dL (ref 3.5–5.0)
Alkaline Phosphatase: 60 U/L (ref 38–126)
Anion gap: 6 (ref 5–15)
BUN: 12 mg/dL (ref 8–23)
CO2: 26 mmol/L (ref 22–32)
Calcium: 9.2 mg/dL (ref 8.9–10.3)
Chloride: 105 mmol/L (ref 98–111)
Creatinine: 0.97 mg/dL (ref 0.44–1.00)
GFR, Estimated: >60 mL/min (ref >60)
Glucose, Bld: 142 mg/dL — ABNORMAL HIGH (ref 70–99)
Potassium: 4.6 mmol/L (ref 3.5–5.1)
Sodium: 137 mmol/L (ref 135–145)
Total Bilirubin: 0.6 mg/dL (ref 0.0–1.2)
Total Protein: 8 g/dL (ref 6.5–8.1)

## 2024-04-06 NOTE — Progress Notes (Signed)
 Patient Care Team: Cleotilde Ronal RAMAN, MD as PCP - General (Obstetrics and Gynecology) Curvin Deward MOULD, MD as Consulting Physician (General Surgery) Lanny Callander, MD as Consulting Physician (Hematology) Shannon Agent, MD as Consulting Physician (Radiation Oncology) Burton, Lacie K, NP as Nurse Practitioner (Nurse Practitioner)  Clinic Day:  04/06/2024  Referring physician: Cleotilde Ronal RAMAN, MD  ASSESSMENT & PLAN:   Assessment & Plan: Malignant neoplasm of upper-outer quadrant of right breast in female, estrogen receptor positive (HCC)  invasive lobular carcinoma, Stage IA, pT2N0M0, G2, ER (+), PR (+), HER2 negative -Diagnosed in 01/2018. S/p right breast lumpectomy and adjuvant radiation.  -Her Oncotype score showed low risk with recurrence score 17, adjuvant chemotherapy was not recommended  -She started anti-estrogen therapy in 07/2018. She has tried Anastrozole , Tamoxifen , then Letrozole  but switched due to AE's (arthrlagia, vaginal itching, insomnia, nightmares).  Tolerating exemestane  much better -She is concerned about her hair loss from exemestane .  She completed 5 years therapy in December 2024.  Due to the side effects, I did not recommend extended antiestrogen therapy. -She is clinically doing well, exam was unremarkable, there is no clinical concern for recurrence. -Follow-up in 6 months with labs,  then annually. -new mammogram should be scheduled for 03/2024. Will inquire about dexa scan from GYN provider.   Osteoporosis DEXA scan done 03/10/2024 showed generalized osteoporosis.  We discussed risk of pathological fractures related to osteoporosis.  Encouraged starting bisphosphonates, either Fosamax or Boniva.  Subcutaneous Prolia every 6 months, also discussed possible treatment.  Patient does not wish to start these medications.  Has appointment with primary care in the next few weeks, and would like to speak with them about results and recommended treatment.  In the meantime, she will take  calcium and vitamin D every day.  Will start training with light weights and begin participating in low impact exercises.  Plan: Labs reviewed. -unremarkable CBC and CMP are unremarkable.  Reviewed recent DEXA scan showing osteoporosis.  --declines bisphosphonate treatment for now.  Continue breast cancer surveillance. Labs and follow up in 1 year, sooner if needed.   The patient understands the plans discussed today and is in agreement with them.  She knows to contact our office if she develops concerns prior to her next appointment.  I provided 25 minutes of face-to-face time during this encounter and > 50% was spent counseling as documented under my assessment and plan.    Powell FORBES Lessen, NP  [facility] - A DEPT OF [facility] [address] [place] [address] Dept: [phone] Dept Fax: [phone]   No orders of the defined types were placed in this encounter.     CHIEF COMPLAINT:  CC: Right breast cancer, ER +  Current Treatment:  cancer surveillance   INTERVAL HISTORY:  Jazmynn is here today for repeat clinical assessment. She was last seen by me on 10/04/2023.  Continues to do well.  No changes in antibiotics.  Recently had DEXA scan showing osteoporosis.  She had mammogram which was negative.  She denies chest pain, chest pressure, or shortness of breath. She denies headaches or visual disturbances. She denies abdominal pain, nausea, vomiting, or changes in bowel or bladder habits.  She denies fevers or chills. She denies pain. Her appetite is good. Her weight has increased 3 pounds over last 6 months.  I have reviewed the past medical history, past surgical history, social history and family history with the patient  and they are unchanged from previous note.  ALLERGIES:  is allergic to statins, adhesive [tape], epinephrine, hydrogen peroxide, lidocaine , metrogel [metronidazole], tamoxifen , latex,  neomycin-bacitracin zn-polymyx, and sulfa antibiotics.  MEDICATIONS:  Current Outpatient Medications  Medication Sig Dispense Refill   betamethasone  valerate ointment (VALISONE ) 0.1 % Apply 1 Application topically 2 (two) times daily. Use for up to 2 weeks 30 g 0   cetirizine (ZYRTEC) 10 MG tablet Take 10 mg by mouth daily.     irbesartan (AVAPRO) 300 MG tablet Take 300 mg by mouth daily.      levothyroxine (SYNTHROID) 125 MCG tablet Take 125 mcg by mouth daily.     spironolactone (ALDACTONE) 50 MG tablet Take 50 mg by mouth daily. Taking 1/2 pill daily     Vitamin D, Ergocalciferol, (DRISDOL) 50000 units CAPS capsule Take 1 capsule by mouth once a week.  3   No current facility-administered medications for this visit.    HISTORY OF PRESENT ILLNESS:   Oncology History Overview Note  Cancer Staging Malignant neoplasm of upper-outer quadrant of right breast in female, estrogen receptor positive (HCC) Staging form: Breast, AJCC 8th Edition - Clinical stage from 01/04/2018: Stage IA (cT1c, cN0, cM0, G2, ER+, PR+, HER2-) - Signed by Lanny Callander, MD on 01/11/2018 - Pathologic: Stage IA (pT2, pN0, cM0, G2, ER+, PR+, HER2-) - Signed by Odean Potts, MD on 06/21/2018     Malignant neoplasm of upper-outer quadrant of right breast in female, estrogen receptor positive (HCC)  12/28/2017 Mammogram   Bilateral diagnostic mammography with tomography and right breast ultrasonography at [facility] on 12/28/2017 showing: The irregular architectural distortion in the right breast upper outer quadrant posterior depth is indeterminate. The architectural distortion in the right breast upper outer quadrant middle depth is indeterminate.    01/03/2018 Pathology Results   Right needle core biopsy with pathology showing: Breast, right, needle core biopsy, (A) 11 o'clock with invasive mammary carcinoma, grade I-II. Breast, right, needle core biopsy, (B) 11 o'clock with invasive mammary carcinoma, grade I-II. Prognostic  indicators significant for: ER, 60% positive with moderate staining intensity and PR, 90% positive with strong staining intensity. Proliferation marker Ki67 at 1%. HER2 negative.   01/04/2018 Cancer Staging   Staging form: Breast, AJCC 8th Edition - Clinical stage from 01/04/2018: Stage IA (cT1c, cN0, cM0, G2, ER+, PR+, HER2-) - Signed by Lanny Callander, MD on 01/11/2018   01/25/2018 Genetic Testing   The Multi-Cancer Panel offered by [facility] includes sequencing and/or deletion duplication testing of the following 83 genes: ALK, APC, ATM, AXIN2,BAP1,  BARD1, BLM, BMPR1A, BRCA1, BRCA2, BRIP1, CASR, CDC73, CDH1, CDK4, CDKN1B, CDKN1C, CDKN2A (p14ARF), CDKN2A (p16INK4a), CEBPA, CHEK2, CTNNA1, DICER1, DIS3L2, EGFR (c.2369C>T, p.Thr790Met variant only), EPCAM (Deletion/duplication testing only), FH, FLCN, GATA2, GPC3, GREM1 (Promoter region deletion/duplication testing only), HOXB13 (c.251G>A, p.Gly84Glu), HRAS, KIT, MAX, MEN1, MET, MITF (c.952G>A, p.Glu318Lys variant only), MLH1, MSH2, MSH3, MSH6, MUTYH, NBN, NF1, NF2, NTHL1, PALB2, PDGFRA, PHOX2B, PMS2, POLD1, POLE, POT1, PRKAR1A, PTCH1, PTEN, RAD50, RAD51C, RAD51D, RB1, RECQL4, RET, RUNX1, SDHAF2, SDHA (sequence changes only), SDHB, SDHC, SDHD, SMAD4, SMARCA4, SMARCB1, SMARCE1, STK11, SUFU, TERC, TERT, TMEM127, TP53, TSC1, TSC2, VHL, WRN and WT1.   Results: No pathogenic variants identified.  A Variant of Uncertain significance in BAP1 was identified c.1066C>T (p.Arg356Trp).  The date of this test report is 01/25/2018.    03/09/2018 Surgery   Right lumpectomy: ILC grade 2, 2 foci, 2 cm, 2.5 cm, superior margin positive, 0/5 lymph nodes negative, ER 60%, PR 90%, Ki-67 1%, HER-2  negative T2N0 stage Ia   03/09/2018 Oncotype testing   Recurrence score 17 Distant risk of recurrence at 9 years with AI or Tamoxifen  alone is 5% There is a <1% benefit of chemotherapy   03/31/2018 Pathology Results   Reexcision: No residual cancer   05/09/2018 - 07/08/2018 Radiation Therapy    Radiation with Dr. Shannon 05/09/18- 07/08/18    06/21/2018 Cancer Staging   Staging form: Breast, AJCC 8th Edition - Pathologic: Stage IA (pT2, pN0, cM0, G2, ER+, PR+, HER2-) - Signed by Odean Potts, MD on 06/21/2018   07/2018 -  Anti-estrogen oral therapy   Anastrozole  1mg  daily starting in 07/2018. Stopped in early 03/2019 due to joint pain. Switched to Tamoxifen  in 08/2019. Switched to Letrozole  in 04/2020 due to joint pain and vignal itching -She was not able to try Exemestane  due to copay    Survivorship   Per Lacie Burton, NP        REVIEW OF SYSTEMS:   Constitutional: Denies fevers, chills or abnormal weight loss Eyes: Denies blurriness of vision Ears, nose, mouth, throat, and face: Denies mucositis or sore throat Respiratory: Denies cough, dyspnea or wheezes Cardiovascular: Denies palpitation, chest discomfort or lower extremity swelling Gastrointestinal:  Denies nausea, heartburn or change in bowel habits Skin: Denies abnormal skin rashes Lymphatics: Denies new lymphadenopathy or easy bruising Neurological:Denies numbness, tingling or new weaknesses Behavioral/Psych: Mood is stable, no new changes  All other systems were reviewed with the patient and are negative.   VITALS:   Today's Vitals   04/06/24 1314 04/06/24 1319  BP: 138/76   Pulse: 81   Resp: 17   Temp: 98.1 F (36.7 C)   SpO2: 98%   Weight: 214 lb 12.8 oz (97.4 kg)   PainSc:  5    Body mass index is 39.29 kg/m.   Wt Readings from Last 3 Encounters:  04/06/24 214 lb 12.8 oz (97.4 kg)  02/29/24 210 lb (95.3 kg)  10/04/23 211 lb 1.6 oz (95.8 kg)    Body mass index is 39.29 kg/m.  Performance status (ECOG): 1 - Symptomatic but completely ambulatory  PHYSICAL EXAM:   GENERAL:alert, no distress and comfortable SKIN: skin color, texture, turgor are normal, no rashes or significant lesions EYES: normal, Conjunctiva are pink and non-injected, sclera clear OROPHARYNX:no exudate, no erythema and  lips, buccal mucosa, and tongue normal  NECK: supple, thyroid  normal size, non-tender, without nodularity LYMPH:  no palpable lymphadenopathy in the cervical, axillary or inguinal LUNGS: clear to auscultation and percussion with normal breathing effort HEART: regular rate & rhythm and no murmurs and no lower extremity edema ABDOMEN:abdomen soft, non-tender and normal bowel sounds Musculoskeletal:no cyanosis of digits and no clubbing  NEURO: alert & oriented x 3 with fluent speech, no focal motor/sensory deficits BREAST: Well-healed lumpectomy scar on the right. There are no palpable lumps or masses noted on the right.  Expected radiation changes to the skin.  There is no nipple inversion or nipple discharge.  There is no axillary lymphadenopathy on the right.  There are no Masses or lumps in the left breast.  There is no nipple inversion or nipple discharge.  There is no axillary lymphadenopathy on the left.  LABORATORY DATA:  I have reviewed the data as listed    Component Value Date/Time   NA 137 04/06/2024 1252   K 4.6 04/06/2024 1252   CL 105 04/06/2024 1252   CO2 26 04/06/2024 1252   GLUCOSE 142 (H) 04/06/2024 1252   BUN 12 04/06/2024  1252   CREATININE 0.97 04/06/2024 1252   CALCIUM 9.2 04/06/2024 1252   PROT 8.0 04/06/2024 1252   ALBUMIN 3.9 04/06/2024 1252   AST 15 04/06/2024 1252   ALT 12 04/06/2024 1252   ALKPHOS 60 04/06/2024 1252   BILITOT 0.6 04/06/2024 1252   GFRNONAA >60 04/06/2024 1252   GFRAA 54 (L) 08/14/2019 1315     Lab Results  Component Value Date   WBC 7.8 04/06/2024   NEUTROABS 5.0 04/06/2024   HGB 12.0 04/06/2024   HCT 37.4 04/06/2024   MCV 92.1 04/06/2024   PLT 203 04/06/2024

## 2024-04-06 NOTE — Assessment & Plan Note (Addendum)
 invasive lobular carcinoma, Stage IA, pT2N0M0, G2, ER (+), PR (+), HER2 negative -Diagnosed in 01/2018. S/p right breast lumpectomy and adjuvant radiation.  -Her Oncotype score showed low risk with recurrence score 17, adjuvant chemotherapy was not recommended  -She started anti-estrogen therapy in 07/2018. She has tried Anastrozole , Tamoxifen , then Letrozole  but switched due to AE's (arthrlagia, vaginal itching, insomnia, nightmares).  Tolerating exemestane  much better -She is concerned about her hair loss from exemestane .  She completed 5 years therapy in December 2024.  Due to the side effects, I did not recommend extended antiestrogen therapy. -She is clinically doing well, exam was unremarkable, there is no clinical concern for recurrence. --new mammogram from 03/2024 was benign.  --DEXA scan from 03/2024 showing osteoporosis.  Will send labs, DEXA scan, and mammogram results to primary care provider. Labs and follow up in 1 year, sooner if needed.

## 2024-04-09 ENCOUNTER — Encounter: Payer: Self-pay | Admitting: Nurse Practitioner

## 2024-04-11 ENCOUNTER — Ambulatory Visit: Admitting: Physician Assistant

## 2024-04-11 ENCOUNTER — Encounter: Payer: Self-pay | Admitting: Physician Assistant

## 2024-04-11 VITALS — BP 119/76 | HR 90

## 2024-04-11 DIAGNOSIS — W908XXA Exposure to other nonionizing radiation, initial encounter: Secondary | ICD-10-CM | POA: Diagnosis not present

## 2024-04-11 DIAGNOSIS — Z1283 Encounter for screening for malignant neoplasm of skin: Secondary | ICD-10-CM | POA: Diagnosis not present

## 2024-04-11 DIAGNOSIS — D229 Melanocytic nevi, unspecified: Secondary | ICD-10-CM

## 2024-04-11 DIAGNOSIS — L821 Other seborrheic keratosis: Secondary | ICD-10-CM

## 2024-04-11 DIAGNOSIS — L814 Other melanin hyperpigmentation: Secondary | ICD-10-CM | POA: Diagnosis not present

## 2024-04-11 DIAGNOSIS — L578 Other skin changes due to chronic exposure to nonionizing radiation: Secondary | ICD-10-CM

## 2024-04-11 DIAGNOSIS — D1801 Hemangioma of skin and subcutaneous tissue: Secondary | ICD-10-CM

## 2024-04-11 NOTE — Patient Instructions (Signed)

## 2024-04-11 NOTE — Progress Notes (Signed)
   New Patient Visit   Subjective  Lisa Horton is a 78 y.o. female NEW PATIENT who presents for the following: multiple moles/spots  Patient states she has a few moles on her face and legs and some hyperpigmentation on her back. Patient reports the areas have been there for 1 year on her face and legs and the spots on her back 2 months. She reports the areas are not bothersome. She states that the areas have spread. Patient reports she has not previously been treated for these areas. Patient denies Hx of bx. Patient denies family history of skin cancer(s).  The patient has spots, moles and lesions to be evaluated, some may be new or changing and the patient may have concern these could be cancer.   The following portions of the chart were reviewed this encounter and updated as appropriate: medications, allergies, medical history  Review of Systems:  No other skin or systemic complaints except as noted in HPI or Assessment and Plan.  Objective  Well appearing patient in no apparent distress; mood and affect are within normal limits.  A full examination was performed including scalp, head, eyes, ears, nose, lips, neck, chest, axillae, abdomen, back, buttocks, bilateral upper extremities, bilateral lower extremities, hands, feet, fingers, toes, fingernails, and toenails. All findings within normal limits unless otherwise noted below.    Relevant exam findings are noted in the Assessment and Plan.    Assessment & Plan   LENTIGINES, SEBORRHEIC KERATOSES, HEMANGIOMAS - Benign normal skin lesions - Benign-appearing - Call for any changes  MELANOCYTIC NEVI - Tan-brown and/or pink-flesh-colored symmetric macules and papules - Benign appearing on exam today - Observation - Call clinic for new or changing moles - Recommend daily use of broad spectrum spf 30+ sunscreen to sun-exposed areas.   ACTINIC DAMAGE - Chronic condition, secondary to cumulative UV/sun exposure - diffuse scaly  erythematous macules with underlying dyspigmentation - Recommend daily broad spectrum sunscreen SPF 30+ to sun-exposed areas, reapply every 2 hours as needed.  - Staying in the shade or wearing long sleeves, sun glasses (UVA+UVB protection) and wide brim hats (4-inch brim around the entire circumference of the hat) are also recommended for sun protection.  - Call for new or changing lesions.  SKIN CANCER SCREENING PERFORMED TODAY    LENTIGINES   SEBORRHEIC KERATOSIS   CHERRY ANGIOMA   MULTIPLE BENIGN NEVI   ACTINIC SKIN DAMAGE   SCREENING EXAM FOR SKIN CANCER    Return if symptoms worsen or fail to improve.  I, Doyce Pan, CMA, am acting as scribe for Keller Bounds K, PA-C.   Documentation: I have reviewed the above documentation for accuracy and completeness, and I agree with the above.  Cristobal Advani K, PA-C

## 2024-04-12 DIAGNOSIS — Z0000 Encounter for general adult medical examination without abnormal findings: Secondary | ICD-10-CM | POA: Diagnosis not present

## 2024-04-12 DIAGNOSIS — I129 Hypertensive chronic kidney disease with stage 1 through stage 4 chronic kidney disease, or unspecified chronic kidney disease: Secondary | ICD-10-CM | POA: Diagnosis not present

## 2024-04-12 DIAGNOSIS — Z23 Encounter for immunization: Secondary | ICD-10-CM | POA: Diagnosis not present

## 2024-04-12 DIAGNOSIS — E559 Vitamin D deficiency, unspecified: Secondary | ICD-10-CM | POA: Diagnosis not present

## 2024-04-12 DIAGNOSIS — G4700 Insomnia, unspecified: Secondary | ICD-10-CM | POA: Diagnosis not present

## 2024-04-12 DIAGNOSIS — E039 Hypothyroidism, unspecified: Secondary | ICD-10-CM | POA: Diagnosis not present

## 2024-04-12 DIAGNOSIS — E7800 Pure hypercholesterolemia, unspecified: Secondary | ICD-10-CM | POA: Diagnosis not present

## 2024-04-12 DIAGNOSIS — M810 Age-related osteoporosis without current pathological fracture: Secondary | ICD-10-CM | POA: Diagnosis not present

## 2024-04-12 DIAGNOSIS — N1831 Chronic kidney disease, stage 3a: Secondary | ICD-10-CM | POA: Diagnosis not present

## 2024-04-12 DIAGNOSIS — E1122 Type 2 diabetes mellitus with diabetic chronic kidney disease: Secondary | ICD-10-CM | POA: Diagnosis not present

## 2024-05-10 ENCOUNTER — Encounter: Payer: Self-pay | Admitting: Neurology

## 2024-05-10 ENCOUNTER — Ambulatory Visit: Admitting: Neurology

## 2024-05-10 VITALS — BP 120/85 | HR 79 | Ht 62.0 in | Wt 212.0 lb

## 2024-05-10 DIAGNOSIS — R4189 Other symptoms and signs involving cognitive functions and awareness: Secondary | ICD-10-CM | POA: Diagnosis not present

## 2024-05-10 NOTE — Progress Notes (Signed)
 [facility]  PATIENT: Lisa Horton DOB: Feb 19, 1946  REQUESTING CLINICIAN: Gerome Brunet, DO HISTORY FROM: Patient  REASON FOR VISIT: Memory loss    HISTORICAL  CHIEF COMPLAINT:  Chief Complaint  Patient presents with   New Patient (Initial Visit)    Pt in room 12. Alone. paper referral for Possible Dementia, SLUMS 17/30. MMSE:26    HISTORY OF PRESENT ILLNESS:  Discussed the use of AI scribe software for clinical note transcription with the patient, who gave verbal consent to proceed.  Lisa Horton is a 78 year old female with past medical history of hypertension, hypothyroidism who was referred by PCP for memory test after a poor SLUMS exam. She tells me at that time; she was suffering from insomnia and has not slept for 3 days, reason why her score was low.    She experiences sleep disturbances characterized by an inability to sleep for three consecutive days. She lies down and attempts to sleep but remains awake until morning. This pattern has occurred once or twice before. She attributes the lack of sleep to watching TV but notes that she eventually starts to sleep after the third or fourth day.   Since then, her sleep has returned to normal and she feels back to her normal self, no complaints.    She denies any concerns with her memory. Tells me that she lives with her daughter who is not concerned about her memory. She independent in all her ADLs and IADLs. She presented today alone. Again, no complains.   She denies any concerns about her memory and states that her daughter has not expressed any concerns either. She manages her daily activities independently, including handling bills and driving, without any issues.   No recent falls.    OTHER MEDICAL CONDITIONS: Hypertension, Hypothyroidism   REVIEW OF SYSTEMS: Full 14 system review of systems performed and negative with exception of: As noted in the HPI   ALLERGIES: Allergies  Allergen  Reactions   Statins Other (See Comments)    Medication Crestor:   Urine became very dark within two days.   Nerve damage and myopathy as a result.    Adhesive [Tape]    Epinephrine Other (See Comments)   Hydrogen Peroxide Other (See Comments)    Blisters    Lidocaine  Other (See Comments)    Combination of Lidocaine  and Epinephrine during dental procedure caused her to nearly arrest.    Metrogel [Metronidazole] Other (See Comments)    Increase in vaginal burning.  Does better with Flagyl   Tamoxifen  Other (See Comments)   Latex Rash   Neomycin-Bacitracin Zn-Polymyx Rash   Sulfa Antibiotics Rash    HOME MEDICATIONS: Outpatient Medications Prior to Visit  Medication Sig Dispense Refill   betamethasone  valerate ointment (VALISONE ) 0.1 % Apply 1 Application topically 2 (two) times daily. Use for up to 2 weeks 30 g 0   cetirizine (ZYRTEC) 10 MG tablet Take 10 mg by mouth daily.     irbesartan (AVAPRO) 300 MG tablet Take 300 mg by mouth daily.      levothyroxine (SYNTHROID) 125 MCG tablet Take 125 mcg by mouth daily.     spironolactone (ALDACTONE) 50 MG tablet Take 50 mg by mouth daily. Taking 1/2 pill daily     Vitamin D, Ergocalciferol, (DRISDOL) 50000 units CAPS capsule Take 1 capsule by mouth once a week.  3   No facility-administered medications prior to visit.    PAST MEDICAL HISTORY: Past Medical History:  Diagnosis Date  Acid reflux    Allergic rhinitis    Arthritis    Asthma    Bruises easily    Cancer (HCC)    rt breast cancer   Chicken pox    Colon polyp    Family history of breast cancer    Family history of pancreatic cancer    Family history of prostate cancer    Gum disease    Hypertension    Hypothyroidism    Incontinence    Measles    Myopathy    arms and legs, statin drug related   Prediabetes 06/2014    PAST SURGICAL HISTORY: Past Surgical History:  Procedure Laterality Date   ABDOMINAL HYSTERECTOMY  1986   secondary to fibroids   BREAST  LUMPECTOMY WITH RADIOACTIVE SEED AND SENTINEL LYMPH NODE BIOPSY Right 03/09/2018   Procedure: RIGHT BREAST LUMPECTOMY WITH BRACKETED RADIOACTIVE SEED AND SENTINEL LYMPH NODE BIOPSY;  Surgeon: Ebbie Cough, MD;  Location: [facility];  Service: General;  Laterality: Right;   BREAST SURGERY  07/09/2008   mass removal   CHOLECYSTECTOMY  03/17/11   RE-EXCISION OF BREAST LUMPECTOMY Right 03/31/2018   Procedure: RE-EXCISION OF BREAST LUMPECTOMY;  Surgeon: Ebbie Cough, MD;  Location: [facility];  Service: General;  Laterality: Right;   SKIN TAG REMOVAL     brow and lid   THIGH / KNEE SOFT TISSUE BIOPSY  09/05/2009   TUBAL LIGATION  1980    FAMILY HISTORY: Family History  Problem Relation Age of Onset   Hypertension Mother    Pancreatic cancer Mother 52   Hypertension Father    Prostate cancer Father 58   Emphysema Father    Hypertension Sister    Breast cancer Sister        dx >50, caught very early   Hypertension Brother    Alzheimer's disease Brother    Hypertension Sister    Heart failure Brother    Hypertension Brother    Breast cancer Daughter 8       BRCA1/2 negative, never had panel testing   Allergies Sister    Allergies Brother    Throat cancer Maternal Uncle     SOCIAL HISTORY: Social History   Socioeconomic History   Marital status: Single    Spouse name: Not on file   Number of children: Not on file   Years of education: Not on file   Highest education level: Not on file  Occupational History   Not on file  Tobacco Use   Smoking status: Former    Current packs/day: 0.00    Average packs/day: 1 pack/day for 48.0 years (48.0 ttl pk-yrs)    Types: Cigarettes    Start date: 08/03/1960    Quit date: 08/03/2008    Years since quitting: 15.7   Smokeless tobacco: Never  Vaping Use   Vaping status: Never Used  Substance and Sexual Activity   Alcohol use: Not Currently    Alcohol/week: 0.0 - 1.0 standard drinks of alcohol   Drug  use: No   Sexual activity: Not Currently  Other Topics Concern   Not on file  Social History Narrative   Single, lives with daughter   2 children   Retired - Location manager   No recent travel      Adult nurse Pulmonary:   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 From [place] and has always lived in [place]. No international travel. No pets currently. No bird or mold exposure.    Social Drivers of Health  Financial Resource Strain: Not on file  Food Insecurity: No Food Insecurity (04/06/2024)   Hunger Vital Sign    Worried About Running Out of Food in the Last Year: Never true    Ran Out of Food in the Last Year: Never true  Transportation Needs: No Transportation Needs (04/06/2024)   PRAPARE - Administrator, Civil Service (Medical): No    Lack of Transportation (Non-Medical): No  Physical Activity: Not on file  Stress: Not on file  Social Connections: Not on file  Intimate Partner Violence: Not At Risk (04/06/2024)   Humiliation, Afraid, Rape, and Kick questionnaire    Fear of Current or Ex-Partner: No    Emotionally Abused: No    Physically Abused: No    Sexually Abused: No    PHYSICAL EXAM  GENERAL EXAM/CONSTITUTIONAL: Vitals:  Vitals:   05/10/24 1413  BP: 120/85  Pulse: 79  SpO2: 98%  Weight: 212 lb (96.2 kg)  Height: 5' 2 (1.575 m)   Body mass index is 38.78 kg/m. Wt Readings from Last 3 Encounters:  05/10/24 212 lb (96.2 kg)  04/06/24 214 lb 12.8 oz (97.4 kg)  02/29/24 210 lb (95.3 kg)   Patient is in no distress; well developed, nourished and groomed; neck is supple  MUSCULOSKELETAL: Gait, strength, tone, movements noted in Neurologic exam below  NEUROLOGIC: MENTAL STATUS:     05/10/2024    2:17 PM  MMSE - Mini Mental State Exam  Orientation to time 5  Orientation to Place 5  Registration 3  Attention/ Calculation 2  Recall 3  Language- name 2 objects 2  Language- repeat 1  Language- follow 3 step command 3  Language- read & follow direction 1   Write a sentence 1  Copy design 0  Total score 26   awake, alert, oriented to person, place and time recent and remote memory intact normal attention and concentration language fluent, comprehension intact, naming intact fund of knowledge appropriate  CRANIAL NERVE:  2nd, 3rd, 4th, 6th- visual fields full to confrontation, extraocular muscles intact, no nystagmus 5th - facial sensation symmetric 7th - facial strength symmetric 8th - hearing intact 9th - palate elevates symmetrically, uvula midline 11th - shoulder shrug symmetric 12th - tongue protrusion midline  MOTOR:  normal bulk and tone, full strength in the BUE, BLE  SENSORY:  normal and symmetric to light touch  COORDINATION:  finger-nose-finger, fine finger movements normal  GAIT/STATION:  normal   DIAGNOSTIC DATA (LABS, IMAGING, TESTING) - I reviewed patient records, labs, notes, testing and imaging myself where available.  Lab Results  Component Value Date   WBC 7.8 04/06/2024   HGB 12.0 04/06/2024   HCT 37.4 04/06/2024   MCV 92.1 04/06/2024   PLT 203 04/06/2024      Component Value Date/Time   NA 137 04/06/2024 1252   K 4.6 04/06/2024 1252   CL 105 04/06/2024 1252   CO2 26 04/06/2024 1252   GLUCOSE 142 (H) 04/06/2024 1252   BUN 12 04/06/2024 1252   CREATININE 0.97 04/06/2024 1252   CALCIUM 9.2 04/06/2024 1252   PROT 8.0 04/06/2024 1252   ALBUMIN 3.9 04/06/2024 1252   AST 15 04/06/2024 1252   ALT 12 04/06/2024 1252   ALKPHOS 60 04/06/2024 1252   BILITOT 0.6 04/06/2024 1252   GFRNONAA >60 04/06/2024 1252   GFRAA 54 (L) 08/14/2019 1315   No results found for: CHOL, HDL, LDLCALC, LDLDIRECT, TRIG, CHOLHDL No results found for: YHAJ8R No results found for:  VITAMINB12 No results found for: TSH     ASSESSMENT AND PLAN  78 y.o. year old female with    Sleep disturbance Intermittent episodes of insomnia over the past few days, lasting up to three days, with no significant  impact on daily activities or memory. Normal neurological exam and test scores. Possible relation to lifestyle factors such as watching TV late at night. - Advise to return if there are concerns about memory in the future.    1. Subjective memory complaints     Patient Instructions  Continue current medications  Continue to follow up with PCP  Return as needed   No orders of the defined types were placed in this encounter.   No orders of the defined types were placed in this encounter.   Return if symptoms worsen or fail to improve.    Pastor Falling, MD 05/10/2024, 2:38 PM  [facility] [address] [place], [place] [address] [phone]

## 2024-05-10 NOTE — Patient Instructions (Signed)
 Continue current medications  Continue to follow up with PCP  Return as needed

## 2024-05-22 ENCOUNTER — Encounter: Payer: Self-pay | Admitting: *Deleted

## 2024-05-22 NOTE — Progress Notes (Signed)
 Lisa Horton                                          MRN: [id number]   05/22/2024   The [facility] Quality Team Specialist reviewed this patient medical record for the purposes of chart review for care gap closure. The following were reviewed: chart review for care gap closure-kidney health evaluation for diabetes:eGFR  and uACR.    [facility] Quality Team

## 2024-07-13 ENCOUNTER — Ambulatory Visit (HOSPITAL_BASED_OUTPATIENT_CLINIC_OR_DEPARTMENT_OTHER)

## 2024-07-13 VITALS — BP 112/65 | HR 93

## 2024-07-13 DIAGNOSIS — R3915 Urgency of urination: Secondary | ICD-10-CM

## 2024-07-13 LAB — POCT URINALYSIS DIP (CLINITEK)
Bilirubin, UA: NEGATIVE
Glucose, UA: NEGATIVE mg/dL
Ketones, POC UA: NEGATIVE mg/dL
Nitrite, UA: NEGATIVE
POC PROTEIN,UA: NEGATIVE
Spec Grav, UA: 1.02 (ref 1.010–1.025)
Urobilinogen, UA: 0.2 U/dL
pH, UA: 6 (ref 5.0–8.0)

## 2024-07-13 MED ORDER — NITROFURANTOIN MONOHYD MACRO 100 MG PO CAPS: 100.0000 mg | ORAL_CAPSULE | Freq: Two times a day (BID) | ORAL | 0 refills | Status: DC

## 2024-07-13 NOTE — Progress Notes (Signed)
 NURSE VISIT- UTI SYMPTOMS   SUBJECTIVE:  Lisa Horton is a 78 y.o. G13P2002 female here for UTI symptoms. She is a GYN patient. She reports dysuria, urinary hesitancy, urinary incontinence, and urinary retention.Urinary urgency as well. She reports symptoms have continued for three weeks.   OBJECTIVE:  LMP 08/03/1984   Appears well, in no apparent distress  No results found for this or any previous visit (from the past 24 hours).  ASSESSMENT: GYN patient with UTI symptoms and negative nitrites  PLAN: Visit routed to or discussed with:  Rx sent today: Yes Urine culture sent Call or return to clinic prn if these symptoms worsen or fail to improve as anticipated. Follow-up: as needed   Morna LOISE Quale, RN

## 2024-07-14 LAB — URINE CULTURE

## 2024-07-16 ENCOUNTER — Ambulatory Visit: Payer: Self-pay | Admitting: Obstetrics and Gynecology

## 2024-07-26 ENCOUNTER — Ambulatory Visit: Admitting: Podiatry

## 2024-07-26 DIAGNOSIS — M65972 Unspecified synovitis and tenosynovitis, left ankle and foot: Secondary | ICD-10-CM

## 2024-07-26 DIAGNOSIS — Q666 Other congenital valgus deformities of feet: Secondary | ICD-10-CM

## 2024-07-26 DIAGNOSIS — M65971 Unspecified synovitis and tenosynovitis, right ankle and foot: Secondary | ICD-10-CM | POA: Diagnosis not present

## 2024-07-26 NOTE — Progress Notes (Signed)
 "  Subjective:  Patient ID: Lisa Horton, female    DOB: June 08, 1946,  MRN: [id number]  Chief Complaint  Patient presents with   Foot Pain    Patient presents today c/o B/L foot pain    78 y.o. female presents with the above complaint.  Patient presents with bilateral ankle pain that has been for quite some time is progressive gotten worse worse with ambulation worse with pressure he would like to discuss treatment options for it.  She has not seen a neurologist prior to seeing me she says it causing her some swelling.  Spring for few months has progressively worse.  She does not wear any orthotics.    Review of Systems: Negative except as noted in the HPI. Denies N/V/F/Ch.  Past Medical History:  Diagnosis Date   Acid reflux    Allergic rhinitis    Arthritis    Asthma    Bruises easily    Cancer (HCC)    rt breast cancer   Chicken pox    Colon polyp    Family history of breast cancer    Family history of pancreatic cancer    Family history of prostate cancer    Gum disease    Hypertension    Hypothyroidism    Incontinence    Measles    Myopathy    arms and legs, statin drug related   Prediabetes 06/2014   Current Medications[1]  Tobacco Use History[2]  Allergies[3] Objective:  There were no vitals filed for this visit. There is no height or weight on file to calculate BMI. Constitutional Well developed. Well nourished.  Vascular Dorsalis pedis pulses palpable bilaterally. Posterior tibial pulses palpable bilaterally. Capillary refill normal to all digits.  No cyanosis or clubbing noted. Pedal hair growth normal.  Neurologic Normal speech. Oriented to person, place, and time. Epicritic sensation to light touch grossly present bilaterally.  Dermatologic Nails well groomed and normal in appearance. No open wounds. No skin lesions.  Orthopedic: Pain on palpation to bilateral ankle joint medial gutter.  No pain on the lateral side.  No pain with range of motion  of the ankle joint no deep intra-articular ankle pain noted no pain at the Achilles peroneal or posterior tibial tendon.  Pes planovalgus foot structure   Radiographs: None Assessment:   1. Synovitis of right ankle   2. Synovitis of left ankle   3. Pes planovalgus    Plan:  Patient was evaluated and treated and all questions answered.  Bilateral ankle synovitis - All questions and concerns were discussed with the patient extensive detail given the amount of pain that she is experiencing should benefit from steroid injection help decrease inflammatory commercials for pain.  Patient agrees to plan to proceed with steroid injection -A steroid injection was performed at Bilateral ankle joint using 1% plain Lidocaine  and 10 mg of Kenalog . This was well tolerated.   Pes planovalgus/foot deformity -I explained to patient the etiology of pes planovalgus and relationship with heel pain/arch pain and various treatment options were discussed.  Given patient foot structure in the setting of heel pain/arch pain I believe patient will benefit from custom-made orthotics to help control the hindfoot motion support the arch of the foot and take the stress away from arches.  Patient agrees with the plan like to proceed with orthotics -Patient was casted for orthotics   No follow-ups on file.      [1]  Current Outpatient Medications:    betamethasone  valerate ointment (VALISONE )  0.1 %, Apply 1 Application topically 2 (two) times daily. Use for up to 2 weeks, Disp: 30 g, Rfl: 0   cetirizine (ZYRTEC) 10 MG tablet, Take 10 mg by mouth daily., Disp: , Rfl:    irbesartan (AVAPRO) 300 MG tablet, Take 300 mg by mouth daily. , Disp: , Rfl:    levothyroxine (SYNTHROID) 125 MCG tablet, Take 125 mcg by mouth daily., Disp: , Rfl:    nitrofurantoin , macrocrystal-monohydrate, (MACROBID ) 100 MG capsule, Take 1 capsule (100 mg total) by mouth 2 (two) times daily., Disp: 14 capsule, Rfl: 0   spironolactone (ALDACTONE)  50 MG tablet, Take 50 mg by mouth daily. Taking 1/2 pill daily, Disp: , Rfl:    Vitamin D, Ergocalciferol, (DRISDOL) 50000 units CAPS capsule, Take 1 capsule by mouth once a week., Disp: , Rfl: 3 [2]  Social History Tobacco Use  Smoking Status Former   Current packs/day: 0.00   Average packs/day: 1 pack/day for 48.0 years (48.0 ttl pk-yrs)   Types: Cigarettes   Start date: 08/03/1960   Quit date: 08/03/2008   Years since quitting: 16.0  Smokeless Tobacco Never  [3]  Allergies Allergen Reactions   Statins Other (See Comments)    Medication Crestor:   Urine became very dark within two days.   Nerve damage and myopathy as a result.    Adhesive [Tape]    Epinephrine Other (See Comments)   Hydrogen Peroxide Other (See Comments)    Blisters    Lidocaine  Other (See Comments)    Combination of Lidocaine  and Epinephrine during dental procedure caused her to nearly arrest.    Metrogel [Metronidazole] Other (See Comments)    Increase in vaginal burning.  Does better with Flagyl   Tamoxifen  Other (See Comments)   Latex Rash   Neomycin-Bacitracin Zn-Polymyx Rash   Sulfa Antibiotics Rash   "

## 2024-08-18 ENCOUNTER — Telehealth: Payer: Self-pay | Admitting: Podiatry

## 2024-08-18 NOTE — Telephone Encounter (Signed)
 Orthotics are in [place] called patient left message on voicemail for pt to call and schedule for pick up

## 2024-09-05 ENCOUNTER — Observation Stay (HOSPITAL_COMMUNITY)
Admission: EM | Admit: 2024-09-05 | Discharge: 2024-09-06 | Disposition: A | Source: Home / Self Care | Attending: Emergency Medicine | Admitting: Emergency Medicine

## 2024-09-05 ENCOUNTER — Encounter (HOSPITAL_COMMUNITY): Payer: Self-pay

## 2024-09-05 ENCOUNTER — Emergency Department (HOSPITAL_COMMUNITY)

## 2024-09-05 ENCOUNTER — Other Ambulatory Visit: Payer: Self-pay

## 2024-09-05 DIAGNOSIS — I10 Essential (primary) hypertension: Secondary | ICD-10-CM | POA: Diagnosis present

## 2024-09-05 DIAGNOSIS — G459 Transient cerebral ischemic attack, unspecified: Secondary | ICD-10-CM | POA: Diagnosis present

## 2024-09-05 DIAGNOSIS — R4182 Altered mental status, unspecified: Principal | ICD-10-CM

## 2024-09-05 DIAGNOSIS — R4701 Aphasia: Secondary | ICD-10-CM

## 2024-09-05 DIAGNOSIS — E119 Type 2 diabetes mellitus without complications: Secondary | ICD-10-CM

## 2024-09-05 DIAGNOSIS — E7800 Pure hypercholesterolemia, unspecified: Secondary | ICD-10-CM | POA: Diagnosis present

## 2024-09-05 DIAGNOSIS — E039 Hypothyroidism, unspecified: Secondary | ICD-10-CM | POA: Diagnosis present

## 2024-09-05 LAB — URINE DRUG SCREEN
Amphetamines: NEGATIVE
Barbiturates: NEGATIVE
Benzodiazepines: NEGATIVE
Cocaine: NEGATIVE
Fentanyl: NEGATIVE
Methadone Scn, Ur: NEGATIVE
Opiates: NEGATIVE
Tetrahydrocannabinol: NEGATIVE

## 2024-09-05 LAB — PROTIME-INR
INR: 1.2 (ref 0.8–1.2)
Prothrombin Time: 15.6 s — ABNORMAL HIGH (ref 11.4–15.2)

## 2024-09-05 LAB — APTT: aPTT: 28 s (ref 24–36)

## 2024-09-05 LAB — COMPREHENSIVE METABOLIC PANEL WITH GFR
ALT: 16 U/L (ref 0–44)
AST: 19 U/L (ref 15–41)
Albumin: 4 g/dL (ref 3.5–5.0)
Alkaline Phosphatase: 76 U/L (ref 38–126)
Anion gap: 7 (ref 5–15)
BUN: 12 mg/dL (ref 8–23)
CO2: 29 mmol/L (ref 22–32)
Calcium: 9 mg/dL (ref 8.9–10.3)
Chloride: 103 mmol/L (ref 98–111)
Creatinine, Ser: 1 mg/dL (ref 0.44–1.00)
GFR, Estimated: 58 mL/min — ABNORMAL LOW
Glucose, Bld: 142 mg/dL — ABNORMAL HIGH (ref 70–99)
Potassium: 3.9 mmol/L (ref 3.5–5.1)
Sodium: 139 mmol/L (ref 135–145)
Total Bilirubin: 0.8 mg/dL (ref 0.0–1.2)
Total Protein: 7.5 g/dL (ref 6.5–8.1)

## 2024-09-05 LAB — CBC WITH DIFFERENTIAL/PLATELET
Abs Immature Granulocytes: 0.05 10*3/uL (ref 0.00–0.07)
Basophils Absolute: 0 10*3/uL (ref 0.0–0.1)
Basophils Relative: 1 %
Eosinophils Absolute: 0.1 10*3/uL (ref 0.0–0.5)
Eosinophils Relative: 2 %
HCT: 39.3 % (ref 36.0–46.0)
Hemoglobin: 12.5 g/dL (ref 12.0–15.0)
Immature Granulocytes: 1 %
Lymphocytes Relative: 23 %
Lymphs Abs: 2 10*3/uL (ref 0.7–4.0)
MCH: 30.3 pg (ref 26.0–34.0)
MCHC: 31.8 g/dL (ref 30.0–36.0)
MCV: 95.4 fL (ref 80.0–100.0)
Monocytes Absolute: 0.5 10*3/uL (ref 0.1–1.0)
Monocytes Relative: 6 %
Neutro Abs: 5.9 10*3/uL (ref 1.7–7.7)
Neutrophils Relative %: 67 %
Platelets: 234 10*3/uL (ref 150–400)
RBC: 4.12 MIL/uL (ref 3.87–5.11)
RDW: 13.9 % (ref 11.5–15.5)
WBC: 8.7 10*3/uL (ref 4.0–10.5)
nRBC: 0 % (ref 0.0–0.2)

## 2024-09-05 LAB — I-STAT CHEM 8, ED
BUN: 12 mg/dL (ref 8–23)
Calcium, Ion: 1.14 mmol/L — ABNORMAL LOW (ref 1.15–1.40)
Chloride: 104 mmol/L (ref 98–111)
Creatinine, Ser: 1 mg/dL (ref 0.44–1.00)
Glucose, Bld: 135 mg/dL — ABNORMAL HIGH (ref 70–99)
HCT: 36 % (ref 36.0–46.0)
Hemoglobin: 12.2 g/dL (ref 12.0–15.0)
Potassium: 4.2 mmol/L (ref 3.5–5.1)
Sodium: 141 mmol/L (ref 135–145)
TCO2: 27 mmol/L (ref 22–32)

## 2024-09-05 LAB — ETHANOL: Alcohol, Ethyl (B): <15 mg/dL

## 2024-09-05 LAB — MAGNESIUM: Magnesium: 2.1 mg/dL (ref 1.7–2.4)

## 2024-09-05 MED ORDER — IOHEXOL 350 MG/ML SOLN
100.0000 mL | Freq: Once | INTRAVENOUS | Status: AC | PRN
  Administered 2024-09-05: 100 mL via INTRAVENOUS

## 2024-09-05 NOTE — ED Provider Notes (Signed)
 [name]

## 2024-09-05 NOTE — ED Notes (Signed)
Pt provided a coke  

## 2024-09-05 NOTE — ED Notes (Signed)
 Called and placed PT on monitor with CCMD

## 2024-09-05 NOTE — ED Notes (Signed)
 Pt back from MRI

## 2024-09-05 NOTE — ED Notes (Addendum)
 Patient transported to MRI

## 2024-09-05 NOTE — ED Notes (Signed)
 Patient transported to MRI

## 2024-09-06 ENCOUNTER — Observation Stay (HOSPITAL_COMMUNITY)

## 2024-09-06 ENCOUNTER — Encounter (HOSPITAL_COMMUNITY): Payer: Self-pay | Admitting: Internal Medicine

## 2024-09-06 DIAGNOSIS — G459 Transient cerebral ischemic attack, unspecified: Secondary | ICD-10-CM | POA: Diagnosis present

## 2024-09-06 DIAGNOSIS — I10 Essential (primary) hypertension: Secondary | ICD-10-CM

## 2024-09-06 DIAGNOSIS — E039 Hypothyroidism, unspecified: Secondary | ICD-10-CM

## 2024-09-06 LAB — CBC
HCT: 41.7 % (ref 36.0–46.0)
HCT: 38.4 % (ref 36.0–46.0)
Hemoglobin: 13.6 g/dL (ref 12.0–15.0)
Hemoglobin: 12.2 g/dL (ref 12.0–15.0)
MCH: 30.4 pg (ref 26.0–34.0)
MCH: 30.2 pg (ref 26.0–34.0)
MCHC: 32.6 g/dL (ref 30.0–36.0)
MCHC: 31.8 g/dL (ref 30.0–36.0)
MCV: 93.1 fL (ref 80.0–100.0)
MCV: 95 fL (ref 80.0–100.0)
Platelets: 256 10*3/uL (ref 150–400)
Platelets: 219 10*3/uL (ref 150–400)
RBC: 4.48 MIL/uL (ref 3.87–5.11)
RBC: 4.04 MIL/uL (ref 3.87–5.11)
RDW: 13.8 % (ref 11.5–15.5)
RDW: 13.8 % (ref 11.5–15.5)
WBC: 11 10*3/uL — ABNORMAL HIGH (ref 4.0–10.5)
WBC: 10.5 10*3/uL (ref 4.0–10.5)
nRBC: 0 % (ref 0.0–0.2)
nRBC: 0 % (ref 0.0–0.2)

## 2024-09-06 LAB — COMPREHENSIVE METABOLIC PANEL WITH GFR
ALT: 15 U/L (ref 0–44)
AST: 18 U/L (ref 15–41)
Albumin: 3.8 g/dL (ref 3.5–5.0)
Alkaline Phosphatase: 62 U/L (ref 38–126)
Anion gap: 9 (ref 5–15)
BUN: 14 mg/dL (ref 8–23)
CO2: 25 mmol/L (ref 22–32)
Calcium: 8.8 mg/dL — ABNORMAL LOW (ref 8.9–10.3)
Chloride: 104 mmol/L (ref 98–111)
Creatinine, Ser: 1 mg/dL (ref 0.44–1.00)
GFR, Estimated: 58 mL/min — ABNORMAL LOW
Glucose, Bld: 121 mg/dL — ABNORMAL HIGH (ref 70–99)
Potassium: 3.9 mmol/L (ref 3.5–5.1)
Sodium: 139 mmol/L (ref 135–145)
Total Bilirubin: 0.8 mg/dL (ref 0.0–1.2)
Total Protein: 6.9 g/dL (ref 6.5–8.1)

## 2024-09-06 LAB — CBG MONITORING, ED
Glucose-Capillary: 179 mg/dL — ABNORMAL HIGH (ref 70–99)
Glucose-Capillary: 110 mg/dL — ABNORMAL HIGH (ref 70–99)

## 2024-09-06 LAB — ECHOCARDIOGRAM COMPLETE
AR max vel: 2.41 cm2
AV Area VTI: 2.57 cm2
AV Area mean vel: 2.47 cm2
AV Mean grad: 3 mmHg
AV Peak grad: 5.9 mmHg
Ao pk vel: 1.21 m/s
Area-P 1/2: 3.5 cm2
Calc EF: 56.9 %
Height: 62 in
MV VTI: 3.16 cm2
S' Lateral: 2.9 cm
Single Plane A2C EF: 55.1 %
Single Plane A4C EF: 60.6 %
Weight: 3393.32 [oz_av]

## 2024-09-06 LAB — LIPID PANEL
Cholesterol: 194 mg/dL (ref 0–200)
HDL: 58 mg/dL
LDL Cholesterol: 111 mg/dL — ABNORMAL HIGH (ref 0–99)
Total CHOL/HDL Ratio: 3.4 ratio
Triglycerides: 129 mg/dL
VLDL: 26 mg/dL (ref 0–40)

## 2024-09-06 LAB — HEMOGLOBIN A1C
Hgb A1c MFr Bld: 8 % — ABNORMAL HIGH (ref 4.8–5.6)
Mean Plasma Glucose: 182.9 mg/dL

## 2024-09-06 LAB — CREATININE, SERUM
Creatinine, Ser: 1.07 mg/dL — ABNORMAL HIGH (ref 0.44–1.00)
GFR, Estimated: 54 mL/min — ABNORMAL LOW

## 2024-09-06 LAB — SEDIMENTATION RATE: Sed Rate: 26 mm/h — ABNORMAL HIGH (ref 0–22)

## 2024-09-06 LAB — C-REACTIVE PROTEIN: CRP: <0.5 mg/dL

## 2024-09-06 MED ORDER — ACETAMINOPHEN 160 MG/5ML PO SOLN: 650.0000 mg | ORAL | Status: DC | PRN

## 2024-09-06 MED ORDER — ENOXAPARIN SODIUM 40 MG/0.4ML IJ SOSY
40.0000 mg | PREFILLED_SYRINGE | INTRAMUSCULAR | Status: DC
  Administered 2024-09-06: 40 mg via SUBCUTANEOUS
  Filled 2024-09-06: qty 0.4

## 2024-09-06 MED ORDER — INSULIN ASPART 100 UNIT/ML IJ SOLN: 0.0000 [IU] | Freq: Every day | INTRAMUSCULAR | Status: DC

## 2024-09-06 MED ORDER — EZETIMIBE 10 MG PO TABS
10.0000 mg | ORAL_TABLET | Freq: Every day | ORAL | Status: DC
  Administered 2024-09-06: 10 mg via ORAL
  Filled 2024-09-06: qty 1

## 2024-09-06 MED ORDER — LEVOTHYROXINE SODIUM 25 MCG PO TABS
125.0000 ug | ORAL_TABLET | Freq: Every day | ORAL | Status: DC
  Administered 2024-09-06: 125 ug via ORAL
  Filled 2024-09-06: qty 1

## 2024-09-06 MED ORDER — STROKE: EARLY STAGES OF RECOVERY BOOK: Freq: Once | Status: DC

## 2024-09-06 MED ORDER — ACETAMINOPHEN 650 MG RE SUPP: 650.0000 mg | RECTAL | Status: DC | PRN

## 2024-09-06 MED ORDER — CLOPIDOGREL BISULFATE 75 MG PO TABS
75.0000 mg | ORAL_TABLET | Freq: Every day | ORAL | Status: DC
  Administered 2024-09-06: 75 mg via ORAL
  Filled 2024-09-06: qty 1

## 2024-09-06 MED ORDER — INSULIN ASPART 100 UNIT/ML IJ SOLN
0.0000 [IU] | Freq: Three times a day (TID) | INTRAMUSCULAR | Status: DC
  Administered 2024-09-06: 2 [IU] via SUBCUTANEOUS
  Filled 2024-09-06: qty 2

## 2024-09-06 MED ORDER — ACETAMINOPHEN 325 MG PO TABS: 650.0000 mg | ORAL_TABLET | ORAL | Status: DC | PRN

## 2024-09-06 MED ORDER — EZETIMIBE 10 MG PO TABS: 10.0000 mg | ORAL_TABLET | Freq: Every day | ORAL | 0 refills | Status: AC

## 2024-09-06 MED ORDER — METFORMIN HCL 500 MG PO TABS: 500.0000 mg | ORAL_TABLET | Freq: Two times a day (BID) | ORAL | 0 refills | Status: AC

## 2024-09-06 MED ORDER — ASPIRIN 81 MG PO TBEC
81.0000 mg | DELAYED_RELEASE_TABLET | Freq: Every day | ORAL | Status: DC
  Administered 2024-09-06: 81 mg via ORAL
  Filled 2024-09-06: qty 1

## 2024-09-06 MED ORDER — LIVING WELL WITH DIABETES BOOK
Freq: Once | Status: AC
  Filled 2024-09-06: qty 1

## 2024-09-06 MED ORDER — SODIUM CHLORIDE 0.9 % IV SOLN: INTRAVENOUS | Status: DC

## 2024-09-06 MED ORDER — CLOPIDOGREL BISULFATE 75 MG PO TABS: 75.0000 mg | ORAL_TABLET | Freq: Every day | ORAL | 0 refills | Status: AC

## 2024-09-06 MED ORDER — ASPIRIN 81 MG PO TBEC: 81.0000 mg | DELAYED_RELEASE_TABLET | Freq: Every day | ORAL | 12 refills | Status: AC

## 2024-09-06 MED ORDER — HYDRALAZINE HCL 20 MG/ML IJ SOLN: 10.0000 mg | INTRAMUSCULAR | Status: DC | PRN

## 2024-09-06 NOTE — H&P (Addendum)
 " History and Physical    Lisa Horton FMW:[id number] DOB: 08-29-1945 DOA: 09/05/2024  Patient coming from: Home.  Chief Complaint: Blurred vision.  HPI: Lisa Horton is a 79 y.o. female with history of hypertension, hypothyroidism, breast cancer in remission was brought to the ER after patient's daughter noticed that patient was complaining of left sided blurred vision and also had some difficulty talking at home and felt that patient also was confused.  The whole episode lasted for around 15 minutes following which it resolved.  Denies any weakness of the upper or lower extremities.  ED Course: In the ER MRI of the brain was negative for anything acute.  CT angiogram of the head did not show any large vessel obstruction.  Labs are largely unremarkable.  Neurology on-call was consulted patient admitted for further TIA workup.  Review of Systems: As per HPI, rest all negative.   Past Medical History:  Diagnosis Date   Acid reflux    Allergic rhinitis    Arthritis    Asthma    Bruises easily    Cancer (HCC)    rt breast cancer   Chicken pox    Colon polyp    Family history of breast cancer    Family history of pancreatic cancer    Family history of prostate cancer    Gum disease    Hypertension    Hypothyroidism    Incontinence    Measles    Myopathy    arms and legs, statin drug related   Prediabetes 06/2014    Past Surgical History:  Procedure Laterality Date   ABDOMINAL HYSTERECTOMY  1986   secondary to fibroids   BREAST LUMPECTOMY WITH RADIOACTIVE SEED AND SENTINEL LYMPH NODE BIOPSY Right 03/09/2018   Procedure: RIGHT BREAST LUMPECTOMY WITH BRACKETED RADIOACTIVE SEED AND SENTINEL LYMPH NODE BIOPSY;  Surgeon: Ebbie Cough, MD;  Location: [facility];  Service: General;  Laterality: Right;   BREAST SURGERY  07/09/2008   mass removal   CHOLECYSTECTOMY  03/17/11   RE-EXCISION OF BREAST LUMPECTOMY Right 03/31/2018   Procedure: RE-EXCISION OF BREAST  LUMPECTOMY;  Surgeon: Ebbie Cough, MD;  Location: [facility];  Service: General;  Laterality: Right;   SKIN TAG REMOVAL     brow and lid   THIGH / KNEE SOFT TISSUE BIOPSY  09/05/2009   TUBAL LIGATION  1980     reports that she quit smoking about 16 years ago. Her smoking use included cigarettes. She started smoking about 64 years ago. She has a 48 pack-year smoking history. She has never used smokeless tobacco. She reports that she does not currently use alcohol. She reports that she does not use drugs.  Allergies[1]  Family History  Problem Relation Age of Onset   Hypertension Mother    Pancreatic cancer Mother 43   Hypertension Father    Prostate cancer Father 83   Emphysema Father    Hypertension Sister    Breast cancer Sister        dx >50, caught very early   Hypertension Brother    Alzheimer's disease Brother    Hypertension Sister    Heart failure Brother    Hypertension Brother    Breast cancer Daughter 24       BRCA1/2 negative, never had panel testing   Allergies Sister    Allergies Brother    Throat cancer Maternal Uncle     Prior to Admission medications  Medication Sig Start Date End Date  Taking? Authorizing Provider  betamethasone  valerate ointment (VALISONE ) 0.1 % Apply 1 Application topically 2 (two) times daily. Use for up to 2 weeks 02/26/22  Yes Cleotilde Ronal RAMAN, MD  cetirizine (ZYRTEC) 10 MG tablet Take 10 mg by mouth daily.   Yes [provider]  irbesartan (AVAPRO) 300 MG tablet Take 300 mg by mouth daily.  10/28/13  Yes [provider]  levothyroxine  (SYNTHROID ) 125 MCG tablet Take 125 mcg by mouth daily. 05/02/20  Yes [provider]  Vitamin D, Ergocalciferol, (DRISDOL) 50000 units CAPS capsule Take 1 capsule by mouth once a week. 12/02/17  Yes [provider]  nitrofurantoin , macrocrystal-monohydrate, (MACROBID ) 100 MG capsule Take 1 capsule (100 mg total) by mouth 2 (two) times daily. Patient not  taking: Reported on 09/06/2024 07/13/24   Delores Nidia CROME, FNP  spironolactone (ALDACTONE) 25 MG tablet Take 25 mg by mouth daily. Taking 1/2 pill daily    [provider]    Physical Exam: Constitutional: Moderately built and nourished. Vitals:   09/05/24 2215 09/05/24 2230 09/05/24 2330 09/06/24 0211  BP: (!) 147/84 (!) 156/80 137/80   Pulse: 65 83 75   Resp: (!) 22 20 20    Temp:    98.9 F (37.2 C)  TempSrc:    Oral  SpO2: 100% 100% 100%   Weight:      Height:       Eyes: Anicteric no pallor. ENMT: No discharge from the ears eyes nose or mouth. Neck: No mass felt.  No neck rigidity. Respiratory: No rhonchi or crepitations. Cardiovascular: S1-S2 heard. Abdomen: Soft nontender bowel sound present. Musculoskeletal: No edema. Skin: No rash. Neurologic: Alert awake oriented to time place and person.  Moves all extremities 5 x 5.  No facial asymmetry tongue is midline pupils are equal and reacting to light. Psychiatric: Appears normal.  Normal affect.   Labs on Admission: I have personally reviewed following labs and imaging studies  CBC: Recent Labs  Lab 09/05/24 1554 09/05/24 1715  WBC 8.7  --   NEUTROABS 5.9  --   HGB 12.5 12.2  HCT 39.3 36.0  MCV 95.4  --   PLT 234  --    Basic Metabolic Panel: Recent Labs  Lab 09/05/24 1554 09/05/24 1715  NA 139 141  K 3.9 4.2  CL 103 104  CO2 29  --   GLUCOSE 142* 135*  BUN 12 12  CREATININE 1.00 1.00  CALCIUM 9.0  --   MG 2.1  --    GFR: Estimated Creatinine Clearance: 51.8 mL/min (by C-G formula based on SCr of 1 mg/dL). Liver Function Tests: Recent Labs  Lab 09/05/24 1554  AST 19  ALT 16  ALKPHOS 76  BILITOT 0.8  PROT 7.5  ALBUMIN 4.0   No results for input(s): LIPASE, AMYLASE in the last 168 hours. No results for input(s): AMMONIA in the last 168 hours. Coagulation Profile: Recent Labs  Lab 09/05/24 1635  INR 1.2   Cardiac Enzymes: No results for input(s): CKTOTAL, CKMB,  CKMBINDEX, TROPONINI in the last 168 hours. BNP (last 3 results) No results for input(s): PROBNP in the last 8760 hours. HbA1C: No results for input(s): HGBA1C in the last 72 hours. CBG: No results for input(s): GLUCAP in the last 168 hours. Lipid Profile: No results for input(s): CHOL, HDL, LDLCALC, TRIG, CHOLHDL, LDLDIRECT in the last 72 hours. Thyroid  Function Tests: No results for input(s): TSH, T4TOTAL, FREET4, T3FREE, THYROIDAB in the last 72 hours. Anemia Panel: No results  for input(s): VITAMINB12, FOLATE, FERRITIN, TIBC, IRON, RETICCTPCT in the last 72 hours. Urine analysis:    Component Value Date/Time   BILIRUBINUR negative 07/13/2024 1511   BILIRUBINUR Negative 12/23/2022 1628   KETONESUR negative 07/13/2024 1511   PROTEINUR Negative 12/23/2022 1628   UROBILINOGEN 0.2 07/13/2024 1511   NITRITE Negative 07/13/2024 1511   NITRITE Negative 12/23/2022 1628   LEUKOCYTESUR Small (1+) (A) 07/13/2024 1511   Sepsis Labs: @LABRCNTIP (procalcitonin:4,lacticidven:4) )No results found for this or any previous visit (from the past 240 hours).   Radiological Exams on Admission: MR BRAIN WO CONTRAST Result Date: 09/05/2024 EXAM: MRI BRAIN WITHOUT CONTRAST 09/05/2024 07:51:39 PM TECHNIQUE: Multiplanar multisequence MRI of the head/brain was performed without the administration of intravenous contrast. COMPARISON: None available. CLINICAL HISTORY: Transient ischemic attack (TIA); ?TIA, left sided vision change, aphasia. Query transient ischemic attack (TIA), left-sided vision change, aphasia. FINDINGS: BRAIN AND VENTRICLES: No acute infarct. No intracranial hemorrhage. No mass. No midline shift. No hydrocephalus. Multifocal hyperintense T2-weighted signal within the cerebral white matter, most commonly due to chronic small vessel disease. The sella is unremarkable. Normal flow voids. ORBITS: No significant abnormality. SINUSES AND MASTOIDS: No  significant abnormality. BONES AND SOFT TISSUES: Normal marrow signal. No soft tissue abnormality. IMPRESSION: 1. No acute infarct or other acute intracranial abnormality. 2. Multifocal T2 hyperintense signal in the cerebral white matter, most consistent with chronic small vessel disease. Electronically signed by: Franky Stanford MD 09/05/2024 08:01 PM EST RP Workstation: [id number]   DG Chest Portable 1 View Result Date: 09/05/2024 CLINICAL DATA:  Altered mental status. EXAM: PORTABLE CHEST 1 VIEW COMPARISON:  August 21, 2017 FINDINGS: The heart size and mediastinal contours are within normal limits. Mild bilateral peribronchial cuffing is noted. Mild atelectatic changes are suspected within the left lung base. No pleural effusion or pneumothorax is identified. Radiopaque surgical clips are seen overlying the right axilla. Multilevel degenerative changes are present throughout the thoracic spine. IMPRESSION: Mild left basilar atelectasis. Electronically Signed   By: Suzen Dials M.D.   On: 09/05/2024 18:05   CT ANGIO HEAD NECK W WO CM Result Date: 09/05/2024 EXAM: CTA HEAD AND NECK WITH AND WITHOUT 09/05/2024 05:07:15 PM TECHNIQUE: CTA of the head and neck was performed with and without the administration of 100 mL of iohexol  (OMNIPAQUE ) 350 MG/ML intravenous contrast. Multiplanar 2D and/or 3D reformatted images are provided for review. Automated exposure control, iterative reconstruction, and/or weight based adjustment of the mA/kV was utilized to reduce the radiation dose to as low as reasonably achievable. Stenosis of the internal carotid arteries measured using NASCET criteria. COMPARISON: CT head dated 09/05/2024 CLINICAL HISTORY: Transient ischemic attack (TIA). FINDINGS: CTA NECK: AORTIC ARCH AND ARCH VESSELS: Mild atherosclerosis of the aortic arch. No dissection or arterial injury. No significant stenosis of the brachiocephalic or subclavian arteries. Mild atherosclerosis of the proximal right  subclavian artery without significant stenosis. CERVICAL CAROTID ARTERIES: No hemodynamically significant stenosis of the right cervical ICA. Atherosclerosis at the left carotid bifurcation without hemodynamically significant stenosis. No dissection or arterial injury. CERVICAL VERTEBRAL ARTERIES: No dissection, arterial injury, or significant stenosis. LUNGS AND MEDIASTINUM: Mild prominence of the main pulmonary artery measuring up to 3.2 cm in diameter which could reflect pulmonary arterial hypertension. There is mild bronchial wall thickening in the upper lobes with a few areas of opacity in the medial aspects of both upper lobes which could reflect viral infection versus mild pulmonary edema. SOFT TISSUES: Surgical clips in the upper outer quadrant of the right breast with  partially visualized irregular soft tissue in correlation with history of mammogram and prior biopsy. There are also likely changes of prior right axillary lymph node biopsy. BONES: Degenerative changes in the visualized spine. Disc space narrowing is greatest at C5-C6. There is ossification of the posterior longitudinal ligament at C5-C6 resulting in at least mild spinal canal stenosis. CTA HEAD: ANTERIOR CIRCULATION: No significant stenosis of the internal carotid arteries. No significant stenosis of the anterior cerebral arteries. No significant stenosis of the middle cerebral arteries. No aneurysm. POSTERIOR CIRCULATION: No significant stenosis of the posterior cerebral arteries. No significant stenosis of the basilar artery. No significant stenosis of the vertebral arteries. No aneurysm. OTHER: No dural venous sinus thrombosis on this non-dedicated study. IMPRESSION: 1. No large vessel occlusion, hemodynamically significant stenosis, or aneurysm in the head or neck. 2. Mild prominence of the main pulmonary artery, which can be seen with pulmonary arterial hypertension. 3. Mild bronchial wall thickening and upper lobe opacities, which may  reflect viral infection versus mild pulmonary edema. 4. Partially visualized irregular right breast soft tissue with surgical clips and suspected postbiopsy/posttreatment changes. Sequelae of right axillary lymph node biopsy. Recommend nonemergent correlation with prior mammogram and biopsy results. 5. Degenerative changes in the cervical spine. 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 Electronically signed by: Donnice Mania MD 09/05/2024 05:37 PM EST RP Workstation: [id number]   CT HEAD WO CONTRAST Result Date: 09/05/2024 CLINICAL DATA:  Altered mental status. EXAM: CT HEAD WITHOUT CONTRAST TECHNIQUE: Contiguous axial images were obtained from the base of the skull through the vertex without intravenous contrast. RADIATION DOSE REDUCTION: This exam was performed according to the departmental dose-optimization program which includes automated exposure control, adjustment of the mA and/or kV according to patient size and/or use of iterative reconstruction technique. COMPARISON:  None Available. FINDINGS: Brain: There is generalized cerebral atrophy with widening of the extra-axial spaces and ventricular dilatation. There are areas of decreased attenuation within the white matter tracts of the supratentorial brain, consistent with microvascular disease changes. Vascular: No hyperdense vessel or unexpected calcification. Skull: Normal. Negative for fracture or focal lesion. Sinuses/Orbits: No acute finding. Other: None. IMPRESSION: 1. Generalized cerebral atrophy and microvascular disease changes of the supratentorial brain. 2. No acute intracranial abnormality. Electronically Signed   By: Suzen Dials M.D.   On: 09/05/2024 17:16      Assessment/Plan Principal Problem:   TIA (transient ischemic attack) Active Problems:   Hypothyroidism   Essential hypertension   High cholesterol   Type 2 diabetes mellitus without complications  (HCC)    TIA -    appreciate neurology consult.  EKG, 2D echo lipid panel hemoglobin A1c pending.  Monitor shows sinus rhythm.  Patient passed stroke swallow screen.  On antiplatelet agents. Hypertension takes ARB.  Allow for permissive hypertension.  As needed IV hydralazine  for systolic more than 220 and diastolic more than 120. Hypothyroidism on Synthroid . History of breast cancer in remission being followed by oncologist. Possible CKD stage III.  Follow metabolic panel.  CT angiogram of the head and neck did show some abnormality in the lung field but patient is asymptomatic.  DVT prophylaxis: Lovenox . Code Status: Full code. Family Communication: Discussed with patient's daughter. Disposition Plan: Monitored bed. Consults called: Neurologist. Admission status: Observation.         [1]  Allergies Allergen Reactions   Statins Other (See Comments)    Medication Crestor:   Urine became very dark within two days.  Nerve damage and myopathy as a result.    Epinephrine Other (See Comments)   Hydrogen Peroxide Other (See Comments)    Blisters    Lidocaine  Other (See Comments)    Combination of Lidocaine  and Epinephrine during dental procedure caused her to nearly arrest.    Metrogel [Metronidazole] Other (See Comments)    Increase in vaginal burning.  Does better with Flagyl   Tamoxifen      Joint pain   Adhesive [Tape] Itching and Rash   Latex Rash   Neomycin-Bacitracin Zn-Polymyx Rash   Sulfa Antibiotics Rash   "

## 2024-09-06 NOTE — Consult Note (Incomplete)
 [name]

## 2024-09-06 NOTE — Inpatient Diabetes Management (Addendum)
 Inpatient Diabetes Program Recommendations  AACE/ADA: New Consensus Statement on Inpatient Glycemic Control (2015)  Target Ranges:  Prepandial:   less than 140 mg/dL      Peak postprandial:   less than 180 mg/dL (1-2 hours)      Critically ill patients:  140 - 180 mg/dL    Latest Reference Range & Units 09/06/24 02:23  Hemoglobin A1C 4.8 - 5.6 % 8.0 (H)  182 mg/dl  (H): Data is abnormally high  Latest Reference Range & Units 09/05/24 15:54 09/05/24 17:15 09/06/24 07:00  Glucose 70 - 99 mg/dL 857 (H) 864 (H) 878 (H)  (H): Data is abnormally high    Admit with: Blurred Vision/ TIA  History: HTN, Breast Cancer in Remission, Pre-Diabetes  Home DM Meds: None  Current Orders: None    Page to Dr. Leotis asking for Novolog  SSI and CBG checks  Will order Educational materials/ RD consult and plan to speak with pt and daughter today about new diagnosis diabetes   Addendum 12:15pm--Met w/ pt down in the ED.  Dtr and son also present.  Pt told me she had pre-diabetes.  Not sure what her Last A1c was.  Sees Dr. Gerome with [facility].  Pt told me she does not follow a diabetes diet and ate poorly over the holidays.  Spoke with pt about new diagnosis.  Discussed A1C results with pt and family and explained what an A1C is, basic pathophysiology of DM Type 2, basic home care, basic diabetes diet nutrition principles, importance of checking CBGs and maintaining good CBG control to prevent long-term and short-term complications.  Also reviewed blood sugar goals and A1c goals for home.  Discussed w/ pt that she may need some sort of oral DM medication for home but that the Attending MD will decide that upon d/c.  Also stressed to pt the importance of following up with PCP about new diagnosis.    RNs to provide ongoing basic DM education at bedside with this patient.  Have ordered educational booklet.  Have also placed RD consult for DM diet education for this  patient.     --Will follow patient during hospitalization--  Adina Rudolpho Arrow RN, MSN, CDCES Diabetes Coordinator Inpatient Glycemic Control Team Team Pager: [phone] (8a-5p)

## 2024-09-06 NOTE — Discharge Summary (Addendum)
 Physician Discharge Summary  Lisa Horton FMW:[id number] DOB: 08-Apr-1946 DOA: 09/05/2024  PCP: Gerome Brunet, DO  Admit date: 09/05/2024  Discharge date: 09/06/2024  Admitted From: Home  Disposition: Home  Recommendations for Outpatient Follow-up:  Follow up with PCP in 1-2 weeks. Please obtain BMP/CBC in one week. Advised to follow up Dr. Gregg in 4 weeks. Advised Aspirin  and Plavix  for 3 weeks and then Aspirin  monotherapy. 5.   Advised to take Zetia  daily for hypercholestrolemia.. 6.   Advised to take metformin  500 mg BID for new onset diabetes. 7.   Advised to follow up Endocrinology Dr. Dartha as scheduled.  Home Health:None Equipment/Devices:None  Discharge Condition: Stable CODE STATUS:Full code Diet recommendation:  Carb Modified Diet  Brief Summary/ Hospital Course: This 79 yrs old female with PMH significant for hypertension, hypothyroidism, breast cancer in remission , who was brought in the ED after daughter noted that patient was complaining of left-sided blurred vision and had some difficulty talking at home and felt that patient was also confused.  The whole episode lasted around 15 minutes following which it resolved.  Patient denies any weakness in upper and lower extremities.  In the ED MRI of the brain was negative for any acute stroke.  CT angiogram of head did not show any large vessel obstruction.  Neurology was consulted and recommended admission for TIA.  CVA workup completed. She denies any symptoms at this point.  LDL 111 above goal, started on Zetia  due to statin allergy ,  hemoglobin A1c 8.0 consistent with new onset diabetes. Patient reports having prediabetes before and has not been following proper diet. Diabetic coordinator consulted, started on metformin , advised to follow up Endocrinology in one week for new onset diabetes.  Neurology recommended DAPT ( Aspirin  + Plavix  ) for 3 weeks, followed by aspirin  monotherapy.Recommend aggressive stroke risk factor  modification. She follows with Dr. Gregg at [facility] Recommend follow-up with Dr. Gregg in 4 weeks, and also recommend ophthalmology follow-up as outpatient. PT and OT no recommendation. Patient feels better and wants to be discharged home.   Discharge Diagnoses:  Principal Problem:   TIA (transient ischemic attack) Active Problems:   Hypothyroidism   Essential hypertension   High cholesterol   Type 2 diabetes mellitus without complications [facility])    Discharge Instructions  Discharge Instructions     Call MD for:  difficulty breathing, headache or visual disturbances   Complete by: As directed    Call MD for:  persistant dizziness or light-headedness   Complete by: As directed    Call MD for:  persistant nausea and vomiting   Complete by: As directed    Diet Carb Modified   Complete by: As directed    Discharge instructions   Complete by: As directed    Advised to follow up PCP in one week. Advised to follow up Dr. Gregg in 4 weeks. Advised Aspirin  and Plavix  for 3 weeks and then Aspirin  monotherapy. Advised to take Zetia  daily for hypercholestrolemia. Advised to take metformin  500 mg BID for new onset diabetes. Advised to follow up Endocrinology Dr. Motwani as scheduled.   Increase activity slowly   Complete by: As directed       Allergies as of 09/06/2024       Reactions   Statins Other (See Comments)   Medication Crestor:   Urine became very dark within two days.   Nerve damage and myopathy as a result.    Epinephrine Other (See Comments)   Hydrogen Peroxide Other (See  Comments)   Blisters   Lidocaine  Other (See Comments)   Combination of Lidocaine  and Epinephrine during dental procedure caused her to nearly arrest.    Metrogel [metronidazole] Other (See Comments)   Increase in vaginal burning.  Does better with Flagyl   Tamoxifen     Joint pain   Adhesive [tape] Itching, Rash   Latex Rash   Neomycin-bacitracin Zn-polymyx Rash   Sulfa Antibiotics Rash         Medication List     STOP taking these medications    nitrofurantoin  (macrocrystal-monohydrate) 100 MG capsule Commonly known as: MACROBID        TAKE these medications    aspirin  EC 81 MG tablet Take 1 tablet (81 mg total) by mouth daily. Swallow whole. Start taking on: September 07, 2024   betamethasone  valerate ointment 0.1 % Commonly known as: VALISONE  Apply 1 Application topically 2 (two) times daily. Use for up to 2 weeks   cetirizine 10 MG tablet Commonly known as: ZYRTEC Take 10 mg by mouth daily.   clopidogrel  75 MG tablet Commonly known as: PLAVIX  Take 1 tablet (75 mg total) by mouth daily for 21 days. Start taking on: September 07, 2024   ezetimibe  10 MG tablet Commonly known as: ZETIA  Take 1 tablet (10 mg total) by mouth daily. Start taking on: September 07, 2024   irbesartan 300 MG tablet Commonly known as: AVAPRO Take 300 mg by mouth daily.   levothyroxine  125 MCG tablet Commonly known as: SYNTHROID  Take 125 mcg by mouth daily.   metFORMIN  500 MG tablet Commonly known as: GLUCOPHAGE  Take 1 tablet (500 mg total) by mouth 2 (two) times daily with a meal.   Vitamin D (Ergocalciferol) 1.25 MG (50000 UNIT) Caps capsule Commonly known as: DRISDOL Take 1 capsule by mouth once a week.        Follow-up Information     Gerome Brunet, DO. Call .   Specialty: Family Medicine Why: Follow up from ER visit Contact information: [address] [place] [address] [phone]         [facility] Emergency Department at [facility]. Go to .   Specialty: Emergency Medicine Why: As needed, If symptoms worsen Contact information: [address] [place]  [address] [phone]        Gregg Lek, MD Follow up in 4 week(s).   Specialty: Neurology Contact information: [address] [place] [address] [phone]         Dartha Ernst, MD Follow up in 1 week(s).   Specialty:  Endocrinology Why: New Onset Diabetes [place] Contact information: [address] [place] [address] [place] [address] [phone]                Allergies[1]  Consultations: Neurology   Procedures/Studies: ECHOCARDIOGRAM COMPLETE Result Date: 09/06/2024    ECHOCARDIOGRAM REPORT   Patient Name:   Lisa Horton Date of Exam: 09/06/2024 Medical Rec #:  [id number]       Height:       62.0 in Accession #:    [id number]      Weight:       212.1 lb Date of Birth:  11-Apr-1946      BSA:          1.960 m Patient Age:    78 years        BP:           105/89 mmHg Patient Gender: F  HR:           81 bpm. Exam Location:  Inpatient Procedure: 2D Echo, Cardiac Doppler, Color Doppler and Saline Contrast Bubble            Study (Both Spectral and Color Flow Doppler were utilized during            procedure). Indications:    Stroke  History:        Patient has no prior history of Echocardiogram examinations.                 Risk Factors:Hypertension and Diabetes.  Sonographer:    Odella Brewster Referring Phys: [id number] RAFAL M [facility]  Sonographer Comments: Image acquisition challenging due to patient body habitus and Image acquisition challenging due to respiratory motion. IMPRESSIONS  1. Left ventricular ejection fraction, by estimation, is 60 to 65%. The left ventricle has normal function. The left ventricle has no regional wall motion abnormalities. There is mild concentric left ventricular hypertrophy. Left ventricular diastolic parameters were normal.  2. Right ventricular systolic function is normal. The right ventricular size is normal. Tricuspid regurgitation signal is inadequate for assessing PA pressure.  3. The mitral valve is normal in structure. No evidence of mitral valve regurgitation. No evidence of mitral stenosis.  4. The aortic valve is normal in structure. Aortic valve regurgitation is not visualized. No aortic stenosis is present.  5. The inferior vena cava is normal in size with  greater than 50% respiratory variability, suggesting right atrial pressure of 3 mmHg.  6. Agitated saline contrast bubble study was negative, with no evidence of any interatrial shunt. FINDINGS  Left Ventricle: Left ventricular ejection fraction, by estimation, is 60 to 65%. The left ventricle has normal function. The left ventricle has no regional wall motion abnormalities. The left ventricular internal cavity size was normal in size. There is  mild concentric left ventricular hypertrophy. Left ventricular diastolic parameters were normal. Right Ventricle: The right ventricular size is normal. No increase in right ventricular wall thickness. Right ventricular systolic function is normal. Tricuspid regurgitation signal is inadequate for assessing PA pressure. Left Atrium: Left atrial size was normal in size. Right Atrium: Right atrial size was normal in size. Pericardium: There is no evidence of pericardial effusion. Presence of epicardial fat layer. Mitral Valve: The mitral valve is normal in structure. No evidence of mitral valve regurgitation. No evidence of mitral valve stenosis. MV peak gradient, 3.6 mmHg. The mean mitral valve gradient is 2.0 mmHg. Tricuspid Valve: The tricuspid valve is normal in structure. Tricuspid valve regurgitation is not demonstrated. No evidence of tricuspid stenosis. Aortic Valve: The aortic valve is normal in structure. Aortic valve regurgitation is not visualized. No aortic stenosis is present. Aortic valve mean gradient measures 3.0 mmHg. Aortic valve peak gradient measures 5.9 mmHg. Aortic valve area, by VTI measures 2.57 cm. Pulmonic Valve: The pulmonic valve was normal in structure. Pulmonic valve regurgitation is not visualized. No evidence of pulmonic stenosis. Aorta: The aortic root is normal in size and structure. Venous: The inferior vena cava is normal in size with greater than 50% respiratory variability, suggesting right atrial pressure of 3 mmHg. IAS/Shunts: No atrial  level shunt detected by color flow Doppler. Agitated saline contrast was given intravenously to evaluate for intracardiac shunting. Agitated saline contrast bubble study was negative, with no evidence of any interatrial shunt.  LEFT VENTRICLE PLAX 2D LVIDd:         4.10 cm     Diastology LVIDs:  2.90 cm     LV e' medial:    7.46 cm/s LV PW:         1.10 cm     LV E/e' medial:  9.2 LV IVS:        1.20 cm     LV e' lateral:   7.46 cm/s LVOT diam:     1.90 cm     LV E/e' lateral: 9.2 LV SV:         59 LV SV Index:   30 LVOT Area:     2.84 cm LV IVRT:       106 msec  LV Volumes (MOD) LV vol d, MOD A2C: 75.9 ml LV vol d, MOD A4C: 60.1 ml LV vol s, MOD A2C: 34.1 ml LV vol s, MOD A4C: 23.7 ml LV SV MOD A2C:     41.8 ml LV SV MOD A4C:     60.1 ml LV SV MOD BP:      40.0 ml RIGHT VENTRICLE RV S prime:     16.50 cm/s  PULMONARY VEINS TAPSE (M-mode): 2.0 cm      Diastolic Velocity: 48.90 cm/s                             S/D Velocity:       0.80                             Systolic Velocity:  39.10 cm/s LEFT ATRIUM             Index LA diam:        3.70 cm 1.89 cm/m LA Vol (A2C):   37.4 ml 19.08 ml/m LA Vol (A4C):   36.9 ml 18.83 ml/m LA Biplane Vol: 37.4 ml 19.08 ml/m  AORTIC VALVE                    PULMONIC VALVE AV Area (Vmax):    2.41 cm     PV Vmax:       0.83 m/s AV Area (Vmean):   2.47 cm     PV Peak grad:  2.8 mmHg AV Area (VTI):     2.57 cm AV Vmax:           121.00 cm/s AV Vmean:          88.000 cm/s AV VTI:            0.228 m AV Peak Grad:      5.9 mmHg AV Mean Grad:      3.0 mmHg LVOT Vmax:         103.00 cm/s LVOT Vmean:        76.800 cm/s LVOT VTI:          0.207 m LVOT/AV VTI ratio: 0.91  AORTA Ao Root diam: 3.30 cm Ao Asc diam:  3.00 cm MITRAL VALVE MV Area (PHT): 3.50 cm    SHUNTS MV Area VTI:   3.16 cm    Systemic VTI:  0.21 m MV Peak grad:  3.6 mmHg    Systemic Diam: 1.90 cm MV Mean grad:  2.0 mmHg MV Vmax:       0.95 m/s MV Vmean:      65.6 cm/s MV Decel Time: 217 msec MV E velocity: 69.00  cm/s MV A velocity: 91.80 cm/s MV E/A ratio:  0.75 Kardie Tobb DO Electronically signed by Kardie  Tobb DO Signature Date/Time: 09/06/2024/4:48:19 PM    Final    MR BRAIN WO CONTRAST Result Date: 09/05/2024 EXAM: MRI BRAIN WITHOUT CONTRAST 09/05/2024 07:51:39 PM TECHNIQUE: Multiplanar multisequence MRI of the head/brain was performed without the administration of intravenous contrast. COMPARISON: None available. CLINICAL HISTORY: Transient ischemic attack (TIA); ?TIA, left sided vision change, aphasia. Query transient ischemic attack (TIA), left-sided vision change, aphasia. FINDINGS: BRAIN AND VENTRICLES: No acute infarct. No intracranial hemorrhage. No mass. No midline shift. No hydrocephalus. Multifocal hyperintense T2-weighted signal within the cerebral white matter, most commonly due to chronic small vessel disease. The sella is unremarkable. Normal flow voids. ORBITS: No significant abnormality. SINUSES AND MASTOIDS: No significant abnormality. BONES AND SOFT TISSUES: Normal marrow signal. No soft tissue abnormality. IMPRESSION: 1. No acute infarct or other acute intracranial abnormality. 2. Multifocal T2 hyperintense signal in the cerebral white matter, most consistent with chronic small vessel disease. Electronically signed by: Franky Stanford MD 09/05/2024 08:01 PM EST RP Workstation: [id number]   DG Chest Portable 1 View Result Date: 09/05/2024 CLINICAL DATA:  Altered mental status. EXAM: PORTABLE CHEST 1 VIEW COMPARISON:  August 21, 2017 FINDINGS: The heart size and mediastinal contours are within normal limits. Mild bilateral peribronchial cuffing is noted. Mild atelectatic changes are suspected within the left lung base. No pleural effusion or pneumothorax is identified. Radiopaque surgical clips are seen overlying the right axilla. Multilevel degenerative changes are present throughout the thoracic spine. IMPRESSION: Mild left basilar atelectasis. Electronically Signed   By: Suzen Dials M.D.   On:  09/05/2024 18:05   CT ANGIO HEAD NECK W WO CM Result Date: 09/05/2024 EXAM: CTA HEAD AND NECK WITH AND WITHOUT 09/05/2024 05:07:15 PM TECHNIQUE: CTA of the head and neck was performed with and without the administration of 100 mL of iohexol  (OMNIPAQUE ) 350 MG/ML intravenous contrast. Multiplanar 2D and/or 3D reformatted images are provided for review. Automated exposure control, iterative reconstruction, and/or weight based adjustment of the mA/kV was utilized to reduce the radiation dose to as low as reasonably achievable. Stenosis of the internal carotid arteries measured using NASCET criteria. COMPARISON: CT head dated 09/05/2024 CLINICAL HISTORY: Transient ischemic attack (TIA). FINDINGS: CTA NECK: AORTIC ARCH AND ARCH VESSELS: Mild atherosclerosis of the aortic arch. No dissection or arterial injury. No significant stenosis of the brachiocephalic or subclavian arteries. Mild atherosclerosis of the proximal right subclavian artery without significant stenosis. CERVICAL CAROTID ARTERIES: No hemodynamically significant stenosis of the right cervical ICA. Atherosclerosis at the left carotid bifurcation without hemodynamically significant stenosis. No dissection or arterial injury. CERVICAL VERTEBRAL ARTERIES: No dissection, arterial injury, or significant stenosis. LUNGS AND MEDIASTINUM: Mild prominence of the main pulmonary artery measuring up to 3.2 cm in diameter which could reflect pulmonary arterial hypertension. There is mild bronchial wall thickening in the upper lobes with a few areas of opacity in the medial aspects of both upper lobes which could reflect viral infection versus mild pulmonary edema. SOFT TISSUES: Surgical clips in the upper outer quadrant of the right breast with partially visualized irregular soft tissue in correlation with history of mammogram and prior biopsy. There are also likely changes of prior right axillary lymph node biopsy. BONES: Degenerative changes in the visualized spine.  Disc space narrowing is greatest at C5-C6. There is ossification of the posterior longitudinal ligament at C5-C6 resulting in at least mild spinal canal stenosis. CTA HEAD: ANTERIOR CIRCULATION: No significant stenosis of the internal carotid arteries. No significant stenosis of the anterior cerebral arteries. No significant stenosis of the  middle cerebral arteries. No aneurysm. POSTERIOR CIRCULATION: No significant stenosis of the posterior cerebral arteries. No significant stenosis of the basilar artery. No significant stenosis of the vertebral arteries. No aneurysm. OTHER: No dural venous sinus thrombosis on this non-dedicated study. IMPRESSION: 1. No large vessel occlusion, hemodynamically significant stenosis, or aneurysm in the head or neck. 2. Mild prominence of the main pulmonary artery, which can be seen with pulmonary arterial hypertension. 3. Mild bronchial wall thickening and upper lobe opacities, which may reflect viral infection versus mild pulmonary edema. 4. Partially visualized irregular right breast soft tissue with surgical clips and suspected postbiopsy/posttreatment changes. Sequelae of right axillary lymph node biopsy. Recommend nonemergent correlation with prior mammogram and biopsy results. 5. Degenerative changes in the cervical spine. 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 Electronically signed by: Donnice Mania MD 09/05/2024 05:37 PM EST RP Workstation: [id number]   CT HEAD WO CONTRAST Result Date: 09/05/2024 CLINICAL DATA:  Altered mental status. EXAM: CT HEAD WITHOUT CONTRAST TECHNIQUE: Contiguous axial images were obtained from the base of the skull through the vertex without intravenous contrast. RADIATION DOSE REDUCTION: This exam was performed according to the departmental dose-optimization program which includes automated exposure control, adjustment of the mA and/or kV according to patient size and/or use  of iterative reconstruction technique. COMPARISON:  None Available. FINDINGS: Brain: There is generalized cerebral atrophy with widening of the extra-axial spaces and ventricular dilatation. There are areas of decreased attenuation within the white matter tracts of the supratentorial brain, consistent with microvascular disease changes. Vascular: No hyperdense vessel or unexpected calcification. Skull: Normal. Negative for fracture or focal lesion. Sinuses/Orbits: No acute finding. Other: None. IMPRESSION: 1. Generalized cerebral atrophy and microvascular disease changes of the supratentorial brain. 2. No acute intracranial abnormality. Electronically Signed   By: Suzen Dials M.D.   On: 09/05/2024 17:16   Subjective: Patient was seen and examined at bed side,  She reports feeling better, symptoms resolved and wants to be discharged home.  Discharge Exam: Vitals:   09/06/24 1620 09/06/24 1718  BP: (!) 145/76 (!) 146/86  Pulse:  75  Resp: (!) 21 (!) 21  Temp:  97.7 F (36.5 C)  SpO2: 100% 100%   Vitals:   09/06/24 1400 09/06/24 1500 09/06/24 1620 09/06/24 1718  BP: (!) 130/92 134/89 (!) 145/76 (!) 146/86  Pulse:  76  75  Resp:  17 (!) 21 (!) 21  Temp:  97.7 F (36.5 C)  97.7 F (36.5 C)  TempSrc:      SpO2: 100% 100% 100% 100%  Weight:      Height:        General: Pt is alert, awake, not in acute distress Cardiovascular: RRR, S1/S2 +, no rubs, no gallops Respiratory: CTA bilaterally, no wheezing, no rhonchi Abdominal: Soft, NT, ND, bowel sounds + Extremities: no edema, no cyanosis    The results of significant diagnostics from this hospitalization (including imaging, microbiology, ancillary and laboratory) are listed below for reference.     Microbiology: No results found for this or any previous visit (from the past 240 hours).   Labs: BNP (last 3 results) No results for input(s): BNP in the last 8760 hours. Basic Metabolic Panel: Recent Labs  Lab  09/05/24 1554 09/05/24 1715 09/06/24 0223 09/06/24 0700  NA 139 141  --  139  K 3.9 4.2  --  3.9  CL 103 104  --  104  CO2 29  --   --  25  GLUCOSE 142* 135*  --  121*  BUN 12 12  --  14  CREATININE 1.00 1.00 1.07* 1.00  CALCIUM 9.0  --   --  8.8*  MG 2.1  --   --   --    Liver Function Tests: Recent Labs  Lab 09/05/24 1554 09/06/24 0700  AST 19 18  ALT 16 15  ALKPHOS 76 62  BILITOT 0.8 0.8  PROT 7.5 6.9  ALBUMIN 4.0 3.8   No results for input(s): LIPASE, AMYLASE in the last 168 hours. No results for input(s): AMMONIA in the last 168 hours. CBC: Recent Labs  Lab 09/05/24 1554 09/05/24 1715 09/06/24 0223 09/06/24 0700  WBC 8.7  --  11.0* 10.5  NEUTROABS 5.9  --   --   --   HGB 12.5 12.2 13.6 12.2  HCT 39.3 36.0 41.7 38.4  MCV 95.4  --  93.1 95.0  PLT 234  --  256 219   Cardiac Enzymes: No results for input(s): CKTOTAL, CKMB, CKMBINDEX, TROPONINI in the last 168 hours. BNP: Invalid input(s): POCBNP CBG: Recent Labs  Lab 09/06/24 1236 09/06/24 1720  GLUCAP 179* 110*   D-Dimer No results for input(s): DDIMER in the last 72 hours. Hgb A1c Recent Labs    09/06/24 0223  HGBA1C 8.0*   Lipid Profile Recent Labs    09/06/24 0223  CHOL 194  HDL 58  LDLCALC 111*  TRIG 129  CHOLHDL 3.4   Thyroid  function studies No results for input(s): TSH, T4TOTAL, T3FREE, THYROIDAB in the last 72 hours.  Invalid input(s): FREET3 Anemia work up No results for input(s): VITAMINB12, FOLATE, FERRITIN, TIBC, IRON, RETICCTPCT in the last 72 hours. Urinalysis    Component Value Date/Time   BILIRUBINUR negative 07/13/2024 1511   BILIRUBINUR Negative 12/23/2022 1628   KETONESUR negative 07/13/2024 1511   PROTEINUR Negative 12/23/2022 1628   UROBILINOGEN 0.2 07/13/2024 1511   NITRITE Negative 07/13/2024 1511   NITRITE Negative 12/23/2022 1628   LEUKOCYTESUR Small (1+) (A) 07/13/2024 1511   Sepsis Labs Recent Labs  Lab  09/05/24 1554 09/06/24 0223 09/06/24 0700  WBC 8.7 11.0* 10.5   Microbiology No results found for this or any previous visit (from the past 240 hours).   Time coordinating discharge: Over 30 minutes  SIGNED:   Darcel Dawley, MD  [facility] 09/06/2024, 7:20 PM Pager   If 7PM-7AM, please contact night-coverage       [1]  Allergies Allergen Reactions   Statins Other (See Comments)    Medication Crestor:   Urine became very dark within two days.   Nerve damage and myopathy as a result.    Epinephrine Other (See Comments)   Hydrogen Peroxide Other (See Comments)    Blisters    Lidocaine  Other (See Comments)    Combination of Lidocaine  and Epinephrine during dental procedure caused her to nearly arrest.    Metrogel [Metronidazole] Other (See Comments)    Increase in vaginal burning.  Does better with Flagyl   Tamoxifen      Joint pain   Adhesive [Tape] Itching and Rash   Latex Rash   Neomycin-Bacitracin Zn-Polymyx Rash   Sulfa Antibiotics Rash

## 2024-09-06 NOTE — ED Notes (Signed)
 Hospitalist at the bedside

## 2024-09-06 NOTE — Progress Notes (Signed)
 PT Cancellation Note  Patient Details Name: Lisa Horton MRN: [id number] DOB: September 12, 1945   Cancelled Treatment:    Reason Eval/Treat Not Completed: PT screened, no needs identified, will sign off. Per OT pt at baseline with mobility and no PT needs.    Rodgers LELON [facility] 09/06/2024, 11:53 AM Rodgers Opal PT Acute [employer] [phone]

## 2024-09-06 NOTE — Plan of Care (Addendum)
 This 79 yrs old female with PMH significant for hypertension, hypothyroidism, breast cancer in remission , who was brought in the ED after daughter noted that patient was complaining of left-sided blurred vision and had some difficulty talking at home and felt that patient was also confused.  The whole episode lasted around 15 minutes following which it resolved.  Patient denies any weakness in upper and lower extremities.  In the ED MRI of the brain was negative for any acute stroke.  CT angiogram of head did not show any large vessel obstruction.  Neurology was consulted and recommended admission for TIA.  Workup in progress.  Patient was seen and examined at bedside.  She denies any symptoms at this point.  2D echo and carotid duplex will be obtained. Will allow for permissive hypertension.  LDL 111 above goal, hemoglobin A1c 8.0 consistent with diabetes.  Daughter at bedside, states she is at her baseline now.

## 2024-09-06 NOTE — ED Notes (Signed)
PT is at bedside.

## 2024-09-06 NOTE — Consult Note (Signed)
 NEUROLOGY CONSULT NOTE   Date of service: September 06, 2024 Patient Name: Lisa Horton MRN:  [id number] DOB:  03-06-1946 Chief Complaint:  [name] vision and confusion Requesting Provider: Griselda Norris, MD  History of Present Illness  Lisa Horton is a 79 y.o. female who experienced a chief complaint of blurry vision in the left eye over the left visual field associated with a speech disturbance suggestive of aphasia yesterday.  Symptoms were mild to moderate, course transient lasting about 15 minutes.  Patient denies any previous history of TIA, stroke, migraine equivalent.  ROS  Comprehensive ROS performed and pertinent positives documented in HPI    Past History   Past Medical History:  Diagnosis Date   Acid reflux    Allergic rhinitis    Arthritis    Asthma    Bruises easily    Cancer (HCC)    rt breast cancer   Chicken pox    Colon polyp    Family history of breast cancer    Family history of pancreatic cancer    Family history of prostate cancer    Gum disease    Hypertension    Hypothyroidism    Incontinence    Measles    Myopathy    arms and legs, statin drug related   Prediabetes 06/2014    Past Surgical History:  Procedure Laterality Date   ABDOMINAL HYSTERECTOMY  1986   secondary to fibroids   BREAST LUMPECTOMY WITH RADIOACTIVE SEED AND SENTINEL LYMPH NODE BIOPSY Right 03/09/2018   Procedure: RIGHT BREAST LUMPECTOMY WITH BRACKETED RADIOACTIVE SEED AND SENTINEL LYMPH NODE BIOPSY;  Surgeon: Ebbie Cough, MD;  Location: [facility];  Service: General;  Laterality: Right;   BREAST SURGERY  07/09/2008   mass removal   CHOLECYSTECTOMY  03/17/11   RE-EXCISION OF BREAST LUMPECTOMY Right 03/31/2018   Procedure: RE-EXCISION OF BREAST LUMPECTOMY;  Surgeon: Ebbie Cough, MD;  Location: [facility];  Service: General;  Laterality: Right;   SKIN TAG REMOVAL     brow and lid   THIGH / KNEE SOFT TISSUE BIOPSY  09/05/2009    TUBAL LIGATION  1980    Family History: Family History  Problem Relation Age of Onset   Hypertension Mother    Pancreatic cancer Mother 40   Hypertension Father    Prostate cancer Father 79   Emphysema Father    Hypertension Sister    Breast cancer Sister        dx >50, caught very early   Hypertension Brother    Alzheimer's disease Brother    Hypertension Sister    Heart failure Brother    Hypertension Brother    Breast cancer Daughter 6       BRCA1/2 negative, never had panel testing   Allergies Sister    Allergies Brother    Throat cancer Maternal Uncle     Social History  reports that she quit smoking about 16 years ago. Her smoking use included cigarettes. She started smoking about 64 years ago. She has a 48 pack-year smoking history. She has never used smokeless tobacco. She reports that she does not currently use alcohol. She reports that she does not use drugs.  Allergies[1]  Medications  Current Medications[2]  Vitals   Vitals:   09/05/24 2045 09/05/24 2155 09/05/24 2215 09/05/24 2230  BP: (!) 158/77  (!) 147/84 (!) 156/80  Pulse: 70  65 83  Resp: (!) 22  (!) 22 20  Temp:  98.4 F (  36.9 C)    TempSrc:      SpO2: 100%  100% 100%  Weight:      Height:        Body mass index is 38.79 kg/m.   Physical Exam   Constitutional: Appears well-developed and well-nourished.  Psych: Affect appropriate to situation.  Eyes: No scleral injection.  HENT: No OP obstruction.  Head: Normocephalic.  Cardiovascular: Normal rate and regular rhythm.  Respiratory: Effort normal, non-labored breathing.  GI: Soft.  No distension. There is no tenderness.  Skin: WDI.   Neurologic Examination   Patient is alert and oriented x 3.  Pupils are equal round and reactive to light.  Speech is clear.  Language is fluent.  Face is symmetric.  Extraocular movements are normal.  Visual fields are full to confrontation.  Tone is normal.  Strength is 5 out of 5 in all extremities.   Sensation is normal to light touch in the face and extremities.  Labs/Imaging/Neurodiagnostic studies   CBC:  Recent Labs  Lab October 05, 2024 1554 05-Oct-2024 1715  WBC 8.7  --   NEUTROABS 5.9  --   HGB 12.5 12.2  HCT 39.3 36.0  MCV 95.4  --   PLT 234  --    Basic Metabolic Panel:  Lab Results  Component Value Date   NA 141 10-05-24   K 4.2 October 05, 2024   CO2 29 10/05/2024   GLUCOSE 135 (H) 05-Oct-2024   BUN 12 2024-10-05   CREATININE 1.00 2024/10/05   CALCIUM 9.0 10-05-2024   GFRNONAA 58 (L) 10-05-2024   GFRAA 54 (L) 08/14/2019   Lipid Panel: No results found for: LDLCALC HgbA1c: No results found for: HGBA1C Urine Drug Screen:     Component Value Date/Time   LABOPIA NEGATIVE 10/05/24 2041   COCAINSCRNUR NEGATIVE 10-05-2024 2041   LABBENZ NEGATIVE 10-05-24 2041   AMPHETMU NEGATIVE 05-Oct-2024 2041   THCU NEGATIVE 10-05-2024 2041   LABBARB NEGATIVE 05-Oct-2024 2041    Alcohol Level     Component Value Date/Time   ETH <15 10/05/2024 1554   INR  Lab Results  Component Value Date   INR 1.2 10-05-24   APTT  Lab Results  Component Value Date   APTT 28 10-05-24     ASSESSMENT   TIA -this is a likely explanation for patient's episode of aphasia as well as visual disturbance.  There is uncertainty as to whether the symptoms were monocular or binocular.  If the symptoms were monocular then differential would include BRAO but this would not explain patient's speech disturbance.  Differential also includes migraine equivalent.  Patient's MRI of the brain as well as CT angiogram of head and neck are unremarkable.  Labs reviewed.  Patient is not on aspirin .  RECOMMENDATIONS  - Admit for workup of TIA - Echocardiogram routine - Start aspirin  81 mg p.o. daily - Lipid panel - Hemoglobin A1c - Neurology will follow  ______________________________________________________________________    Signed, Eugenie CHRISTELLA Levin, MD [facility]     [1]   Allergies Allergen Reactions   Statins Other (See Comments)    Medication Crestor:   Urine became very dark within two days.   Nerve damage and myopathy as a result.    Adhesive [Tape]    Epinephrine Other (See Comments)   Hydrogen Peroxide Other (See Comments)    Blisters    Lidocaine  Other (See Comments)    Combination of Lidocaine  and Epinephrine during dental procedure caused her to nearly arrest.    Metrogel [Metronidazole]  Other (See Comments)    Increase in vaginal burning.  Does better with Flagyl   Tamoxifen  Other (See Comments)   Latex Rash   Neomycin-Bacitracin Zn-Polymyx Rash   Sulfa Antibiotics Rash  [2] No current facility-administered medications for this encounter.  Current Outpatient Medications:    betamethasone  valerate ointment (VALISONE ) 0.1 %, Apply 1 Application topically 2 (two) times daily. Use for up to 2 weeks, Disp: 30 g, Rfl: 0   cetirizine (ZYRTEC) 10 MG tablet, Take 10 mg by mouth daily., Disp: , Rfl:    irbesartan (AVAPRO) 300 MG tablet, Take 300 mg by mouth daily. , Disp: , Rfl:    levothyroxine  (SYNTHROID ) 125 MCG tablet, Take 125 mcg by mouth daily., Disp: , Rfl:    nitrofurantoin , macrocrystal-monohydrate, (MACROBID ) 100 MG capsule, Take 1 capsule (100 mg total) by mouth 2 (two) times daily., Disp: 14 capsule, Rfl: 0   spironolactone (ALDACTONE) 50 MG tablet, Take 50 mg by mouth daily. Taking 1/2 pill daily, Disp: , Rfl:    Vitamin D, Ergocalciferol, (DRISDOL) 50000 units CAPS capsule, Take 1 capsule by mouth once a week., Disp: , Rfl: 3

## 2024-09-06 NOTE — ED Notes (Signed)
 Lab contacted for add ons.

## 2024-09-06 NOTE — Evaluation (Signed)
 Occupational Therapy Evaluation Patient Details Name: Lisa Horton MRN: [id number] DOB: [birth year] Today's Date: 09/06/2024   History of Present Illness   Pt is a 79 y.o. female presenting 2/3 with L sided blurry vision, confusion. MRI brain negative. Workup for TIA. PMH: HTN, hypothyroidism, breast CA.     Clinical Impressions PTA, pt living with daughter and independent. Upon eval, pt mod I for BADL, passed Short Blessed Test of cognition. Visual fields/saccades appear intact. Pt does demo additional eye shifts when tracking; daughter reports pt has cataracts which could potentially affect this assessment. Pt reading and locating items in hall without difficulty. Recommended follow up with ophthalmologist. No further acute OT needs identified. OT to sign off. Thank you for this order.      If plan is discharge home, recommend the following:   Other (comment) (on pt request; encouraged follow up with opthalmologist)     Functional Status Assessment   Patient has had a recent decline in their functional status and demonstrates the ability to make significant improvements in function in a reasonable and predictable amount of time.     Equipment Recommendations   None recommended by OT     Recommendations for Other Services         Precautions/Restrictions   Precautions Precautions: None Restrictions Weight Bearing Restrictions Per Provider Order: No     Mobility Bed Mobility Overal bed mobility: Modified Independent                  Transfers Overall transfer level: Modified independent                        Balance Overall balance assessment: Modified Independent (able to lift leg to flush toilet, step over obstacles, retrieve items from floor)                                         ADL either performed or assessed with clinical judgement   ADL Overall ADL's : Modified independent                                        General ADL Comments: donned socks, took self to restroom, etc without LOB     Vision Baseline Vision/History: 1 Wears glasses Ability to See in Adequate Light: 0 Adequate Patient Visual Report: No change from baseline (pt reports L eye vision loss has resolved) Vision Assessment?: Yes Eye Alignment: Within Functional Limits Ocular Range of Motion: Within Functional Limits Alignment/Gaze Preference: Within Defined Limits Tracking/Visual Pursuits:  (decreased overall smoothness of tracking with additional eye shifts but not necessarily losing stimulus.) Saccades: Within functional limits Convergence: Impaired (comment) (decr overall convergence)     Perception         Praxis         Pertinent Vitals/Pain Pain Assessment Pain Assessment: No/denies pain     Extremity/Trunk Assessment Upper Extremity Assessment Upper Extremity Assessment: Right hand dominant;Overall WFL for tasks assessed (no noted coordination or strength changes, denies tingling or numbness)   Lower Extremity Assessment Lower Extremity Assessment: Overall WFL for tasks assessed       Communication Communication Communication: No apparent difficulties   Cognition Arousal: Alert Behavior During Therapy: WFL for tasks assessed/performed Cognition: No apparent impairments  OT - Cognition Comments: performing basic problem solving tasks, passed short blessed test.                 Following commands: Intact       Cueing  General Comments   Cueing Techniques: Verbal cues  VSS   Exercises     Shoulder Instructions      Home Living Family/patient expects to be discharged to:: Private residence Living Arrangements: Children (daughter) Available Help at Discharge: Family;Available PRN/intermittently Type of Home: House Home Access: Stairs to enter [employer] of Steps: 2 Entrance Stairs-Rails:  (rails in back but goes in front) Home  Layout: One level     Bathroom Shower/Tub: Chief Strategy Officer: Standard     Home Equipment: None          Prior Functioning/Environment Prior Level of Function : Independent/Modified Independent;Driving             Mobility Comments: no AD ADLs Comments: mod I for BADL    OT Problem List: Impaired vision/perception   OT Treatment/Interventions:        OT Goals(Current goals can be found in the care plan section)   Acute Rehab OT Goals Patient Stated Goal: go home OT Goal Formulation: With patient Time For Goal Achievement: 09/20/24 Potential to Achieve Goals: Good   OT Frequency:       Co-evaluation              AM-PAC OT 6 Clicks Daily Activity     Outcome Measure Help from another person eating meals?: None Help from another person taking care of personal grooming?: None Help from another person toileting, which includes using toliet, bedpan, or urinal?: None Help from another person bathing (including washing, rinsing, drying)?: None Help from another person to put on and taking off regular upper body clothing?: None Help from another person to put on and taking off regular lower body clothing?: None 6 Click Score: 24   End of Session Equipment Utilized During Treatment: Gait belt Nurse Communication: Mobility status  Activity Tolerance: Patient tolerated treatment well Patient left: in bed;with call bell/phone within reach;with family/visitor present  OT Visit Diagnosis: Low vision, both eyes (H54.2)                Time: 8875-8854 OT Time Calculation (min): 21 min Charges:  OT General Charges $OT Visit: 1 Visit OT Evaluation $OT Eval Low Complexity: 1 Low  Elma JONETTA Lebron FREDERICK, OTR/L [facility] Acute Rehabilitation Office: [phone]   Elma JONETTA Lebron 09/06/2024, 2:31 PM

## 2024-09-06 NOTE — Discharge Instructions (Signed)
 Advised to follow up PCP in one week. Advised to follow up Dr. Gregg in 4 weeks. Advised Aspirin  and Plavix  for 3 weeks and then Aspirin  monotherapy. Advised to take Zetia  daily for hypercholestrolemia. Advised to take metformin  500 mg BID for new onset diabetes. Advised to follow up Endocrinology Dr. Motwani as scheduled.

## 2024-09-06 NOTE — Plan of Care (Signed)
 Note reviewed.  For details, please see the same-day consult by Dr. Rockney.  In summary, patient 79 year old female with history of hypertension, prediabetes, breast cancer admitted for left eye blurriness and difficulty speaking for 15 close stroke minutes.  Currently back to baseline.  CT no acute abnormality.  CTA head and neck unremarkable.  MRI negative.  2D echo pending read.  LDL 111, A1c 8.0, UDS negative.  ESR 26 and CRP negative.  Patient symptoms concerning for TIA in the setting of uncontrolled risk factors.  Put on aspirin  and Plavix  DAPT for 3 weeks and then aspirin  alone.  Put on Zetia  given statin intolerance.  Recommend aggressive stroke risk factor modification.  She follows with Dr. Gregg at [facility].  Recommend follow-up with Dr. Gregg in 4 weeks, and also recommend ophthalmology follow-up as outpatient.  PT and OT no recommendation. Discussed with Dr. Leotis.  Neurology will sign off. Please call with questions. Pt will follow up with Dr. Gregg at [facility] in about 4 weeks. Thanks for the consult.  Ary Cummins, MD PhD Stroke Neurology 09/06/2024 2:46 PM

## 2025-04-12 ENCOUNTER — Other Ambulatory Visit

## 2025-04-12 ENCOUNTER — Ambulatory Visit: Admitting: Hematology
